# Patient Record
Sex: Male | Born: 1970 | Race: White | Hispanic: No | Smoking: Light tobacco smoker
Health system: Southern US, Community
[De-identification: ages and names within clinical notes are randomized; demographics above are authoritative.]

## PROBLEM LIST (undated history)

## (undated) DIAGNOSIS — R011 Cardiac murmur, unspecified: Secondary | ICD-10-CM

## (undated) DIAGNOSIS — I10 Essential (primary) hypertension: Secondary | ICD-10-CM

## (undated) DIAGNOSIS — E1165 Type 2 diabetes mellitus with hyperglycemia: Secondary | ICD-10-CM

## (undated) DIAGNOSIS — Z794 Long term (current) use of insulin: Secondary | ICD-10-CM

## (undated) DIAGNOSIS — M5416 Radiculopathy, lumbar region: Secondary | ICD-10-CM

## (undated) DIAGNOSIS — M79605 Pain in left leg: Secondary | ICD-10-CM

## (undated) DIAGNOSIS — R072 Precordial pain: Principal | ICD-10-CM

## (undated) DIAGNOSIS — M545 Low back pain, unspecified: Secondary | ICD-10-CM

## (undated) DIAGNOSIS — Z1211 Encounter for screening for malignant neoplasm of colon: Secondary | ICD-10-CM

## (undated) DIAGNOSIS — R0789 Other chest pain: Principal | ICD-10-CM

## (undated) DIAGNOSIS — E782 Mixed hyperlipidemia: Principal | ICD-10-CM

## (undated) DIAGNOSIS — E118 Type 2 diabetes mellitus with unspecified complications: Principal | ICD-10-CM

## (undated) DIAGNOSIS — N529 Male erectile dysfunction, unspecified: Secondary | ICD-10-CM

## (undated) DIAGNOSIS — I2510 Atherosclerotic heart disease of native coronary artery without angina pectoris: Principal | ICD-10-CM

## (undated) DIAGNOSIS — R9431 Abnormal electrocardiogram [ECG] [EKG]: Principal | ICD-10-CM

## (undated) DIAGNOSIS — I25119 Atherosclerotic heart disease of native coronary artery with unspecified angina pectoris: Secondary | ICD-10-CM

## (undated) DIAGNOSIS — R52 Pain, unspecified: Secondary | ICD-10-CM

## (undated) DIAGNOSIS — E1121 Type 2 diabetes mellitus with diabetic nephropathy: Principal | ICD-10-CM

## (undated) DIAGNOSIS — E139 Other specified diabetes mellitus without complications: Principal | ICD-10-CM

## (undated) DIAGNOSIS — D649 Anemia, unspecified: Secondary | ICD-10-CM

## (undated) HISTORY — DX: Cardiac murmur, unspecified: R01.1

## (undated) HISTORY — DX: Essential (primary) hypertension: I10

---

## 2006-05-04 ENCOUNTER — Ambulatory Visit: Payer: Self-pay | Admitting: Family Medicine

## 2006-05-04 DIAGNOSIS — E669 Obesity, unspecified: Secondary | ICD-10-CM | POA: Insufficient documentation

## 2006-05-04 DIAGNOSIS — I10 Essential (primary) hypertension: Secondary | ICD-10-CM | POA: Insufficient documentation

## 2006-05-05 ENCOUNTER — Encounter: Payer: Self-pay | Admitting: Family Medicine

## 2006-05-06 ENCOUNTER — Encounter: Payer: Self-pay | Admitting: Family Medicine

## 2006-05-06 LAB — CONVERTED CEMR LAB
BUN: 16 mg/dL (ref 6–23)
CO2: 24 meq/L (ref 19–32)
Calcium: 9.5 mg/dL (ref 8.4–10.5)
Chloride: 105 meq/L (ref 96–112)
Cholesterol: 200 mg/dL (ref 0–200)
Creatinine, Ser: 0.81 mg/dL (ref 0.40–1.50)
Glucose, Bld: 110 mg/dL — ABNORMAL HIGH (ref 70–99)
HDL: 29 mg/dL — ABNORMAL LOW (ref >39)
Total Bilirubin: 0.5 mg/dL (ref 0.3–1.2)
Total CHOL/HDL Ratio: 6.9
Triglycerides: 208 mg/dL — ABNORMAL HIGH (ref <150)
VLDL: 42 mg/dL — ABNORMAL HIGH (ref 0–40)

## 2006-05-23 ENCOUNTER — Encounter: Payer: Self-pay | Admitting: Family Medicine

## 2006-05-23 ENCOUNTER — Encounter: Admission: RE | Admit: 2006-05-23 | Discharge: 2006-08-21 | Payer: Self-pay | Admitting: Family Medicine

## 2006-08-24 ENCOUNTER — Ambulatory Visit: Payer: Self-pay | Admitting: Family Medicine

## 2006-08-24 DIAGNOSIS — H5710 Ocular pain, unspecified eye: Secondary | ICD-10-CM | POA: Insufficient documentation

## 2007-05-11 ENCOUNTER — Ambulatory Visit: Payer: Self-pay | Admitting: Family Medicine

## 2007-05-11 DIAGNOSIS — E1110 Type 2 diabetes mellitus with ketoacidosis without coma: Secondary | ICD-10-CM | POA: Insufficient documentation

## 2007-05-11 DIAGNOSIS — R7309 Other abnormal glucose: Secondary | ICD-10-CM | POA: Insufficient documentation

## 2007-05-12 ENCOUNTER — Ambulatory Visit: Payer: Self-pay | Admitting: Family Medicine

## 2007-05-12 DIAGNOSIS — R7301 Impaired fasting glucose: Secondary | ICD-10-CM | POA: Insufficient documentation

## 2007-05-12 LAB — CONVERTED CEMR LAB: Blood Glucose, Fasting: 109 mg/dL

## 2007-06-09 ENCOUNTER — Ambulatory Visit: Payer: Self-pay | Admitting: Family Medicine

## 2007-06-09 ENCOUNTER — Telehealth (INDEPENDENT_AMBULATORY_CARE_PROVIDER_SITE_OTHER): Payer: Self-pay | Admitting: *Deleted

## 2007-07-05 ENCOUNTER — Encounter: Admission: RE | Admit: 2007-07-05 | Discharge: 2007-07-05 | Payer: Self-pay | Admitting: Family Medicine

## 2007-09-01 ENCOUNTER — Ambulatory Visit: Payer: Self-pay | Admitting: Family Medicine

## 2007-09-01 LAB — CONVERTED CEMR LAB
Creatinine,U: 300 mg/dL
Hgb A1c MFr Bld: 5.8 %

## 2007-09-04 LAB — CONVERTED CEMR LAB
Cholesterol: 201 mg/dL — ABNORMAL HIGH (ref 0–200)
HDL goal, serum: 40 mg/dL
HDL: 32 mg/dL — ABNORMAL LOW (ref >39)
LDL Cholesterol: 124 mg/dL — ABNORMAL HIGH (ref 0–99)
LDL Goal: 130 mg/dL
Triglycerides: 223 mg/dL — ABNORMAL HIGH (ref <150)

## 2008-01-01 ENCOUNTER — Ambulatory Visit: Payer: Self-pay | Admitting: Family Medicine

## 2008-01-19 ENCOUNTER — Ambulatory Visit: Payer: Self-pay | Admitting: Family Medicine

## 2008-01-19 LAB — CONVERTED CEMR LAB: Blood Glucose, Fasting: 125 mg/dL

## 2008-02-15 ENCOUNTER — Ambulatory Visit: Payer: Self-pay | Admitting: Family Medicine

## 2010-07-27 ENCOUNTER — Encounter: Payer: Self-pay | Admitting: Family Medicine

## 2010-07-27 ENCOUNTER — Ambulatory Visit (INDEPENDENT_AMBULATORY_CARE_PROVIDER_SITE_OTHER): Payer: BC Managed Care – PPO | Admitting: Family Medicine

## 2010-07-27 DIAGNOSIS — E669 Obesity, unspecified: Secondary | ICD-10-CM

## 2010-07-27 DIAGNOSIS — I10 Essential (primary) hypertension: Secondary | ICD-10-CM

## 2010-07-27 DIAGNOSIS — IMO0001 Reserved for inherently not codable concepts without codable children: Secondary | ICD-10-CM

## 2010-07-27 DIAGNOSIS — R7309 Other abnormal glucose: Secondary | ICD-10-CM

## 2010-07-27 MED ORDER — METFORMIN HCL 500 MG PO TABS: 500.0000 mg | ORAL_TABLET | Freq: Two times a day (BID) | ORAL | Status: DC

## 2010-07-27 MED ORDER — LISINOPRIL-HYDROCHLOROTHIAZIDE 20-25 MG PO TABS: 1.0000 | ORAL_TABLET | Freq: Every day | ORAL | Status: DC

## 2010-07-27 NOTE — Assessment & Plan Note (Signed)
  We discussed the importance of weight loss on his sugars and blood pressure. I encouraged healthy diet and exercise.

## 2010-07-27 NOTE — Assessment & Plan Note (Signed)
Not at goal. Discussed restarting his medication. He didn't have any SE on the previsou med. Restart and recheck in 1 mo. Lap slip given to check renal function.

## 2010-07-27 NOTE — Assessment & Plan Note (Addendum)
Discussed new dx. H.O given. Will sched for diabetic classes. Discussed diet, exercise, weight loss. F/U in one mo with glucometer. Check sugar in AM daily before breakfast. He has meter, etc. Will start metformin. Check monofilament and foot exam at next visit. Can give pneumococcal at next ov.

## 2010-07-27 NOTE — Progress Notes (Signed)
  Subjective:    Patient ID: Larry Lawson, male    DOB: [birth year], 40 y.o.   MRN: [id number]  HPI He is here to follow up. Hasn't been seen since 2009. He was pre-diabetic at the time. He was supposed to f/u in 2 months and never did. Has been out of his BP med for a long time. No CP, SOB, palps.  He has had some increased thirst and urination.  He had labs check for insurance about 2 weeks ago and and was told his sugar was high. He started checking it at home and it was in the 200-300 range. Says has been eating a lot of ice cream lately.  He is also overweight. He is taking phentermine on and off.   Review of Systems     Objective:   Physical Exam  Constitutional: He is oriented to person, place, and time. He appears well-developed and well-nourished.  HENT:  Head: Normocephalic and atraumatic.  Eyes: Conjunctivae are normal. Pupils are equal, round, and reactive to light.  Neck: Neck supple. No thyromegaly present.  Cardiovascular: Normal rate, regular rhythm and normal heart sounds.        No carotid bruits.   Pulmonary/Chest: Effort normal and breath sounds normal.  Musculoskeletal: He exhibits no edema.  Lymphadenopathy:    He has no cervical adenopathy.  Neurological: He is alert and oriented to person, place, and time.  Skin: Skin is warm and dry.  Psychiatric: He has a normal mood and affect.          Assessment & Plan:

## 2010-08-23 ENCOUNTER — Encounter: Payer: Self-pay | Admitting: Family Medicine

## 2010-08-24 ENCOUNTER — Ambulatory Visit (INDEPENDENT_AMBULATORY_CARE_PROVIDER_SITE_OTHER): Payer: BC Managed Care – PPO | Admitting: Family Medicine

## 2010-08-24 ENCOUNTER — Encounter: Payer: Self-pay | Admitting: Family Medicine

## 2010-08-24 DIAGNOSIS — I10 Essential (primary) hypertension: Secondary | ICD-10-CM

## 2010-08-24 DIAGNOSIS — IMO0001 Reserved for inherently not codable concepts without codable children: Secondary | ICD-10-CM

## 2010-08-24 MED ORDER — SITAGLIP PHOS-METFORMIN HCL ER 100-1000 MG PO TB24: 100.0000 mg | ORAL_TABLET | Freq: Every day | ORAL | Status: DC

## 2010-08-24 MED ORDER — PNEUMOCOCCAL VAC POLYVALENT 25 MCG/0.5ML IJ INJ
0.5000 mL | INJECTION | Freq: Once | INTRAMUSCULAR | Status: AC
  Administered 2010-08-24: 0.5 mL via INTRAMUSCULAR

## 2010-08-24 MED ORDER — AMBULATORY NON FORMULARY MEDICATION: Status: DC

## 2010-08-24 NOTE — Assessment & Plan Note (Signed)
Improved today.  DBP not at goal.  Work on weight loss and diet and let recheck in 2 months. If still not at goal will need to adjust his medication.

## 2010-08-24 NOTE — Progress Notes (Signed)
  Subjective:    Patient ID: TAHJI Markham, male    DOB: [birth year], 40 y.o.   MRN: [id number]  Diabetes He presents for his follow-up diabetic visit. He has type 2 diabetes mellitus. Associated symptoms include fatigue. Pertinent negatives for diabetes include no polydipsia, no polyuria and no weight loss. There are no hypoglycemic complications. Symptoms are stable. When asked about current treatments, none were reported. He is compliant with treatment most of the time. His weight is stable. He has had a previous visit with a dietician. 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 ) participates in exercise. His breakfast blood glucose range is generally >200 mg/dl. An ACE inhibitor/angiotensin II receptor blocker is being taken.  Hypertension    He did report inc fatigue when his sugars were higher.   Review of Systems  Constitutional: Positive for fatigue. Negative for weight loss.  Genitourinary: Negative for polyuria.  Hematological: Negative for polydipsia.       Objective:   Physical Exam  Constitutional: He appears well-developed and well-nourished.  HENT:  Head: Normocephalic and atraumatic.  Cardiovascular: Normal rate, regular rhythm and normal heart sounds.   Pulmonary/Chest: Effort normal and breath sounds normal.  Skin: Skin is warm and dry.  Psychiatric: He has a normal mood and affect.          Assessment & Plan:

## 2010-08-24 NOTE — Assessment & Plan Note (Signed)
Given rx for glucometer and strips.  Will inc metformin to 1000 and add [place]. Given coupon card. F/u in 2 months. Monitor for inc diarrhea. Make sure taking with food.  He has missed some dosing because it is bid so I think the XR will help him.  Monofilament exam is nl.  Given pneumonia vaccine today.

## 2010-08-24 NOTE — Progress Notes (Signed)
Addended by: Burna Mortimer E on: 08/24/2010 10:04 AM   Modules accepted: Orders

## 2010-10-26 ENCOUNTER — Encounter: Payer: Self-pay | Admitting: Family Medicine

## 2010-10-26 ENCOUNTER — Ambulatory Visit (INDEPENDENT_AMBULATORY_CARE_PROVIDER_SITE_OTHER): Payer: BC Managed Care – PPO | Admitting: Family Medicine

## 2010-10-26 DIAGNOSIS — E119 Type 2 diabetes mellitus without complications: Secondary | ICD-10-CM

## 2010-10-26 DIAGNOSIS — IMO0001 Reserved for inherently not codable concepts without codable children: Secondary | ICD-10-CM

## 2010-10-26 LAB — POCT GLYCOSYLATED HEMOGLOBIN (HGB A1C): Hemoglobin A1C: 8.2

## 2010-10-26 MED ORDER — AMBULATORY NON FORMULARY MEDICATION: Status: DC

## 2010-10-26 NOTE — Assessment & Plan Note (Signed)
Sig improved on the medication esp since didn't make any major dietary changes. Discussed continue current regime and really focus on exercise, diet and weight loss in the next 3 months. F/U in 3 months.

## 2010-10-26 NOTE — Patient Instructions (Signed)
Continue to work on diet, exercise and weight loss.

## 2010-10-26 NOTE — Progress Notes (Signed)
  Subjective:    Patient ID: Larry Lawson, male    DOB: [birth year], 41 y.o.   MRN: [id number]  HPI DM - Has had more beer than usual as has  Been working out of town.  Has tried to cut back on sugar drinks, but he hasn't really made any major dietary changes.  He is taking the Jainumet but says it is very expensive. He lost the coupon card I gave him..they called him about the diabetic classes.  Did go to the class her in the building.    He says it was a one half hour class and felt like he was somewhat helpful. He says he will be traveling less over the next couple months and plans on starting a more active regimen to help control his blood sugars and get more exercise.   HTN- BP looks great. He says is only takting the lisinopril and not the toprol.   Review of Systems     Objective:   Physical Exam  Constitutional: He is oriented to person, place, and time. He appears well-developed and well-nourished.  Neck: Neck supple. No thyromegaly present.  Cardiovascular: Normal rate, regular rhythm and normal heart sounds.   Pulmonary/Chest: Effort normal and breath sounds normal.  Lymphadenopathy:    He has no cervical adenopathy.  Neurological: He is alert and oriented to person, place, and time.  Skin: Skin is warm and dry.  Psychiatric: He has a normal mood and affect. His behavior is normal.          Assessment & Plan: Patient was actually given a pneumococcal vaccine. He is alert he had this. I reassured her that there should be no negative effects from this. Also he will not be charged.   Note the patient was accidentally given another pneumonia vaccine today. He is already up to date. I reassured him there should be no negative effects from this. He also should not be charged for this.

## 2011-01-25 ENCOUNTER — Ambulatory Visit (INDEPENDENT_AMBULATORY_CARE_PROVIDER_SITE_OTHER): Payer: BC Managed Care – PPO | Admitting: Family Medicine

## 2011-01-25 ENCOUNTER — Encounter: Payer: Self-pay | Admitting: Family Medicine

## 2011-01-25 ENCOUNTER — Telehealth: Payer: Self-pay | Admitting: Family Medicine

## 2011-01-25 DIAGNOSIS — E119 Type 2 diabetes mellitus without complications: Secondary | ICD-10-CM

## 2011-01-25 DIAGNOSIS — F39 Unspecified mood [affective] disorder: Secondary | ICD-10-CM

## 2011-01-25 DIAGNOSIS — I10 Essential (primary) hypertension: Secondary | ICD-10-CM

## 2011-01-25 LAB — POCT UA - MICROALBUMIN

## 2011-01-25 LAB — POCT GLYCOSYLATED HEMOGLOBIN (HGB A1C): Hemoglobin A1C: 9.1

## 2011-01-25 MED ORDER — CIPROFLOXACIN HCL 0.3 % OP SOLN: 1.0000 [drp] | OPHTHALMIC | Status: AC

## 2011-01-25 MED ORDER — METOPROLOL SUCCINATE ER 25 MG PO TB24: 25.0000 mg | ORAL_TABLET | Freq: Every day | ORAL | Status: DC

## 2011-01-25 NOTE — Telephone Encounter (Signed)
Call patient in see if he would be okay with me calling a medication for his mood. If we do this and I'll need to see him back in a month to make sure it's helping him and to adjust his dose if needed. I meant to give him a mood questionnaire and PHQ-9 and forgot. We can certainly mail this to him if he would like to fill it out and drop it off.

## 2011-01-25 NOTE — Progress Notes (Signed)
  Subjective:    Patient ID: Larry Lawson, male    DOB: 08/10/1970, 40 y.o.   MRN: [id number]  Diabetes He presents for his follow-up diabetic visit. He has type 2 diabetes mellitus. His disease course has been worsening. There are no hypoglycemic associated symptoms. Pertinent negatives for diabetes include no chest pain, no foot paresthesias, no foot ulcerations, no polydipsia, no polyphagia, no polyuria, no visual change and no weight loss. When asked about current treatments, none were reported. He is compliant with treatment most of the time. He is following a generally unhealthy diet. He rarely participates in exercise. His home blood glucose trend is increasing steadily.  Hypertension This is a chronic problem. The current episode started more than 1 year ago. The problem is uncontrolled. Pertinent negatives include no chest pain. Past treatments include diuretics and ACE inhibitors. Compliance problems include diet.    He is also concerned about his mood today. He has been more able and angry. He most wonders if he could be bipolar. He has never been formally diagnosed with this.   Review of Systems  Constitutional: Negative for weight loss.  Cardiovascular: Negative for chest pain.  Genitourinary: Negative for polyuria.  Hematological: Negative for polydipsia and polyphagia.       Objective:   Physical Exam  Constitutional: He is oriented to person, place, and time. He appears well-developed and well-nourished.  HENT:  Head: Normocephalic and atraumatic.  Eyes: Conjunctivae are normal. Pupils are equal, round, and reactive to light.  Cardiovascular: Normal rate, regular rhythm and normal heart sounds.   Pulmonary/Chest: Effort normal and breath sounds normal.  Neurological: He is alert and oriented to person, place, and time.  Skin: Skin is warm and dry.  Psychiatric: He has a normal mood and affect. His behavior is normal.          Assessment & Plan:  DM- I really  wanted to add insulin to his regimen but he really didn't want to do this. He wants 3 months to really work on exercise and diet.  Normal Urine micro. I reviewed the consequences of poorly controlled sugars Lab Results  Component Value Date   HGBA1C 8.2 10/26/2010   HTN - Not well controlled. Will add metoprolol to his regimen. F/U in 3 months.    Mood disorder, unspecified-I would like to start by getting him in for counseling. We can also work on getting him in with psychiatry for further evaluation.

## 2011-01-25 NOTE — Patient Instructions (Signed)
[email or web address] Really work on exercise for 30 min for 5 days per week.

## 2011-01-26 LAB — LIPID PANEL
Cholesterol: 175 mg/dL (ref 0–200)
HDL: 27 mg/dL — ABNORMAL LOW (ref >39)
Total CHOL/HDL Ratio: 6.5 Ratio
Triglycerides: 343 mg/dL — ABNORMAL HIGH (ref <150)

## 2011-01-27 LAB — COMPLETE METABOLIC PANEL WITH GFR
ALT: 19 U/L (ref 0–53)
AST: 17 U/L (ref 0–37)
Alkaline Phosphatase: 63 U/L (ref 39–117)
Creat: 0.99 mg/dL (ref 0.50–1.35)
Total Bilirubin: 0.5 mg/dL (ref 0.3–1.2)

## 2011-02-03 NOTE — Telephone Encounter (Signed)
Left message for pt to call back  °

## 2011-02-16 ENCOUNTER — Ambulatory Visit (HOSPITAL_COMMUNITY): Payer: BC Managed Care – PPO | Admitting: Behavioral Health

## 2011-03-10 NOTE — Telephone Encounter (Signed)
Left message to call back  

## 2011-03-17 ENCOUNTER — Ambulatory Visit (INDEPENDENT_AMBULATORY_CARE_PROVIDER_SITE_OTHER): Payer: BC Managed Care – PPO | Admitting: Family Medicine

## 2011-03-17 ENCOUNTER — Other Ambulatory Visit: Payer: Self-pay | Admitting: *Deleted

## 2011-03-17 DIAGNOSIS — F329 Major depressive disorder, single episode, unspecified: Secondary | ICD-10-CM

## 2011-03-17 DIAGNOSIS — F3289 Other specified depressive episodes: Secondary | ICD-10-CM

## 2011-03-17 DIAGNOSIS — F32A Depression, unspecified: Secondary | ICD-10-CM | POA: Insufficient documentation

## 2011-03-17 DIAGNOSIS — R0789 Other chest pain: Secondary | ICD-10-CM

## 2011-03-17 MED ORDER — LISINOPRIL-HYDROCHLOROTHIAZIDE 20-25 MG PO TABS: 1.0000 | ORAL_TABLET | Freq: Every day | ORAL | Status: DC

## 2011-03-17 MED ORDER — FLUOXETINE HCL 10 MG PO CAPS: ORAL_CAPSULE | ORAL | Status: DC

## 2011-03-17 NOTE — Telephone Encounter (Signed)
Pt came in the office today and he filled out PHQ9 and is prob ok with being on medication

## 2011-03-17 NOTE — Progress Notes (Addendum)
Patient ID: Larry Lawson, male   DOB: June 11, 1970, 41 y.o.   MRN: [id number] Pt walked in stating his BP was elevated and he was having mild chest pain. After discussing medications, we realized that Pt was confused and has only been taking Metoprolol and stopped lisinopril. I advised Pt to restart lisinopril and continue all other meds. Pt agreed. BP was elevated today. EKG was normal. Pt advised to keep upcoming f/u apt and if chest pain or BP does not improve call our office or to ED. Pt agreed.     HTN- Not well controlled. Take both meds adn keep f/u Atypical CP- EKG shows rate of 73 bpm, NSR, no acute changes  Depression - PHQ- 9 score of 11, no suicidal thoughts.  I strongly recommend starting a med for depression as we had discussed at last office visit. Will start fluoxetins and keep f/u.   LMOM informing Pt

## 2011-04-02 ENCOUNTER — Encounter: Payer: Self-pay | Admitting: *Deleted

## 2011-04-21 ENCOUNTER — Encounter: Payer: Self-pay | Admitting: *Deleted

## 2011-04-22 ENCOUNTER — Encounter: Payer: Self-pay | Admitting: *Deleted

## 2011-04-22 ENCOUNTER — Emergency Department (INDEPENDENT_AMBULATORY_CARE_PROVIDER_SITE_OTHER)
Admission: EM | Admit: 2011-04-22 | Discharge: 2011-04-22 | Disposition: A | Discharge status: Home / Self Care | Payer: BC Managed Care – PPO | Source: Home / Self Care | Attending: Family Medicine | Admitting: Family Medicine

## 2011-04-22 DIAGNOSIS — J069 Acute upper respiratory infection, unspecified: Secondary | ICD-10-CM

## 2011-04-22 MED ORDER — BENZONATATE 200 MG PO CAPS: 200.0000 mg | ORAL_CAPSULE | Freq: Every day | ORAL | Status: AC

## 2011-04-22 MED ORDER — DOXYCYCLINE HYCLATE 100 MG PO TABS: 100.0000 mg | ORAL_TABLET | Freq: Two times a day (BID) | ORAL | Status: AC

## 2011-04-22 NOTE — ED Notes (Signed)
Patient c/o dry cough and congestion x 5 days. Denies fever. Taken motrin and sinus multi-symptom OTC.

## 2011-04-22 NOTE — Discharge Instructions (Signed)
Take plain Mucinex (guaifenesin) twice daily for cough and congestion.  Increase fluid intake, rest. May use Afrin nasal spray (or generic oxymetazoline) twice daily for about 5 days.  Also recommend using saline nasal spray several times daily and saline nasal irrigation (AYR is a common brand) Stop all antihistamines for now, and other non-prescription cough/cold preparations.     

## 2011-04-22 NOTE — ED Provider Notes (Signed)
History     CSN: [id number]  Arrival date & time 04/22/11  1709   First MD Initiated Contact with Patient 04/22/11 1800      Chief Complaint  Patient presents with  . Cough  . Nasal Congestion     HPI Comments: Patient complains of approximately 5 day history of gradually progressive URI symptoms beginning with a mild sore throat (now improved), followed by progressive nasal congestion and a cough. Complains of fatigue and initial myalgias.  Cough is now worse at night and generally non-productive during the day.  There has been no pleuritic pain, shortness of breath, or wheezes.   The history is provided by the patient.    Past Medical History  Diagnosis Date  . Heart murmur   . Heart murmur   . Hypertension   . Diabetes mellitus     History reviewed. No pertinent past surgical history.  Family History  Problem Relation Age of Onset  . Cancer      grandmother  . Diabetes Brother   . Hyperlipidemia Mother   . Hypertension Father   . Heart attack      grandfather  . Prostate cancer Father     History  Substance Use Topics  . Smoking status: Former Games developer  . Smokeless tobacco: Not on file  . Alcohol Use: Yes      Review of Systems + sore throat + cough No pleuritic pain No wheezing + nasal congestion + post-nasal drainage No sinus pain/pressure No itchy/red eyes No earache No hemoptysis No SOB No fever/chills No nausea No vomiting No abdominal pain No diarrhea No urinary symptoms No skin rashes + fatigue No myalgias No headache Used OTC meds without relief  Allergies  Sulfonamide derivatives  Home Medications   Current Outpatient Rx  Name Route Sig Dispense Refill  . AMBULATORY NON FORMULARY MEDICATION  Medication Name: Glucometer with strips and lancets to check daily.  Dx: Diabetes 250.00 1 Units 3  . BENZONATATE 200 MG PO CAPS Oral Take 1 capsule (200 mg total) by mouth at bedtime. Take as needed for cough 12 capsule 0  . DOXYCYCLINE  HYCLATE 100 MG PO TABS Oral Take 1 tablet (100 mg total) by mouth 2 (two) times daily. 20 tablet 0  . FLUOXETINE HCL 10 MG PO CAPS  1 po QD for 1 wk, then inc to 2 tabs daily 60 capsule 0  . LISINOPRIL-HYDROCHLOROTHIAZIDE 20-25 MG PO TABS Oral Take 1 tablet by mouth daily. 90 tablet 1  . METOPROLOL SUCCINATE ER 25 MG PO TB24 Oral Take 1 tablet (25 mg total) by mouth daily. 30 tablet 2  . SITAGLIPTIN-METFORMIN HCL ER [phone] MG PO TB24 Oral Take 100 mg by mouth daily. 30 tablet 6    BP 148/102  Temp(Src) 98.4 F (36.9 C) (Oral)  Resp 14  Ht 5\' 8"  (1.727 m)  Wt 233 lb (105.688 kg)  BMI 35.43 kg/m2  SpO2 95%  Physical Exam Nursing notes and Vital Signs reviewed. Appearance:  Patient appears healthy, stated age, and in no acute distress Eyes:  Pupils are equal, round, and reactive to light and accomodation.  Extraocular movement is intact.  Conjunctivae are not inflamed  Ears:  Canals normal.  Tympanic membranes normal.  Nose:  Mildly congested turbinates.  No sinus tenderness  Pharynx:  Normal Neck:  Supple.  Slightly tender shotty posterior nodes are palpated bilaterally  Lungs:  Few faint wheezes heard.  Breath sounds are equal.  Heart:  Regular rate and  rhythm without murmurs, rubs, or gallops.  Abdomen:  Nontender without masses or hepatosplenomegaly.  Bowel sounds are present.  No CVA or flank tenderness.  Extremities:  No edema.  No calf tenderness Skin:  No rash present.   ED Course  Procedures  none      1. Acute upper respiratory infections of unspecified site       MDM  ? Bronchitis Begin doxycycline, and Tessalon at bedtime Take plain Mucinex (guaifenesin) twice daily for cough and congestion.  Increase fluid intake, rest. May use Afrin nasal spray (or generic oxymetazoline) twice daily for about 5 days.  Also recommend using saline nasal spray several times daily and saline nasal irrigation (AYR is a common brand) Stop all antihistamines for now, and other  non-prescription cough/cold preparations. Followup with PCP if not improving.        Donna Christen, MD 04/24/11 2026

## 2011-04-27 ENCOUNTER — Ambulatory Visit: Payer: BC Managed Care – PPO | Admitting: Family Medicine

## 2011-04-27 DIAGNOSIS — Z0289 Encounter for other administrative examinations: Secondary | ICD-10-CM

## 2011-05-20 ENCOUNTER — Ambulatory Visit: Payer: BC Managed Care – PPO | Admitting: Family Medicine

## 2011-05-21 ENCOUNTER — Ambulatory Visit: Payer: BC Managed Care – PPO | Admitting: Family Medicine

## 2011-05-21 ENCOUNTER — Encounter: Payer: Self-pay | Admitting: Family Medicine

## 2011-05-21 ENCOUNTER — Ambulatory Visit (INDEPENDENT_AMBULATORY_CARE_PROVIDER_SITE_OTHER): Payer: BC Managed Care – PPO | Admitting: Family Medicine

## 2011-05-21 VITALS — BP 158/90 | HR 104 | Temp 98.5°F | Ht 68.25 in | Wt 233.0 lb

## 2011-05-21 DIAGNOSIS — J40 Bronchitis, not specified as acute or chronic: Secondary | ICD-10-CM

## 2011-05-21 DIAGNOSIS — I10 Essential (primary) hypertension: Secondary | ICD-10-CM

## 2011-05-21 MED ORDER — SITAGLIP PHOS-METFORMIN HCL ER 100-1000 MG PO TB24: 100.0000 mg | ORAL_TABLET | Freq: Every day | ORAL | Status: DC

## 2011-05-21 MED ORDER — ALBUTEROL SULFATE HFA 108 (90 BASE) MCG/ACT IN AERS: 2.0000 | INHALATION_SPRAY | Freq: Four times a day (QID) | RESPIRATORY_TRACT | Status: DC | PRN

## 2011-05-21 MED ORDER — METOPROLOL SUCCINATE ER 25 MG PO TB24: 25.0000 mg | ORAL_TABLET | Freq: Every day | ORAL | Status: DC

## 2011-05-21 MED ORDER — LISINOPRIL-HYDROCHLOROTHIAZIDE 20-25 MG PO TABS: 1.0000 | ORAL_TABLET | Freq: Every day | ORAL | Status: DC

## 2011-05-21 MED ORDER — BENZONATATE 200 MG PO CAPS: 200.0000 mg | ORAL_CAPSULE | Freq: Three times a day (TID) | ORAL | Status: DC | PRN

## 2011-05-21 MED ORDER — BENZONATATE 200 MG PO CAPS: 200.0000 mg | ORAL_CAPSULE | Freq: Three times a day (TID) | ORAL | Status: AC | PRN

## 2011-05-21 NOTE — Patient Instructions (Signed)

## 2011-05-21 NOTE — Progress Notes (Signed)
  Subjective:    Patient ID: Larry Lawson, male    DOB: Feb 26, 1971, 41 y.o.   MRN: [id number]  HPI Was sick 4 weeks ago and went to [facility] and given ABS.  Starting feeling sick about a week ago. No fever.  Cough is productive and occ dry.  Taking plain mucinex.  Using Affrin. Dry nose. No nasal congestion, or facial pressure. No HA.  Mild ST after coughing.  Has taken BP meds for 2 weeks, so BP high today.  Cough is keeping him up at night.  Sides are hurting from coughing. +smoker   Review of Systems     Objective:   Physical Exam  Constitutional: He is oriented to person, place, and time. He appears well-developed and well-nourished.  HENT:  Head: Normocephalic and atraumatic.  Right Ear: External ear normal.  Left Ear: External ear normal.  Nose: Nose normal.  Mouth/Throat: Oropharynx is clear and moist.       TMs and canals are clear.   Eyes: Conjunctivae and EOM are normal. Pupils are equal, round, and reactive to light.  Neck: Neck supple. No thyromegaly present.  Cardiovascular: Normal rate and normal heart sounds.   Pulmonary/Chest: Effort normal and breath sounds normal.  Lymphadenopathy:    He has no cervical adenopathy.  Neurological: He is alert and oriented to person, place, and time.  Skin: Skin is warm and dry.  Psychiatric: He has a normal mood and affect.          Assessment & Plan:  Bronchitis- will discuss this is most likely viral. I recommend using an inhaler and a cough medicine at bedtime. Call if not better in one to 2 weeks. He has no signs of sinusitis today. No abnormal lung exam to suggest pneumonia. His oxygen level is normal and is not having a fever.  Hypertension-not well controlled today. I did ask him to restart his blood pressure medications since he has now presented himself it is not causing his symptoms.  Diabetes-I reminded him that he is due for a diabetic check. I asked him to  schedule this in the next month if possible.

## 2011-06-11 ENCOUNTER — Encounter: Payer: Self-pay | Admitting: *Deleted

## 2011-06-15 ENCOUNTER — Encounter: Payer: Self-pay | Admitting: Family Medicine

## 2011-06-15 ENCOUNTER — Ambulatory Visit (INDEPENDENT_AMBULATORY_CARE_PROVIDER_SITE_OTHER): Payer: BC Managed Care – PPO | Admitting: Family Medicine

## 2011-06-15 VITALS — BP 155/92 | HR 97 | Ht 68.25 in | Wt 233.0 lb

## 2011-06-15 DIAGNOSIS — J309 Allergic rhinitis, unspecified: Secondary | ICD-10-CM

## 2011-06-15 DIAGNOSIS — J329 Chronic sinusitis, unspecified: Secondary | ICD-10-CM

## 2011-06-15 DIAGNOSIS — I10 Essential (primary) hypertension: Secondary | ICD-10-CM

## 2011-06-15 DIAGNOSIS — J40 Bronchitis, not specified as acute or chronic: Secondary | ICD-10-CM

## 2011-06-15 MED ORDER — BENZONATATE 200 MG PO CAPS
200.0000 mg | ORAL_CAPSULE | Freq: Three times a day (TID) | ORAL | Status: AC | PRN
Start: 2011-06-15 — End: 2011-06-22

## 2011-06-15 MED ORDER — LOSARTAN POTASSIUM-HCTZ 100-25 MG PO TABS: 1.0000 | ORAL_TABLET | Freq: Every day | ORAL | Status: DC

## 2011-06-15 MED ORDER — ALBUTEROL SULFATE HFA 108 (90 BASE) MCG/ACT IN AERS: 2.0000 | INHALATION_SPRAY | Freq: Four times a day (QID) | RESPIRATORY_TRACT | Status: DC | PRN

## 2011-06-15 MED ORDER — AZITHROMYCIN 250 MG PO TABS: ORAL_TABLET | ORAL | Status: AC

## 2011-06-15 NOTE — Progress Notes (Signed)
  Subjective:    Patient ID: Larry Lawson, male    DOB: 1970/10/04, 41 y.o.   MRN: [id number]  HPI Here for bronchitis 2 weeks ago and says really just hasn't gotten better. At this point he has had cough for almost a month. He stopped his blood pressure pill 2 weeks prior to when he came in last time. But he continued to cough and get worse status and to restart his lisinopril. He did restart it. He wonders if it could still be aggravating his symptoms. At the last office visit his symptoms are primarily in his chest. He had no nasal symptoms at the time. Woke up 2 days ago with sever nasal congestion. + HA.  Mild ST esp at night.  Nose has been really dry.  Mild facial pain under his eyes. He is not taking any medications at this point time. Has not tried any antihistamines. He says he is continuing to use his inhaler as needed. He did feel that the [employer] that he got at the emergency room her helpful for him and would like a refill on those.   Review of Systems     Objective:   Physical Exam  Constitutional: He is oriented to person, place, and time. He appears well-developed and well-nourished.  HENT:  Head: Normocephalic and atraumatic.  Right Ear: External ear normal.  Left Ear: External ear normal.  Nose: Nose normal.  Mouth/Throat: Oropharynx is clear and moist.       TMs and canals are clear. Left turbinate is very swollen.  Eyes: Conjunctivae and EOM are normal. Pupils are equal, round, and reactive to light.  Neck: Neck supple. No thyromegaly present.  Cardiovascular: Normal rate and normal heart sounds.   Pulmonary/Chest: Effort normal and breath sounds normal.  Lymphadenopathy:    He has no cervical adenopathy.  Neurological: He is alert and oriented to person, place, and time.  Skin: Skin is warm and dry.  Psychiatric: He has a normal mood and affect.          Assessment & Plan:  Sinusitis/bronchitis-because he thinks it for so long at this point we'll go  ahead and put him on azithromycin. I also want him to start either Allegra or 0 tack to help control any allergy symptoms. It is not significantly better in about 10 days then please call the office back. I want him to stay away from decongestants because his blood pressures aren't high and not well controlled.  Cough - will change ACEi to ARB to make sure it is not an ACE inhibitor cough.  Hypertension-we'll change him to an ARB with diuretic. Recheck blood pressure followup in 3-4 weeks. I suspect he may have an ACE inhibitor related cough.  Diabetes-carcinoma followup in 3 or 4 weeks for diabetic checkup. He is well overdue. He declined to do it today.

## 2011-06-15 NOTE — Patient Instructions (Addendum)
Get some Allegra or zyrtec once a day version.  Avoid the D version because of your blood pressure.  Start you new blood pressure pill in place of your lisinopril hct.      Sinusitis Sinuses are air pockets within the bones of your face. The growth of bacteria within a sinus leads to infection. The infection prevents the sinuses from draining. This infection is called sinusitis. SYMPTOMS   There will be different areas of pain depending on which sinuses have become infected.  The maxillary sinuses often produce pain beneath the eyes.   Frontal sinusitis may cause pain in the middle of the forehead and above the eyes.  Other problems (symptoms) include:  Toothaches.   Colored, pus-like (purulent) drainage from the nose.   Swelling, warmth, and tenderness over the sinus areas may be signs of infection.  TREATMENT   Sinusitis is most often determined by an exam.X-rays may be taken. If x-rays have been taken, make sure you obtain your results or find out how you are to obtain them. Your caregiver may give you medications (antibiotics). These are medications that will help kill the bacteria causing the infection. You may also be given a medication (decongestant) that helps to reduce sinus swelling.   HOME CARE INSTRUCTIONS    Only take over-the-counter or prescription medicines for pain, discomfort, or fever as directed by your caregiver.   Drink extra fluids. Fluids help thin the mucus so your sinuses can drain more easily.   Applying either moist heat or ice packs to the sinus areas may help relieve discomfort.   Use saline nasal sprays to help moisten your sinuses. The sprays can be found at your local drugstore.  SEEK IMMEDIATE MEDICAL CARE IF:  You have a fever.   You have increasing pain, severe headaches, or toothache.   You have nausea, vomiting, or drowsiness.   You develop unusual swelling around the face or trouble seeing.  MAKE SURE YOU:    Understand these  instructions.   Will watch your condition.   Will get help right away if you are not doing well or get worse.  Document Released: 02/22/2005 Document Revised: 02/11/2011 Document Reviewed: 09/21/2006 [facility] Patient Information 2012 [place], [place].

## 2011-07-08 ENCOUNTER — Ambulatory Visit: Payer: BC Managed Care – PPO | Admitting: Family Medicine

## 2011-07-08 DIAGNOSIS — Z0289 Encounter for other administrative examinations: Secondary | ICD-10-CM

## 2011-07-12 ENCOUNTER — Encounter: Payer: Self-pay | Admitting: Emergency Medicine

## 2011-07-12 ENCOUNTER — Emergency Department
Admit: 2011-07-12 | Discharge: 2011-07-12 | Disposition: A | Discharge status: Home / Self Care | Payer: BC Managed Care – PPO

## 2011-07-12 ENCOUNTER — Emergency Department (INDEPENDENT_AMBULATORY_CARE_PROVIDER_SITE_OTHER)
Admission: EM | Admit: 2011-07-12 | Discharge: 2011-07-12 | Disposition: A | Discharge status: Home / Self Care | Payer: BC Managed Care – PPO | Source: Home / Self Care | Attending: Family Medicine | Admitting: Family Medicine

## 2011-07-12 DIAGNOSIS — R05 Cough: Secondary | ICD-10-CM

## 2011-07-12 DIAGNOSIS — R079 Chest pain, unspecified: Secondary | ICD-10-CM

## 2011-07-12 DIAGNOSIS — R059 Cough, unspecified: Secondary | ICD-10-CM

## 2011-07-12 DIAGNOSIS — K219 Gastro-esophageal reflux disease without esophagitis: Secondary | ICD-10-CM

## 2011-07-12 DIAGNOSIS — R053 Chronic cough: Secondary | ICD-10-CM

## 2011-07-12 DIAGNOSIS — Z111 Encounter for screening for respiratory tuberculosis: Secondary | ICD-10-CM

## 2011-07-12 DIAGNOSIS — R0781 Pleurodynia: Secondary | ICD-10-CM

## 2011-07-12 MED ORDER — TUBERCULIN PPD 5 UNIT/0.1ML ID SOLN
5.0000 [IU] | Freq: Once | INTRADERMAL | Status: AC
  Administered 2011-07-12: 5 [IU] via INTRADERMAL

## 2011-07-12 MED ORDER — OMEPRAZOLE 20 MG PO CPDR: 20.0000 mg | DELAYED_RELEASE_CAPSULE | Freq: Every day | ORAL | Status: DC

## 2011-07-12 MED ORDER — ACETAMINOPHEN-CODEINE #3 300-30 MG PO TABS: ORAL_TABLET | ORAL | Status: DC

## 2011-07-12 NOTE — Discharge Instructions (Signed)

## 2011-07-12 NOTE — ED Notes (Signed)
Left rib pain x 1 month worse x 4 days

## 2011-07-12 NOTE — ED Provider Notes (Signed)
History     CSN: [id number]  Arrival date & time 07/12/11  1115   First MD Initiated Contact with Patient 07/12/11 1145      Chief Complaint  Patient presents with  . Chest Pain      HPI Comments: Patient complains of persistent non-productive cough for at least a month, generally worse after eating.  He states that he awakens every night about 4AM coughing.  Three days ago while coughing early in the morning he felt a sudden popping sensation in his left lateral chest and has had persistent pain in that location since.  The pain is worse with inspiration, cough, and movement.  No shortness of breath.  He states that he has had night sweats for 3 to 4 weeks.  He notes that he recently resumed smoking. He recalls seeing black stools several weeks ago, resolved.  Patient is a 40 y.o. male presenting with cough. The history is provided by the patient.  Cough Episode onset: one month ago. The problem occurs hourly. The problem has been gradually worsening. The cough is non-productive. There has been no fever. Associated symptoms include chest pain, chills and sweats. Pertinent negatives include no weight loss, no ear congestion, no ear pain, no headaches, no rhinorrhea, no sore throat, no myalgias, no shortness of breath, no wheezing and no eye redness. Treatments tried: Z-pack. The treatment provided mild relief. He is a smoker.    Past Medical History  Diagnosis Date  . Heart murmur   . Heart murmur   . Hypertension   . Diabetes mellitus     History reviewed. No pertinent past surgical history.  Family History  Problem Relation Age of Onset  . Cancer      grandmother  . Diabetes Brother   . Hyperlipidemia Mother   . Hypertension Father   . Prostate cancer Father   . Heart attack      grandfather    History  Substance Use Topics  . Smoking status: Current Everyday Smoker -- 0.5 packs/day  . Smokeless tobacco: Current User  . Alcohol Use: Yes      Review of Systems    Constitutional: Positive for chills. Negative for weight loss.  HENT: Negative for ear pain, sore throat and rhinorrhea.   Eyes: Negative for redness.  Respiratory: Positive for cough. Negative for shortness of breath and wheezing.   Cardiovascular: Positive for chest pain.  Musculoskeletal: Negative for myalgias.  Neurological: Negative for headaches.  All other systems reviewed and are negative.    Allergies  Sulfonamide derivatives  Home Medications   Current Outpatient Rx  Name Route Sig Dispense Refill  . ALBUTEROL SULFATE HFA 108 (90 BASE) MCG/ACT IN AERS Inhalation Inhale 2 puffs into the lungs every 6 (six) hours as needed for wheezing. 1 Inhaler 2  . AMBULATORY NON FORMULARY MEDICATION  Medication Name: Glucometer with strips and lancets to check daily.  Dx: Diabetes 250.00 1 Units 3  . FLUOXETINE HCL 10 MG PO CAPS  1 po QD for 1 wk, then inc to 2 tabs daily 60 capsule 0  . LOSARTAN POTASSIUM-HCTZ 100-25 MG PO TABS Oral Take 1 tablet by mouth daily. 30 tablet 3  . METOPROLOL SUCCINATE ER 25 MG PO TB24 Oral Take 1 tablet (25 mg total) by mouth daily. 90 tablet 1  . OMEPRAZOLE 20 MG PO CPDR Oral Take 1 capsule (20 mg total) by mouth daily. Take 30 minutes before food. 15 capsule 1  . SITAGLIPTIN-METFORMIN HCL ER [phone]  MG PO TB24 Oral Take 100 mg by mouth daily. 30 tablet 6    BP 138/88  Pulse 86  Temp(Src) 98.7 F (37.1 C) (Oral)  Resp 16  Ht 5\' 8"  (1.727 m)  Wt 230 lb (104.327 kg)  BMI 34.97 kg/m2  SpO2 97%  Physical Exam Nursing notes and Vital Signs reviewed. Appearance:  Patient appears healthy, stated age, and in no acute distress Eyes:  Pupils are equal, round, and reactive to light and accomodation.  Extraocular movement is intact.  Conjunctivae are not inflamed  Ears:  Canals normal.  Tympanic membranes normal.  Nose:  Mildly congested turbinates.  No sinus tenderness.   Pharynx:  Normal Neck:  Supple.   No adenopathy Lungs:  Clear to auscultation.   Breath sounds are equal.   Chest:  There is distinct tenderness left lateral/inferior ribs.  No crepitance. Heart:  Regular rate and rhythm without murmurs, rubs, or gallops.  Abdomen:  Nontender without masses or hepatosplenomegaly.  Bowel sounds are present.  No CVA or flank tenderness.  Extremities:  No edema.  No calf tenderness Skin:  No rash present.   ED Course  Procedures none   Dg Ribs Unilateral W/chest Left  07/12/2011  *RADIOLOGY REPORT*  Clinical Data: Persistent cough for 1 month, left-sided rib pain  LEFT RIBS AND CHEST - 3+ VIEW  Comparison: None.  Findings: The lungs are clear.  Mediastinal contours appear normal. The heart is mildly enlarged.  Left rib detail films show no acute left rib fracture.  IMPRESSION: No active lung disease.  Negative left rib detail.  Original Report Authenticated By: Juline Patch, M.D.     1. Chronic cough   2. Rib pain on left side   3. GERD (gastroesophageal reflux disease)   Reviewed patient's chart:  Noticed previous visits to PCP for cough.     Suspect cough a result of GERD.  Night sweats are worrisome; will place a PPD.    MDM   Patient had to leave prior to final interview.  Will begin trial of omeprazole.  Reflux precautions.  Trial of rib belt.  PPD placed. Followup with Family Doctor in about two weeks.  On patient's return to clinic at time 1440, discussed his condition and suggestions for controlling acid reflux.  Rib belt applied.  Rx for Tylenol #3 for pain at bedtime.  Return in 48 hours to read PPD.       Lattie Haw, MD 07/12/11 [phone]

## 2012-01-19 ENCOUNTER — Emergency Department (INDEPENDENT_AMBULATORY_CARE_PROVIDER_SITE_OTHER)
Admission: EM | Admit: 2012-01-19 | Discharge: 2012-01-19 | Disposition: A | Discharge status: Home / Self Care | Payer: BC Managed Care – PPO | Source: Home / Self Care

## 2012-01-19 ENCOUNTER — Encounter: Payer: Self-pay | Admitting: Emergency Medicine

## 2012-01-19 DIAGNOSIS — H6090 Unspecified otitis externa, unspecified ear: Secondary | ICD-10-CM

## 2012-01-19 DIAGNOSIS — H6690 Otitis media, unspecified, unspecified ear: Secondary | ICD-10-CM

## 2012-01-19 DIAGNOSIS — I10 Essential (primary) hypertension: Secondary | ICD-10-CM

## 2012-01-19 DIAGNOSIS — Z716 Tobacco abuse counseling: Secondary | ICD-10-CM

## 2012-01-19 MED ORDER — AMOXICILLIN 875 MG PO TABS: 875.0000 mg | ORAL_TABLET | Freq: Three times a day (TID) | ORAL | Status: DC

## 2012-01-19 MED ORDER — NEOMYCIN-POLYMYXIN-HC 3.5-10000-1 OT SOLN: 3.0000 [drp] | Freq: Four times a day (QID) | OTIC | Status: AC

## 2012-01-19 NOTE — ED Provider Notes (Signed)
History     CSN: [id number]  Arrival date & time 01/19/12  1816   First MD Initiated Contact with Patient 01/19/12 1823      Chief Complaint  Patient presents with  . Otalgia    HPI EAR PAIN Location:  R ear  Description: R ear pain and discomfort Onset:  4 hours  Modifying factors: uncontrolled diabetic, uncontrolled HTN, 1 PPD smoker   Symptoms  Sensation of fullness: mild Ear discharge: no URI symptoms: yes  Fever: no Tinnitus:no   Dizziness:no   Hearing loss:no   Toothache: no Rashes or lesions: no Facial muscle weakness: no  Red Flags Recent trauma: no PMH prior ear surgery:  no Diabetes or Immunosuppresion: yes     Past Medical History  Diagnosis Date  . Heart murmur   . Heart murmur   . Hypertension   . Diabetes mellitus     History reviewed. No pertinent past surgical history.  Family History  Problem Relation Age of Onset  . Cancer      grandmother  . Diabetes Brother   . Hyperlipidemia Mother   . Hypertension Father   . Prostate cancer Father   . Heart attack      grandfather    History  Substance Use Topics  . Smoking status: Current Every Day Smoker -- 0.5 packs/day  . Smokeless tobacco: Current User  . Alcohol Use: Yes      Review of Systems  All other systems reviewed and are negative.    Allergies  Sulfonamide derivatives  Home Medications   Current Outpatient Rx  Name  Route  Sig  Dispense  Refill  . ACETAMINOPHEN-CODEINE #3 300-30 MG PO TABS      Take one or two tabs by mouth at bedtime as needed for pain   15 tablet   0   . ALBUTEROL SULFATE HFA 108 (90 BASE) MCG/ACT IN AERS   Inhalation   Inhale 2 puffs into the lungs every 6 (six) hours as needed for wheezing.   1 Inhaler   2   . AMBULATORY NON FORMULARY MEDICATION      Medication Name: Glucometer with strips and lancets to check daily.  Dx: Diabetes 250.00   1 Units   3   . FLUOXETINE HCL 10 MG PO CAPS      1 po QD for 1 wk, then inc to 2 tabs  daily   60 capsule   0   . LOSARTAN POTASSIUM-HCTZ 100-25 MG PO TABS   Oral   Take 1 tablet by mouth daily.   30 tablet   3   . METOPROLOL SUCCINATE ER 25 MG PO TB24   Oral   Take 1 tablet (25 mg total) by mouth daily.   90 tablet   1   . OMEPRAZOLE 20 MG PO CPDR   Oral   Take 1 capsule (20 mg total) by mouth daily. Take 30 minutes before food.   15 capsule   1   . SITAGLIPTIN-METFORMIN HCL ER [phone] MG PO TB24   Oral   Take 100 mg by mouth daily.   30 tablet   6     BP 154/104  Pulse 101  Temp 98.6 F (37 C) (Oral)  Resp 18  Ht 5\' 11"  (1.803 m)  Wt 224 lb (101.606 kg)  BMI 31.24 kg/m2  SpO2 96%  Physical Exam  Constitutional: He appears well-developed and well-nourished.  HENT:  Head: Normocephalic and atraumatic.  R ear canal erythema and tenderness to otoscopic evaluation.  Mild R TM bulging.   Eyes: Conjunctivae normal are normal. Pupils are equal, round, and reactive to light.  Neck: Normal range of motion. Neck supple.  Cardiovascular: Normal rate and regular rhythm.   Pulmonary/Chest: Effort normal and breath sounds normal.  Abdominal: Soft.  Musculoskeletal: Normal range of motion.  Neurological: He is alert.  Skin: Skin is warm.    ED Course  Procedures (including critical care time)  Labs Reviewed - No data to display No results found.   1. Otitis media   2. Otitis externa   3. Tobacco abuse counseling   4. HTN (hypertension)       MDM  Will treat with amox and cortisporin otic.  Discussed general care and infectious red flags.  Discussed smoking cessation.  Also discussed BP. Currently asymptomatic. Discussed compliance with BP regimen and PCP follow up.  Pt expressed understanding.      The patient and/or caregiver has been counseled thoroughly with regard to treatment plan and/or medications prescribed including dosage, schedule, interactions, rationale for use, and possible side effects and they verbalize  understanding. Diagnoses and expected course of recovery discussed and will return if not improved as expected or if the condition worsens. Patient and/or caregiver verbalized understanding.             Doree Albee, MD 01/19/12 1910

## 2012-01-19 NOTE — ED Notes (Signed)
Rt ear pain x 3 hours

## 2012-04-04 ENCOUNTER — Encounter: Payer: Self-pay | Admitting: Family Medicine

## 2012-04-04 ENCOUNTER — Ambulatory Visit (INDEPENDENT_AMBULATORY_CARE_PROVIDER_SITE_OTHER): Payer: BC Managed Care – PPO | Admitting: Family Medicine

## 2012-04-04 VITALS — BP 140/90 | HR 95 | Ht 68.0 in | Wt 207.0 lb

## 2012-04-04 DIAGNOSIS — Z720 Tobacco use: Secondary | ICD-10-CM

## 2012-04-04 DIAGNOSIS — I10 Essential (primary) hypertension: Secondary | ICD-10-CM

## 2012-04-04 DIAGNOSIS — F17200 Nicotine dependence, unspecified, uncomplicated: Secondary | ICD-10-CM

## 2012-04-04 DIAGNOSIS — Z0000 Encounter for general adult medical examination without abnormal findings: Secondary | ICD-10-CM

## 2012-04-04 DIAGNOSIS — IMO0001 Reserved for inherently not codable concepts without codable children: Secondary | ICD-10-CM

## 2012-04-04 LAB — POCT UA - MICROALBUMIN
Creatinine, POC: 50 mg/dL
Microalbumin Ur, POC: 10 mg/dL

## 2012-04-04 MED ORDER — LOSARTAN POTASSIUM-HCTZ 100-25 MG PO TABS: 1.0000 | ORAL_TABLET | Freq: Every day | ORAL | Status: DC

## 2012-04-04 MED ORDER — SITAGLIP PHOS-METFORMIN HCL ER 100-1000 MG PO TB24: 100.0000 mg | ORAL_TABLET | Freq: Every day | ORAL | Status: DC

## 2012-04-04 NOTE — Progress Notes (Signed)
  Subjective:    Patient ID: Larry Lawson, male    DOB: Sep 05, 1970, 42 y.o.   MRN: [id number]  HPI DM- not checking sugars.  No hypoglycemic events.  No cuts or sores that arent' healing well. Says tolerating his Janumet well but out of it for a month.  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 He does need a new prescription as he has now changed insurance companies.  HTN-  Pt denies chest pain, SOB, dizziness, or heart palpitations.  Taking meds as directed w/o problems.  Denies medication side effects.  Some exercise about 90 min per weeks.     Review of Systems     Objective:   Physical Exam  Constitutional: He is oriented to person, place, and time. He appears well-developed and well-nourished.  HENT:  Head: Normocephalic and atraumatic.  Eyes: Conjunctivae normal are normal. Pupils are equal, round, and reactive to light.  Neck: Neck supple. No thyromegaly present.  Cardiovascular: Normal rate, regular rhythm and normal heart sounds.   Pulmonary/Chest: Effort normal and breath sounds normal.  Musculoskeletal: He exhibits no edema.  Lymphadenopathy:    He has no cervical adenopathy.  Neurological: He is alert and oriented to person, place, and time.  Skin: Skin is warm and dry.  Psychiatric: He has a normal mood and affect. His behavior is normal.          Assessment & Plan:   DM-Uncontrolled.  Work on diet changes and exercise. Had foot exam and urine micro today. Will restart the Janumet.  Due for eye exam. Will place referral.  Dsicusssed tx options.  F/u in 3 months.  I would like to get up-to-date fasting blood work including cholesterol when I see him back in 3 months. Right now his A1c is so high that his cholesterol will definitely be elevated. That is on new guidelines he is a candidate for starting a statin we can discuss this further at his followup. Lab Results  Component Value Date   HGBA1C 13.3  04/04/2012    Tob abuse-  Still smoking occ. Encouraged cessation.    HTN -  Uncontrolled.  He has been out of his medications. Restart losartan HCTZ. He was confused and thought he wasis a the lisinopril. He is also not actively taking his metoprolol and doesn't even remember having a prescription for her. We'll see what his blood pressures doing when I see him back on just the losartan HCTZ and I will go ahead and remove her Toprol from the med list.

## 2012-04-04 NOTE — Patient Instructions (Signed)
Try to get your eye exam.    

## 2012-07-03 ENCOUNTER — Ambulatory Visit: Payer: BC Managed Care – PPO | Admitting: Family Medicine

## 2012-08-04 ENCOUNTER — Ambulatory Visit (INDEPENDENT_AMBULATORY_CARE_PROVIDER_SITE_OTHER): Payer: BC Managed Care – PPO | Admitting: Family Medicine

## 2012-08-04 ENCOUNTER — Encounter: Payer: Self-pay | Admitting: Family Medicine

## 2012-08-04 VITALS — BP 136/86 | HR 100 | Ht 68.0 in | Wt 206.0 lb

## 2012-08-04 DIAGNOSIS — I10 Essential (primary) hypertension: Secondary | ICD-10-CM

## 2012-08-04 DIAGNOSIS — F32A Depression, unspecified: Secondary | ICD-10-CM

## 2012-08-04 DIAGNOSIS — IMO0001 Reserved for inherently not codable concepts without codable children: Secondary | ICD-10-CM

## 2012-08-04 DIAGNOSIS — F329 Major depressive disorder, single episode, unspecified: Secondary | ICD-10-CM

## 2012-08-04 MED ORDER — DAPAGLIFLOZIN PROPANEDIOL 5 MG PO TABS: 1.0000 | ORAL_TABLET | Freq: Every day | ORAL | Status: DC

## 2012-08-04 MED ORDER — FLUOXETINE HCL 10 MG PO CAPS: ORAL_CAPSULE | ORAL | Status: DC

## 2012-08-04 NOTE — Progress Notes (Signed)
Subjective:    Patient ID: Larry Lawson, male    DOB: Aug 19, 1970, 42 y.o.   MRN: [id number]  HPI DM- no hypoglycemic events.  No wounds that aren't healing well. Still trying to work on losing weight.  He says he is taking his medications regularly without any side effects or problems. He has not had any GI upset. He still continues to work on diet. He is undergoing a lot of stress right now.  HTN -  Pt denies chest pain, SOB, dizziness, or heart palpitations.  Taking meds as directed w/o problems.  Denies medication side effects.    Depression - Says has more violent mood swings that before. He complains of feeling down or depressed nearly every day. He also feels it's affecting his sleep. He also has been feeling bad about himself. Also complains of some mild fatigue and has had some thoughts of feeling better off dead but denies active thoughts of hurting himself. His wife has been going to a Veterinary surgeon. They're currently going through separation. He is not currently on medication worsening of therapist. He did take fluoxetine about a year ago when they initially started going through divorce process. It was helpful. He thinks he might need to restart it now. Says has been drinking more alcohol than usual.     Review of Systems  BP 136/86  Pulse 100  Ht 5\' 8"  (1.727 m)  Wt 206 lb (93.441 kg)  BMI 31.33 kg/m2    Allergies  Allergen Reactions  . Sulfonamide Derivatives     REACTION: pt. can't remember reaction    Past Medical History  Diagnosis Date  . Heart murmur   . Heart murmur   . Hypertension   . Diabetes mellitus     No past surgical history on file.  History   Social History  . Marital Status: Married    Spouse Name: N/A    Number of Children: N/A  . Years of Education: N/A   Occupational History  . Not on file.   Social History Main Topics  . Smoking status: Light Tobacco Smoker -- 0.50 packs/day  . Smokeless tobacco: Current User  . Alcohol Use: Yes  .  Drug Use: No  . Sexually Active: Not on file     Comment: [employer] @ [employer], Masters degree, married, no regular exercise.   Other Topics Concern  . Not on file   Social History Narrative  . No narrative on file    Family History  Problem Relation Age of Onset  . Cancer      grandmother  . Diabetes Brother   . Hyperlipidemia Mother   . Hypertension Father   . Prostate cancer Father   . Heart attack      grandfather    Outpatient Encounter Prescriptions as of 08/04/2012  Medication Sig Dispense Refill  . albuterol (PROVENTIL HFA;VENTOLIN HFA) 108 (90 BASE) MCG/ACT inhaler Inhale 2 puffs into the lungs every 6 (six) hours as needed for wheezing.  1 Inhaler  2  . AMBULATORY NON FORMULARY MEDICATION Medication Name: Glucometer with strips and lancets to check daily.  Dx: Diabetes 250.00  1 Units  3  . Dapagliflozin Propanediol (FARXIGA) 5 MG TABS Take 1 tablet by mouth daily.  28 tablet  0  . FLUoxetine (PROZAC) 10 MG capsule 1 po QD for 1 wk, then inc to 2 tabs daily  60 capsule  0  . losartan-hydrochlorothiazide (HYZAAR) 100-25 MG per tablet Take 1 tablet by  mouth daily.  30 tablet  2  . SitaGLIPtin-MetFORMIN HCl (JANUMET XR) [phone] MG TB24 Take 100 mg by mouth daily.  30 tablet  2  . [DISCONTINUED] omeprazole (PRILOSEC) 20 MG capsule Take 1 capsule (20 mg total) by mouth daily. Take 30 minutes before food.  15 capsule  1   No facility-administered encounter medications on file as of 08/04/2012.          Objective:   Physical Exam  Constitutional: He is oriented to person, place, and time. He appears well-developed and well-nourished.  HENT:  Head: Normocephalic and atraumatic.  Left Ear: External ear normal.  Left TM and canal are clear. He asked me to look in his ears to make sure that ok  Neck: Neck supple. No thyromegaly present.  Cardiovascular: Normal rate, regular rhythm and normal heart sounds.   Pulmonary/Chest: Effort normal and breath sounds  normal.  Lymphadenopathy:    He has no cervical adenopathy.  Neurological: He is alert and oriented to person, place, and time.  Skin: Skin is warm and dry.  Psychiatric: He has a normal mood and affect. His behavior is normal.          Assessment & Plan:  DM- Improved. Still uncontrolled but he has made great strides.. We discussed adding a second agent to improve his blood sugars. Given samples of Farxiga to try for at least 4 weeks and nausea back in one month. We did discuss potential risks and side effects of the medication. If He is tolerating it well when I see him back, consider for prescription. Continue to work on diet and regular exercise. We discussed the goal is to get his A1c under 7. He is doing well overall with his weight loss in the last year.  HTN - Well controlled. Continue current regimen. Call for any problems.  Depression/acute situational stress - will refer to [employer]. PHQ- 9 score of 14.  Will restart fluoextine.  Followup in one month. Discussed that if we do restart the medication that he will need to cut back on his alcohol consumption. He agrees.

## 2012-08-31 ENCOUNTER — Ambulatory Visit: Payer: BC Managed Care – PPO | Admitting: Family Medicine

## 2012-08-31 DIAGNOSIS — Z0289 Encounter for other administrative examinations: Secondary | ICD-10-CM

## 2012-09-05 ENCOUNTER — Other Ambulatory Visit: Payer: Self-pay | Admitting: Family Medicine

## 2012-09-12 ENCOUNTER — Encounter: Payer: Self-pay | Admitting: Family Medicine

## 2012-09-12 ENCOUNTER — Ambulatory Visit (INDEPENDENT_AMBULATORY_CARE_PROVIDER_SITE_OTHER): Payer: BC Managed Care – PPO | Admitting: Family Medicine

## 2012-09-12 ENCOUNTER — Ambulatory Visit: Payer: BC Managed Care – PPO | Admitting: Family Medicine

## 2012-09-12 ENCOUNTER — Ambulatory Visit (INDEPENDENT_AMBULATORY_CARE_PROVIDER_SITE_OTHER): Payer: BC Managed Care – PPO | Admitting: Licensed Clinical Social Worker

## 2012-09-12 ENCOUNTER — Ambulatory Visit: Payer: BC Managed Care – PPO | Admitting: Licensed Clinical Social Worker

## 2012-09-12 VITALS — BP 163/100 | HR 94 | Wt 208.0 lb

## 2012-09-12 DIAGNOSIS — F331 Major depressive disorder, recurrent, moderate: Secondary | ICD-10-CM

## 2012-09-12 DIAGNOSIS — F329 Major depressive disorder, single episode, unspecified: Secondary | ICD-10-CM

## 2012-09-12 DIAGNOSIS — IMO0001 Reserved for inherently not codable concepts without codable children: Secondary | ICD-10-CM

## 2012-09-12 DIAGNOSIS — F32A Depression, unspecified: Secondary | ICD-10-CM

## 2012-09-12 DIAGNOSIS — F17200 Nicotine dependence, unspecified, uncomplicated: Secondary | ICD-10-CM

## 2012-09-12 DIAGNOSIS — I10 Essential (primary) hypertension: Secondary | ICD-10-CM

## 2012-09-12 DIAGNOSIS — Z720 Tobacco use: Secondary | ICD-10-CM | POA: Insufficient documentation

## 2012-09-12 MED ORDER — NICOTINE 14 MG/24HR TD PT24: 1.0000 | MEDICATED_PATCH | TRANSDERMAL | Status: DC

## 2012-09-12 MED ORDER — DAPAGLIFLOZIN PROPANEDIOL 10 MG PO TABS: 10.0000 mg | ORAL_TABLET | Freq: Every day | ORAL | Status: DC

## 2012-09-12 MED ORDER — NICOTINE 21 MG/24HR TD PT24: 1.0000 | MEDICATED_PATCH | TRANSDERMAL | Status: DC

## 2012-09-12 MED ORDER — BUPROPION HCL ER (XL) 150 MG PO TB24: 150.0000 mg | ORAL_TABLET | Freq: Every day | ORAL | Status: DC

## 2012-09-12 NOTE — Progress Notes (Signed)
  Subjective:    Patient ID: Larry Lawson, male    DOB: Jul 17, 1970, 42 y.o.   MRN: [id number]  HPI Hypertension-out of his meds for couple weeks. Plans to pick up rx today. No CP or SOB.  No swelling.   Depression-he stopped his Prozac about a week ago because of the side effects of sexual dysfunction.  Still feeling really down. Says didn't really click with counselor downstairs so has appt with [name] later today.  He is not sure if wants to take meds or not.   Tob abuse - Smokes 1ppd. Says occ wakes up at night to smoke.  He is interested in quitting.  Has tried before. Not sure if should use something to help him or quit cold [place].   Diabetes-he tolerated the Farxiga samples well. Has not noticed any problems or side effects. No burning with urination or rashes. Review of Systems     Objective:   Physical Exam  Constitutional: He is oriented to person, place, and time. He appears well-developed and well-nourished.  HENT:  Head: Normocephalic and atraumatic.  Cardiovascular: Normal rate, regular rhythm and normal heart sounds.   Pulmonary/Chest: Effort normal and breath sounds normal.  Neurological: He is alert and oriented to person, place, and time.  Skin: Skin is warm and dry.  Psychiatric: He has a normal mood and affect. His behavior is normal.          Assessment & Plan:  Hypertension-uncontrolled. Restart medications. Stressed the importance of staying on his blood pressure medications. Reaction: His refill a week ago and he has yet to pick it up. He is at least asymptomatic today which is reassuring. I would like to see him back at his followup in August for his diabetes and will check blood pressure again at that time.  Diabetes-he tolerated [place] well. Will increase to 10 mg dose. Coupon card and samples given today. Followup at the end of August/early September for diabetic followup. If his A1c will look much better. Reminded him again of increase risk of  urinary tract infections and yeast infections.  Depression-still uncontrolled. I'm very excited that he has made an appointment with a new counselor. I think this would be very helpful for him. In the meantime we discussed options of starting another medication or not. He has never tried Wellbutrin and I think is concerned about sexual side effects this may be a good choice. In addition it may help him with his smoking cessation. He says he would like to try it. The prescription sent to pharmacy.  tob abuse - discussed different options including quitting cold [place] and also using nicotine replacement such as the gum, lozenges and patches. He says he is most interested in the patches. He smokes about a pack a day and sometimes a little more. He also wakes up to smoke which is typically a sign of a strong addiction. I sent a prescription over through the pharmacy to see if his insurance will cover this. This can be used safely in conjunction with Wellbutrin. We'll followup his appointment in August to see how well he is doing. If he is doing well in one month and he can decrease his dose to the 14 mg patch. Both prescriptions were sent to pharmacy.

## 2012-09-14 ENCOUNTER — Ambulatory Visit (INDEPENDENT_AMBULATORY_CARE_PROVIDER_SITE_OTHER): Payer: BC Managed Care – PPO | Admitting: Licensed Clinical Social Worker

## 2012-09-14 ENCOUNTER — Ambulatory Visit: Payer: BC Managed Care – PPO | Admitting: Licensed Clinical Social Worker

## 2012-09-14 DIAGNOSIS — F321 Major depressive disorder, single episode, moderate: Secondary | ICD-10-CM

## 2012-09-26 ENCOUNTER — Ambulatory Visit (INDEPENDENT_AMBULATORY_CARE_PROVIDER_SITE_OTHER): Payer: BC Managed Care – PPO | Admitting: Licensed Clinical Social Worker

## 2012-09-26 DIAGNOSIS — F331 Major depressive disorder, recurrent, moderate: Secondary | ICD-10-CM

## 2012-09-30 ENCOUNTER — Other Ambulatory Visit: Payer: Self-pay | Admitting: Family Medicine

## 2012-10-03 ENCOUNTER — Ambulatory Visit (INDEPENDENT_AMBULATORY_CARE_PROVIDER_SITE_OTHER): Payer: BC Managed Care – PPO | Admitting: Licensed Clinical Social Worker

## 2012-10-03 DIAGNOSIS — F331 Major depressive disorder, recurrent, moderate: Secondary | ICD-10-CM

## 2012-10-19 ENCOUNTER — Ambulatory Visit: Payer: BC Managed Care – PPO | Admitting: Licensed Clinical Social Worker

## 2012-10-20 ENCOUNTER — Other Ambulatory Visit: Payer: Self-pay | Admitting: Family Medicine

## 2012-11-07 ENCOUNTER — Ambulatory Visit: Payer: BC Managed Care – PPO | Admitting: Family Medicine

## 2012-11-10 ENCOUNTER — Ambulatory Visit: Payer: BC Managed Care – PPO | Admitting: Family Medicine

## 2012-11-13 ENCOUNTER — Ambulatory Visit (INDEPENDENT_AMBULATORY_CARE_PROVIDER_SITE_OTHER): Payer: BC Managed Care – PPO | Admitting: Family Medicine

## 2012-11-13 ENCOUNTER — Encounter: Payer: Self-pay | Admitting: Family Medicine

## 2012-11-13 VITALS — BP 147/88 | HR 108 | Wt 205.0 lb

## 2012-11-13 DIAGNOSIS — I10 Essential (primary) hypertension: Secondary | ICD-10-CM

## 2012-11-13 DIAGNOSIS — F17200 Nicotine dependence, unspecified, uncomplicated: Secondary | ICD-10-CM

## 2012-11-13 DIAGNOSIS — Z720 Tobacco use: Secondary | ICD-10-CM

## 2012-11-13 DIAGNOSIS — E119 Type 2 diabetes mellitus without complications: Secondary | ICD-10-CM

## 2012-11-13 DIAGNOSIS — Z8249 Family history of ischemic heart disease and other diseases of the circulatory system: Secondary | ICD-10-CM

## 2012-11-13 LAB — POCT GLYCOSYLATED HEMOGLOBIN (HGB A1C): Hemoglobin A1C: 8.6

## 2012-11-13 NOTE — Progress Notes (Addendum)
  Subjective:    Patient ID: Larry Lawson, male    DOB: Aug 14, 1970, 42 y.o.   MRN: [id number]  HPI DM- no hypoglycemic events. No wounds or cuts or sores that are not healing well.  Doing well on farxiga. No SE.  Has cut back on his drinkin (alchohol) and his carbs . Down 3 lbs from last OV.   HTN -  Pt denies chest pain, SOB, dizziness, or heart palpitations.  Taking meds as directed w/o problems.  Denies medication side effects.  Missed dose today as was running late.   Tob abuse - using nicotine patch   He also wanted to mention to me that his brother who is 3 years older than him, age 26 just had quadruple bypass. He also has several other family members with premature heart disease. He would like to have a stress test performed.  Review of Systems     Objective:   Physical Exam  Constitutional: He is oriented to person, place, and time. He appears well-developed and well-nourished.  HENT:  Head: Normocephalic and atraumatic.  Cardiovascular: Normal rate, regular rhythm and normal heart sounds.   Pulmonary/Chest: Effort normal and breath sounds normal.  Neurological: He is alert and oriented to person, place, and time.  Skin: Skin is warm and dry.  Psychiatric: He has a normal mood and affect. His behavior is normal.          Assessment & Plan:  DM-   Uncontrolled but much improved.  Almost at goal.  ON ARB. Not on a statin. Work on diet and exercise and recheck in 3 months. He was not wanting to start insulin at this point.  F/u in 3 months.   HTN - Uncontrolled.  Restart meds.    Tobacco abuse-Encouraged cessation.   I definitely think it would be reasonable to get a stress test with his family history of premature heart disease. He also has additional risk factors including hypertension, diabetes and tobacco abuse.

## 2012-11-15 NOTE — Addendum Note (Signed)
Addended by: Nani Gasser D on: 11/15/2012 08:46 AM   Modules accepted: Orders

## 2012-12-19 ENCOUNTER — Encounter: Payer: BC Managed Care – PPO | Admitting: Physician Assistant

## 2012-12-20 ENCOUNTER — Encounter: Payer: Self-pay | Admitting: Physician Assistant

## 2012-12-28 ENCOUNTER — Other Ambulatory Visit: Payer: Self-pay | Admitting: Family Medicine

## 2012-12-31 ENCOUNTER — Other Ambulatory Visit: Payer: Self-pay | Admitting: Family Medicine

## 2013-02-12 ENCOUNTER — Ambulatory Visit: Payer: BC Managed Care – PPO | Admitting: Family Medicine

## 2013-02-18 ENCOUNTER — Other Ambulatory Visit: Payer: Self-pay | Admitting: Family Medicine

## 2013-02-25 ENCOUNTER — Emergency Department
Admission: EM | Admit: 2013-02-25 | Discharge: 2013-02-25 | Disposition: A | Discharge status: Still In Hospital | Payer: BC Managed Care – PPO | Source: Home / Self Care

## 2013-02-26 ENCOUNTER — Encounter: Payer: Self-pay | Admitting: Family Medicine

## 2013-02-26 ENCOUNTER — Other Ambulatory Visit: Payer: Self-pay | Admitting: Family Medicine

## 2013-02-26 ENCOUNTER — Other Ambulatory Visit: Payer: Self-pay | Admitting: *Deleted

## 2013-02-26 ENCOUNTER — Ambulatory Visit (INDEPENDENT_AMBULATORY_CARE_PROVIDER_SITE_OTHER): Payer: BC Managed Care – PPO | Admitting: Family Medicine

## 2013-02-26 VITALS — BP 146/96 | HR 87 | Temp 98.3°F | Wt 203.0 lb

## 2013-02-26 DIAGNOSIS — B9689 Other specified bacterial agents as the cause of diseases classified elsewhere: Secondary | ICD-10-CM

## 2013-02-26 DIAGNOSIS — A499 Bacterial infection, unspecified: Secondary | ICD-10-CM

## 2013-02-26 DIAGNOSIS — R5381 Other malaise: Secondary | ICD-10-CM

## 2013-02-26 DIAGNOSIS — J329 Chronic sinusitis, unspecified: Secondary | ICD-10-CM

## 2013-02-26 LAB — POCT INFLUENZA A/B: Influenza A, POC: NEGATIVE

## 2013-02-26 MED ORDER — DOXYCYCLINE HYCLATE 100 MG PO TABS
ORAL_TABLET | ORAL | Status: AC
Start: 2013-02-26 — End: 2013-03-08

## 2013-02-26 NOTE — Progress Notes (Signed)
CC: Larry Lawson is a 42 y.o. male is here for URI   Subjective: HPI:  Complains of facial pressure localized above both eyes with thick nasal discharge along with fatigue and a productive cough that has been present ever since Friday. Symptoms came on acutely have not gotten better or worse since onset slightly improved with NyQuil nothing else makes better or worse present all hours of the day overall moderate in severity reports subjective fevers and chills. Denies confusion, shortness of breath, chest pain, abdominal pain, diarrhea, nor nausea   Review Of Systems Outlined In HPI  Past Medical History  Diagnosis Date  . Heart murmur   . Heart murmur   . Hypertension   . Diabetes mellitus      Family History  Problem Relation Age of Onset  . Cancer      grandmother  . Diabetes Brother   . Hyperlipidemia Mother   . Hypertension Father   . Prostate cancer Father   . Heart attack      grandfather  . Heart attack Brother 46  . Heart failure Brother   . Heart failure Mother   . Heart disease Paternal Uncle      History  Substance Use Topics  . Smoking status: Light Tobacco Smoker -- 0.50 packs/day  . Smokeless tobacco: Current User  . Alcohol Use: Yes     Objective: Filed Vitals:   02/26/13 1113  BP: 146/96  Pulse: 87  Temp: 98.3 F (36.8 C)    General: Alert and Oriented, No Acute Distress appears somewhat fatigued HEENT: Pupils equal, round, reactive to light. Conjunctivae clear.  External ears unremarkable, canals clear with intact TMs with appropriate landmarks.  Middle ear appears open without effusion. Boggy erythematous inferior turbinates with moderate mucoid discharge.  Moist mucous membranes, pharynx without inflammation nor lesions.  Neck supple without palpable lymphadenopathy nor abnormal masses. Lungs: Comfortable work of breathing with trace end expiratory wheezing heard mostly in the right lung fields no rhonchi or rales  Cardiac: Regular rate and  rhythm. Normal S1/S2.  No murmurs, rubs, nor gallops.   Mental Status: No depression, anxiety, nor agitation. Skin: Warm and dry.  Assessment & Plan: Johnnie was seen today for uri.  Diagnoses and associated orders for this visit:  Other malaise and fatigue - POCT Influenza A/B  Bacterial sinusitis - doxycycline (VIBRA-TABS) 100 MG tablet; One by mouth twice a day for ten days.    Rapid flu is negative, start doxycycline for treatment of bacterial sinusitis consider Alka-Seltzer cold and sinus to help with symptom control  Return if symptoms worsen or fail to improve.

## 2013-02-27 ENCOUNTER — Ambulatory Visit: Payer: BC Managed Care – PPO | Admitting: Family Medicine

## 2013-02-27 DIAGNOSIS — Z0289 Encounter for other administrative examinations: Secondary | ICD-10-CM

## 2013-04-07 ENCOUNTER — Other Ambulatory Visit: Payer: Self-pay | Admitting: Family Medicine

## 2013-05-03 ENCOUNTER — Other Ambulatory Visit: Payer: Self-pay | Admitting: Family Medicine

## 2013-05-20 ENCOUNTER — Other Ambulatory Visit: Payer: Self-pay | Admitting: Family Medicine

## 2013-06-20 ENCOUNTER — Other Ambulatory Visit: Payer: Self-pay | Admitting: Family Medicine

## 2013-08-27 ENCOUNTER — Other Ambulatory Visit: Payer: Self-pay | Admitting: Family Medicine

## 2013-08-27 ENCOUNTER — Telehealth: Payer: Self-pay

## 2013-08-27 NOTE — Telephone Encounter (Signed)
Left detailed message for patient to call back and schedule an appointment. He will need to at least schedule an office visit before more refills.

## 2013-11-23 ENCOUNTER — Encounter: Payer: Self-pay | Admitting: Family Medicine

## 2013-11-23 ENCOUNTER — Ambulatory Visit (INDEPENDENT_AMBULATORY_CARE_PROVIDER_SITE_OTHER): Payer: BC Managed Care – PPO | Admitting: Family Medicine

## 2013-11-23 VITALS — BP 152/97 | HR 100 | Temp 99.0°F | Wt 186.0 lb

## 2013-11-23 DIAGNOSIS — I10 Essential (primary) hypertension: Secondary | ICD-10-CM

## 2013-11-23 DIAGNOSIS — J45901 Unspecified asthma with (acute) exacerbation: Secondary | ICD-10-CM

## 2013-11-23 DIAGNOSIS — J4521 Mild intermittent asthma with (acute) exacerbation: Secondary | ICD-10-CM

## 2013-11-23 MED ORDER — DOXYCYCLINE HYCLATE 100 MG PO TABS: ORAL_TABLET | ORAL | Status: AC

## 2013-11-23 MED ORDER — PREDNISONE 20 MG PO TABS: ORAL_TABLET | ORAL | Status: AC

## 2013-11-23 NOTE — Progress Notes (Signed)
CC: Larry Lawson is a 43 y.o. male is here for Cough and Sinusitis   Subjective: HPI:  Complains of productive cough with wheezing for the past week and a half on a daily basis worsening on every 2 daily basis. Present all hours of the day but worse in the evenings. No interventions as of yet other than taking an unknown over-the-counter cough medication. Which has not been helping although. Accompanied by fevers and chills yesterday with mild shortness of breath. Originally accompanied by facial pressure nasal congestion however this resolved.  Symptoms overall are moderate in severity.  Denies confusion, blood and sputum, chest pain, back pain, abdominal pain, nausea.  His many questions regarding blood pressure and diabetic medication in which over-the-counter cough medications might interfere with the effectiveness of these medications.   Review Of Systems Outlined In HPI  Past Medical History  Diagnosis Date  . Heart murmur   . Heart murmur   . Hypertension   . Diabetes mellitus     No past surgical history on file. Family History  Problem Relation Age of Onset  . Cancer      grandmother  . Diabetes Brother   . Hyperlipidemia Mother   . Hypertension Father   . Prostate cancer Father   . Heart attack      grandfather  . Heart attack Brother 46  . Heart failure Brother   . Heart failure Mother   . Heart disease Paternal Uncle     History   Social History  . Marital Status: Married    Spouse Name: N/A    Number of Children: N/A  . Years of Education: N/A   Occupational History  . Not on file.   Social History Main Topics  . Smoking status: Light Tobacco Smoker -- 0.50 packs/day  . Smokeless tobacco: Current User  . Alcohol Use: Yes  . Drug Use: No  . 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   Other Topics Concern  . Not on file   Social History Narrative  . No narrative on  file     Objective: BP 152/97  Pulse 100  Temp(Src) 99 F (37.2 C) (Oral)  Wt 186 lb (84.369 kg)  General: Alert and Oriented, No Acute Distress HEENT: Pupils equal, round, reactive to light. Conjunctivae clear.  External ears unremarkable, canals clear with intact TMs with appropriate landmarks.  Middle ear appears open without effusion. Pink inferior turbinates.  Moist mucous membranes, pharynx without inflammation nor lesions.  Neck supple without palpable lymphadenopathy nor abnormal masses. Lungs: Mild to moderate wheezing in all lung fields without rales or rhonchi nor signs of consolidation. frequent coughing Cardiac: Regular rate and rhythm. Normal S1/S2.  No murmurs, rubs, nor gallops.   Mental Status: No depression, anxiety, nor agitation. Skin: Warm and dry.  Assessment & Plan: Larry Lawson was seen today for cough and sinusitis.  Diagnoses and associated orders for this visit:  Reactive airway disease with wheezing, mild intermittent, with acute exacerbation - doxycycline (VIBRA-TABS) 100 MG tablet; One by mouth twice a day for ten days. - predniSONE (DELTASONE) 20 MG tablet; Three tabs at once daily for five days.  HYPERTENSION, BENIGN ESSENTIAL    Reactive airway exacerbation: Start prednisone and doxycycline. Time was taken to answer all questions regarding cough medication no interfere with blood pressure and diabetic medications. Overall encouraged him to stick with Coricidin products to afford interfering with  blood pressure. Prepped him that prednisone will likely cause transient rise in blood sugar.  25 minutes spent face-to-face during visit today of which at least 50% was counseling or coordinating care regarding: 1. Reactive airway disease with wheezing, mild intermittent, with acute exacerbation   2. HYPERTENSION, BENIGN ESSENTIAL      Return if symptoms worsen or fail to improve.

## 2013-12-03 ENCOUNTER — Ambulatory Visit: Payer: BC Managed Care – PPO | Admitting: Family Medicine

## 2013-12-07 ENCOUNTER — Ambulatory Visit: Payer: BC Managed Care – PPO | Admitting: Family Medicine

## 2014-06-21 ENCOUNTER — Ambulatory Visit (INDEPENDENT_AMBULATORY_CARE_PROVIDER_SITE_OTHER): Payer: Self-pay | Admitting: Physician Assistant

## 2014-06-21 ENCOUNTER — Encounter: Payer: Self-pay | Admitting: Physician Assistant

## 2014-06-21 VITALS — BP 125/82 | HR 108 | Ht 68.0 in | Wt 188.0 lb

## 2014-06-21 DIAGNOSIS — E118 Type 2 diabetes mellitus with unspecified complications: Secondary | ICD-10-CM | POA: Insufficient documentation

## 2014-06-21 DIAGNOSIS — I10 Essential (primary) hypertension: Secondary | ICD-10-CM

## 2014-06-21 DIAGNOSIS — Z205 Contact with and (suspected) exposure to viral hepatitis: Secondary | ICD-10-CM

## 2014-06-21 LAB — POCT GLYCOSYLATED HEMOGLOBIN (HGB A1C): Hemoglobin A1C: 13.4

## 2014-06-21 MED ORDER — INSULIN GLARGINE 300 UNIT/ML ~~LOC~~ SOPN: 10.0000 [IU] | PEN_INJECTOR | Freq: Every day | SUBCUTANEOUS | Status: DC

## 2014-06-21 MED ORDER — DAPAGLIFLOZIN PROPANEDIOL 10 MG PO TABS: 10.0000 mg | ORAL_TABLET | Freq: Every day | ORAL | Status: DC

## 2014-06-21 MED ORDER — SITAGLIP PHOS-METFORMIN HCL ER 100-1000 MG PO TB24: 1.0000 | ORAL_TABLET | Freq: Every day | ORAL | Status: DC

## 2014-06-21 MED ORDER — LOSARTAN POTASSIUM-HCTZ 100-25 MG PO TABS: 1.0000 | ORAL_TABLET | Freq: Every day | ORAL | Status: DC

## 2014-06-21 MED ORDER — AMBULATORY NON FORMULARY MEDICATION: Status: DC

## 2014-06-21 NOTE — Progress Notes (Signed)
Provided Patient with diabetic education on using insulin pen at home.

## 2014-06-21 NOTE — Patient Instructions (Addendum)
Diabetes Mellitus and Food It is important for you to manage your blood sugar (glucose) level. Your blood glucose level can be greatly affected by what you eat. Eating healthier foods in the appropriate amounts throughout the day at about the same time each day will help you control your blood glucose level. It can also help slow or prevent worsening of your diabetes mellitus. Healthy eating may even help you improve the level of your blood pressure and reach or maintain a healthy weight.  HOW CAN FOOD AFFECT ME? Carbohydrates Carbohydrates affect your blood glucose level more than any other type of food. Your dietitian will help you determine how many carbohydrates to eat at each meal and teach you how to count carbohydrates. Counting carbohydrates is important to keep your blood glucose at a healthy level, especially if you are using insulin or taking certain medicines for diabetes mellitus. Alcohol Alcohol can cause sudden decreases in blood glucose (hypoglycemia), especially if you use insulin or take certain medicines for diabetes mellitus. Hypoglycemia can be a life-threatening condition. Symptoms of hypoglycemia (sleepiness, dizziness, and disorientation) are similar to symptoms of having too much alcohol.  If your health care provider has given you approval to drink alcohol, do so in moderation and use the following guidelines:  Women should not have more than one drink per day, and men should not have more than two drinks per day. One drink is equal to:  12 oz of beer.  5 oz of wine.  1 oz of hard liquor.  Do not drink on an empty stomach.  Keep yourself hydrated. Have water, diet soda, or unsweetened iced tea.  Regular soda, juice, and other mixers might contain a lot of carbohydrates and should be counted. WHAT FOODS ARE NOT RECOMMENDED? As you make food choices, it is important to remember that all foods are not the same. Some foods have fewer nutrients per serving than other  foods, even though they might have the same number of calories or carbohydrates. It is difficult to get your body what it needs when you eat foods with fewer nutrients. Examples of foods that you should avoid that are high in calories and carbohydrates but low in nutrients include:  Trans fats (most processed foods list trans fats on the Nutrition Facts label).  Regular soda.  Juice.  Candy.  Sweets, such as cake, pie, doughnuts, and cookies.  Fried foods. WHAT FOODS CAN I EAT? Have nutrient-rich foods, which will nourish your body and keep you healthy. The food you should eat also will depend on several factors, including:  The calories you need.  The medicines you take.  Your weight.  Your blood glucose level.  Your blood pressure level.  Your cholesterol level. You also should eat a variety of foods, including:  Protein, such as meat, poultry, fish, tofu, nuts, and seeds (lean animal proteins are best).  Fruits.  Vegetables.  Dairy products, such as milk, cheese, and yogurt (low fat is best).  Breads, grains, pasta, cereal, rice, and beans.  Fats such as olive oil, trans fat-free margarine, canola oil, avocado, and olives. DOES EVERYONE WITH DIABETES MELLITUS HAVE THE SAME MEAL PLAN? Because every person with diabetes mellitus is different, there is not one meal plan that works for everyone. It is very important that you meet with a dietitian who will help you create a meal plan that is just right for you. Document Released: 11/19/2004 Document Revised: 02/27/2013 Document Reviewed: 01/19/2013 Exit[employer] Patient Information 2015 [employer]. This   information is not intended to replace advice given to you by your health care provider. Make sure you discuss any questions you have with your health care provider.   toujeo 10 units at bedtime every 5 days increase by 3 units until fasting glucose is between 100-120 stop if gets 40 units.  Start Janumet daily.  faxiga  once daily.  200 grams of carbs a day. No more than.

## 2014-06-25 ENCOUNTER — Telehealth: Payer: Self-pay | Admitting: Physician Assistant

## 2014-06-25 DIAGNOSIS — Z205 Contact with and (suspected) exposure to viral hepatitis: Secondary | ICD-10-CM | POA: Insufficient documentation

## 2014-06-25 MED ORDER — FLUOXETINE HCL 20 MG PO CAPS
20.0000 mg | ORAL_CAPSULE | Freq: Every day | ORAL | Status: DC
Start: 2014-06-25 — End: 2015-04-09

## 2014-06-25 MED ORDER — BUPROPION HCL ER (XL) 150 MG PO TB24: 150.0000 mg | ORAL_TABLET | Freq: Every day | ORAL | Status: DC

## 2014-06-25 NOTE — Progress Notes (Signed)
   Subjective:    Patient ID: Larry Lawson, male    DOB: 11-16-1970, 44 y.o.   MRN: [id number]  HPI Pt presents to the clinic to get tested for hep C due to exposure from confirmed positive girlfriend. He has only had sexual contact with patient. He denies any needle use.   Pt also feels awful. He is tried, moody, crampy, run down, and depressed. He has diabetes and has done nothing to treat for last year. He has depression and has been taking medication fairly regularly but been out off and on. He has HTN and not taken medication regularly.   He comes in today to hopefully get medication and get back on track.    Review of Systems  All other systems reviewed and are negative.      Objective:   Physical Exam  Constitutional: He is oriented to person, place, and time. He appears well-developed and well-nourished.  HENT:  Head: Normocephalic and atraumatic.  Cardiovascular: Normal rate, regular rhythm and normal heart sounds.   Pulmonary/Chest: Effort normal and breath sounds normal.  Neurological: He is alert and oriented to person, place, and time.  Skin: Skin is dry.  Psychiatric: He has a normal mood and affect. His behavior is normal.          Assessment & Plan:  Exposure to hep C- labs slip given to have drawn labs.   DM type II, uncontrolled- .Marland Kitchen. Lab Results  Component Value Date   HGBA1C 13.4 06/21/2014   Discussed this is reason enough to feel really bad. Pt aware it is time for insulin.  Will will get back on medication today and follow up with PcP in 2-3 months.  If cannot afford medication or problems please let us know. Discussed diet and gave HO and discussed carbs/sugar limiting.  Will start toujeo 10 units at bedtime increasing by 3 units every 5 days until fasting sugars are 100-120 or until get to 45 units.  Glucometer written for patient.  Restarted Janumet discussed there might be price concern for this ok with just metformin but pt wants to go on what  he was on before.  farxiga also was given to take once daily. Discussed SE"s. Tolerated in the past.   HTN- BP looks good today. Pt reports otherwise when he checks at home. Will start back on hyzaar can also protect kidneys. Continue to monitor BP for lows.   Depression- right now impossible to see how depression medication really working because DM so out of control. Refilled wellbutrin and prozac and follow up in 2-3 months with PCP to see if improving as sugars improve.

## 2014-06-25 NOTE — Telephone Encounter (Signed)
Call pt: we have a free class for diabetes education that will be starting in the next month or so. Would pt be interested? We could even set up a one on one since pt is taking toujeo as well.

## 2014-07-09 NOTE — Telephone Encounter (Signed)
Pt would definitely be interested in the diabetes class.  Also, the janumet is $800 so he wanted to know if you could send metformin or something to [facility] (it's free there).

## 2014-07-10 NOTE — Telephone Encounter (Signed)
To clarify taking toujeo and faraxgia? And just cannot afford janumet?   Please sign him up for class jennifer has information.

## 2014-07-11 NOTE — Telephone Encounter (Signed)
Well toujeo is 15 dollars even for cash pay. So they are not running card right.  We can certainly get metformin and a glipizide for free at [facility].  Let's figure out what is going on.  Your sugars are very elevated.

## 2014-07-11 NOTE — Telephone Encounter (Signed)
Pt said he was unable to get any of the diabetic meds.

## 2014-07-12 MED ORDER — METFORMIN HCL 1000 MG PO TABS: 1000.0000 mg | ORAL_TABLET | Freq: Two times a day (BID) | ORAL | Status: DC

## 2014-07-12 MED ORDER — GLIPIZIDE 10 MG PO TABS: 10.0000 mg | ORAL_TABLET | Freq: Two times a day (BID) | ORAL | Status: DC

## 2014-07-12 NOTE — Telephone Encounter (Signed)
Pt informed and would like to get the oral meds sent to [facility]. [facility].Loralee PacasBarkley, Ramiel Forti [place]

## 2014-07-15 NOTE — Telephone Encounter (Signed)
[facility]

## 2014-09-20 ENCOUNTER — Ambulatory Visit: Payer: Self-pay | Admitting: Family Medicine

## 2015-04-09 ENCOUNTER — Ambulatory Visit (INDEPENDENT_AMBULATORY_CARE_PROVIDER_SITE_OTHER): Payer: BLUE CROSS/BLUE SHIELD

## 2015-04-09 ENCOUNTER — Ambulatory Visit (INDEPENDENT_AMBULATORY_CARE_PROVIDER_SITE_OTHER): Payer: BLUE CROSS/BLUE SHIELD | Admitting: Family Medicine

## 2015-04-09 ENCOUNTER — Encounter: Payer: Self-pay | Admitting: Family Medicine

## 2015-04-09 ENCOUNTER — Telehealth: Payer: Self-pay

## 2015-04-09 VITALS — BP 164/97 | HR 90 | Wt 166.0 lb

## 2015-04-09 DIAGNOSIS — M5430 Sciatica, unspecified side: Secondary | ICD-10-CM

## 2015-04-09 DIAGNOSIS — M79645 Pain in left finger(s): Secondary | ICD-10-CM

## 2015-04-09 DIAGNOSIS — M47896 Other spondylosis, lumbar region: Secondary | ICD-10-CM | POA: Diagnosis not present

## 2015-04-09 DIAGNOSIS — E119 Type 2 diabetes mellitus without complications: Secondary | ICD-10-CM | POA: Diagnosis not present

## 2015-04-09 DIAGNOSIS — H539 Unspecified visual disturbance: Secondary | ICD-10-CM

## 2015-04-09 DIAGNOSIS — N521 Erectile dysfunction due to diseases classified elsewhere: Secondary | ICD-10-CM

## 2015-04-09 DIAGNOSIS — I10 Essential (primary) hypertension: Secondary | ICD-10-CM | POA: Diagnosis not present

## 2015-04-09 LAB — BASIC METABOLIC PANEL
BUN: 13 mg/dL (ref 4–21)
CREATININE: 0.8 mg/dL (ref 0.6–1.3)
Glucose: 409 mg/dL
SODIUM: 132 mmol/L — AB (ref 137–147)

## 2015-04-09 LAB — LIPID PANEL
CHOLESTEROL: 194 mg/dL (ref 125–200)
HDL: 35 mg/dL — AB (ref >=40)
LDL Cholesterol: 134 mg/dL — ABNORMAL HIGH (ref <130)
TRIGLYCERIDES: 127 mg/dL (ref <150)
Total CHOL/HDL Ratio: 5.5 Ratio — ABNORMAL HIGH (ref <=5.0)
VLDL: 25 mg/dL (ref <30)

## 2015-04-09 LAB — CBC WITH DIFFERENTIAL/PLATELET
BASOS PCT: 0 % (ref 0–1)
Basophils Absolute: 0 10*3/uL (ref 0.0–0.1)
Eosinophils Absolute: 0.1 10*3/uL (ref 0.0–0.7)
Eosinophils Relative: 1 % (ref 0–5)
HCT: 50.9 % (ref 39.0–52.0)
HEMOGLOBIN: 17 g/dL (ref 13.0–17.0)
LYMPHS PCT: 24 % (ref 12–46)
Lymphs Abs: 2.4 10*3/uL (ref 0.7–4.0)
MCH: 29.2 pg (ref 26.0–34.0)
MCHC: 33.4 g/dL (ref 30.0–36.0)
MCV: 87.3 fL (ref 78.0–100.0)
MONOS PCT: 5 % (ref 3–12)
MPV: 10.4 fL (ref 8.6–12.4)
Monocytes Absolute: 0.5 10*3/uL (ref 0.1–1.0)
NEUTROS ABS: 6.9 10*3/uL (ref 1.7–7.7)
NEUTROS PCT: 70 % (ref 43–77)
Platelets: 283 10*3/uL (ref 150–400)
RBC: 5.83 MIL/uL — ABNORMAL HIGH (ref 4.22–5.81)
RDW: 13.1 % (ref 11.5–15.5)
WBC: 9.8 10*3/uL (ref 4.0–10.5)

## 2015-04-09 LAB — COMPLETE METABOLIC PANEL WITH GFR
ALBUMIN: 4.5 g/dL (ref 3.6–5.1)
ALK PHOS: 122 U/L — AB (ref 40–115)
ALT: 21 U/L (ref 9–46)
AST: 15 U/L (ref 10–40)
BILIRUBIN TOTAL: 0.5 mg/dL (ref 0.2–1.2)
BUN: 13 mg/dL (ref 7–25)
CALCIUM: 9.8 mg/dL (ref 8.6–10.3)
CO2: 28 mmol/L (ref 20–31)
Chloride: 96 mmol/L — ABNORMAL LOW (ref 98–110)
Creat: 0.8 mg/dL (ref 0.60–1.35)
GLUCOSE: 409 mg/dL — AB (ref 65–99)
Potassium: 4.6 mmol/L (ref 3.5–5.3)
Sodium: 132 mmol/L — ABNORMAL LOW (ref 135–146)
TOTAL PROTEIN: 6.8 g/dL (ref 6.1–8.1)

## 2015-04-09 LAB — POCT UA - MICROALBUMIN
Creatinine, POC: 50 mg/dL
MICROALBUMIN (UR) POC: 80 mg/L

## 2015-04-09 LAB — CBC AND DIFFERENTIAL: WBC: 9.8 10^3/mL

## 2015-04-09 LAB — POCT GLYCOSYLATED HEMOGLOBIN (HGB A1C): Hemoglobin A1C: 13.1

## 2015-04-09 LAB — TSH: TSH: 1.833 u[IU]/mL (ref 0.350–4.500)

## 2015-04-09 MED ORDER — LOSARTAN POTASSIUM 50 MG PO TABS: 50.0000 mg | ORAL_TABLET | Freq: Every day | ORAL | Status: DC

## 2015-04-09 MED ORDER — CANAGLIFLOZIN-METFORMIN HCL ER 150-1000 MG PO TB24: 1.0000 | ORAL_TABLET | Freq: Every day | ORAL | Status: DC

## 2015-04-09 MED ORDER — INSULIN PEN NEEDLE 31G X 8 MM MISC: Status: DC

## 2015-04-09 NOTE — Telephone Encounter (Signed)
Pt was started on insulin and invokamet today. Sugars should be coming down. Discuss with pt symptoms of DKA of polyuria, polydipsia, weakness, increased heart rate, n/v, abdominal pain.

## 2015-04-09 NOTE — Progress Notes (Signed)
Subjective:    Patient ID: [facility], male    DOB: 04/11/1970, 45 y.o.   MRN: [id number]  HPI Hypertension- Pt denies chest pain, SOB, dizziness, or heart palpitations.  He has not been on medications for almost a year. They says these can ask a tracking his blood pressures at home and they have been well controlled without medication. He says today this is the highest pressure he seen in a very long time. He now has new health insurance. Next  Diabetes-has been off her medications for well over a year. In fact the last time I saw him and prescribed insulin he never actually took it. He says he would also like to be referred back for nutrition counseling.  He also complains of erectile dysfunction that been going on for about 7 months.  He also complains about sciatica on the left side for the last couple of months. He denies any injury but he does do some heavy lifting at work. He says it's been radiating down the outer thigh and all the way down to the outside of his foot. It bothers him most days. No prior back injury. He's not really taking any medication for it currently. That he has tried some NSAIDs.  He is also having some pain in his left thumb-he says it locks at times. He says it feels similar to when he injured his anterior cruciate ligament in his knee.  Review of Systems     Objective:   Physical Exam  Constitutional: He is oriented to person, place, and time. He appears well-developed and well-nourished.  HENT:  Head: Normocephalic and atraumatic.  Cardiovascular: Normal rate, regular rhythm and normal heart sounds.   Pulmonary/Chest: Effort normal and breath sounds normal.  Musculoskeletal:  Normal lumbar flexion, extension, rotation right and left. Slightly tender over the left SI joint. Nontender over the right SI joint. Negative straight leg raise bilaterally. Hip, knee, ankle strength is 5 out of 5 bilaterally. Patellar reflexes 1+ bilaterally.  Neurological: He  is alert and oriented to person, place, and time.  Skin: Skin is warm and dry.  Psychiatric: He has a normal mood and affect. His behavior is normal.          Assessment & Plan:  HTN - uncontrolled that he says normally his home blood pressures look better. He definitely needs to restart an ACE inhibitor or an ARB. He is producing on losartan and did well with it sore going to restart that for kidney protection as well as blood pressure lowering. I will avoid using a diuretic. He was previously on HCTZ since he is going to be on a GLP 2 inhibitor medication.  DM- we'll start in Invokamet. Given coupon card. We'll also start to share. Demonstrated how to do his first injection. He injected 10 units himself here in the office. Coupon card provided as well. Also gave him a couple of needles. Discussed tapering up. Every 3 days he can increase his dose by 2 units until his blood sugar is under 150.  Abnormal vision-most likely secondary to elevated glucose. He plans on getting an eye exam. I think getting his blood sugars back under control will help greatly as well.  Erectile dysfunction-some of this may be related to his out of control blood sugars. Discussed that we need to get his back under control and see if this helps. If not then he may need further workup and evaluation. Next  Left-sided sciatica-recommend x-ray for further  evaluation since this has been going on for at least 2 months at this point. We'll give him a handout on exercises to do on his own at home. I would consider a round of prednisone with his blood sugar being in the 400s recently I want to avoid raising them even more. Next  Finger pain-we'll refer to sports medicine for further evaluation.  Time spent 45 minutes, REM 50% time spent counseling about blood pressure, diabetes, abnormal vision, rectal dysfunction, and sciatica.

## 2015-04-09 NOTE — Patient Instructions (Signed)
Start with 10 units of Toujeo at bedtime.  After 3 days you can increase the dose by 2 units if you morning sugar is greater than 150.   Continue to increase every 3 days but 2 units as needed.

## 2015-04-09 NOTE — Addendum Note (Signed)
Addended by: Collie Siad on: 04/09/2015 01:15 PM   Modules accepted: Orders

## 2015-04-10 NOTE — Telephone Encounter (Signed)
Spoke with patient and went over the signs of DKA.  He stated that his BG was 280 this am.

## 2015-04-10 NOTE — Telephone Encounter (Signed)
Left message to call to discuss lab results. 

## 2015-04-11 ENCOUNTER — Encounter: Payer: Self-pay | Admitting: Physician Assistant

## 2015-04-11 DIAGNOSIS — E785 Hyperlipidemia, unspecified: Secondary | ICD-10-CM | POA: Insufficient documentation

## 2015-04-11 NOTE — Telephone Encounter (Signed)
Pt called back and stated that he can't get the Invokana until Monday. He does have some medication that he got from [facility] Metformin. Pt advised that if this is out of date not to use. appt made for Dr.Corey for Monday. [name], Viann Shove

## 2015-04-14 ENCOUNTER — Ambulatory Visit: Payer: BLUE CROSS/BLUE SHIELD | Admitting: Family Medicine

## 2015-04-15 ENCOUNTER — Encounter: Payer: Self-pay | Admitting: Family Medicine

## 2015-04-15 ENCOUNTER — Ambulatory Visit (INDEPENDENT_AMBULATORY_CARE_PROVIDER_SITE_OTHER): Payer: BLUE CROSS/BLUE SHIELD | Admitting: Family Medicine

## 2015-04-15 VITALS — BP 158/91 | HR 81 | Wt 171.0 lb

## 2015-04-15 DIAGNOSIS — M79605 Pain in left leg: Secondary | ICD-10-CM | POA: Insufficient documentation

## 2015-04-15 DIAGNOSIS — M545 Low back pain, unspecified: Secondary | ICD-10-CM

## 2015-04-15 DIAGNOSIS — M65312 Trigger thumb, left thumb: Secondary | ICD-10-CM

## 2015-04-15 MED ORDER — AMITRIPTYLINE HCL 25 MG PO TABS: 25.0000 mg | ORAL_TABLET | Freq: Every evening | ORAL | Status: DC | PRN

## 2015-04-15 NOTE — Assessment & Plan Note (Signed)
Patient has left trigger thumb now for 3 months. Discussed options. Patient declined injection today based on risk of hyperglycemia. Plan for conservative management with double Band-Aid splint of the IP joint. Return if not improving or worsening.

## 2015-04-15 NOTE — Assessment & Plan Note (Signed)
Lumbar radiculopathy present. Symptoms are moderate and now becoming chronic. Discussed options. Patient would like to avoid oral steroids due to risk of hyperglycemia. He is attempting to control his diabetes with his PCP. He has tried home exercises and conservative management which have not helped. Discussed options. Plan to proceed with MRI for epidural steroid/surgical planning.

## 2015-04-15 NOTE — Patient Instructions (Signed)
Thank you for coming in today. Back: Get MRI.  Take amitryptline at night.  Return after seeing Dr Judie Petit or sooner if needed.  Come back or go to the emergency room if you notice new weakness new numbness problems walking or bowel or bladder problems.  Thumb: Use the splint.  Return as needed.   Sciatica Sciatica is pain, weakness, numbness, or tingling along the path of the sciatic nerve. The nerve starts in the lower back and runs down the back of each leg. The nerve controls the muscles in the lower leg and in the back of the knee, while also providing sensation to the back of the thigh, lower leg, and the sole of your foot. Sciatica is a symptom of another medical condition. For instance, nerve damage or certain conditions, such as a herniated disk or bone spur on the spine, pinch or put pressure on the sciatic nerve. This causes the pain, weakness, or other sensations normally associated with sciatica. Generally, sciatica only affects one side of the body. CAUSES   Herniated or slipped disc.  Degenerative disk disease.  A pain disorder involving the narrow muscle in the buttocks (piriformis syndrome).  Pelvic injury or fracture.  Pregnancy.  Tumor (rare). SYMPTOMS  Symptoms can vary from mild to very severe. The symptoms usually travel from the low back to the buttocks and down the back of the leg. Symptoms can include:  Mild tingling or dull aches in the lower back, leg, or hip.  Numbness in the back of the calf or sole of the foot.  Burning sensations in the lower back, leg, or hip.  Sharp pains in the lower back, leg, or hip.  Leg weakness.  Severe back pain inhibiting movement. These symptoms may get worse with coughing, sneezing, laughing, or prolonged sitting or standing. Also, being overweight may worsen symptoms. DIAGNOSIS  Your caregiver will perform a physical exam to look for common symptoms of sciatica. He or she may ask you to do certain movements or activities  that would trigger sciatic nerve pain. Other tests may be performed to find the cause of the sciatica. These may include:  Blood tests.  X-rays.  Imaging tests, such as an MRI or CT scan. TREATMENT  Treatment is directed at the cause of the sciatic pain. Sometimes, treatment is not necessary and the pain and discomfort goes away on its own. If treatment is needed, your caregiver may suggest:  Over-the-counter medicines to relieve pain.  Prescription medicines, such as anti-inflammatory medicine, muscle relaxants, or narcotics.  Applying heat or ice to the painful area.  Steroid injections to lessen pain, irritation, and inflammation around the nerve.  Reducing activity during periods of pain.  Exercising and stretching to strengthen your abdomen and improve flexibility of your spine. Your caregiver may suggest losing weight if the extra weight makes the back pain worse.  Physical therapy.  Surgery to eliminate what is pressing or pinching the nerve, such as a bone spur or part of a herniated disk. HOME CARE INSTRUCTIONS   Only take over-the-counter or prescription medicines for pain or discomfort as directed by your caregiver.  Apply ice to the affected area for 20 minutes, 3-4 times a day for the first 48-72 hours. Then try heat in the same way.  Exercise, stretch, or perform your usual activities if these do not aggravate your pain.  Attend physical therapy sessions as directed by your caregiver.  Keep all follow-up appointments as directed by your caregiver.  Do not  wear high heels or shoes that do not provide proper support.  Check your mattress to see if it is too soft. A firm mattress may lessen your pain and discomfort. SEEK IMMEDIATE MEDICAL CARE IF:   You lose control of your bowel or bladder (incontinence).  You have increasing weakness in the lower back, pelvis, buttocks, or legs.  You have redness or swelling of your back.  You have a burning sensation when  you urinate.  You have pain that gets worse when you lie down or awakens you at night.  Your pain is worse than you have experienced in the past.  Your pain is lasting longer than 4 weeks.  You are suddenly losing weight without reason. MAKE SURE YOU:  Understand these instructions.  Will watch your condition.  Will get help right away if you are not doing well or get worse.   This information is not intended to replace advice given to you by your health care provider. Make sure you discuss any questions you have with your health care provider.   Document Released: 02/16/2001 Document Revised: 11/13/2014 Document Reviewed: 07/04/2011 Elsevier Interactive Patient Education 2016 [employer]   Trigger Finger Trigger finger (digital tendinitis and stenosing tenosynovitis) is a common disorder that causes an often painful catching of the fingers or thumb. It occurs as a clicking, snapping, or locking of a finger in the palm of the hand. This is caused by a problem with the tendons that flex or bend the fingers sliding smoothly through their sheaths. The condition may occur in any finger or a couple fingers at the same time.  The finger may lock with the finger curled or suddenly straighten out with a snap. This is more common in patients with rheumatoid arthritis and diabetes. Left untreated, the condition may get worse to the point where the finger becomes locked in flexion, like making a fist, or less commonly locked with the finger straightened out. CAUSES   Inflammation and scarring that lead to swelling around the tendon sheath.  Repeated or forceful movements.  Rheumatoid arthritis, an autoimmune disease that affects joints.  Gout.  Diabetes mellitus. SIGNS AND SYMPTOMS  Soreness and swelling of your finger.  A painful clicking or snapping as you bend and straighten your finger. DIAGNOSIS  Your health care provider will do a physical exam of your finger to diagnose  trigger finger. TREATMENT   Splinting for 6-8 weeks may be helpful.  Nonsteroidal anti-inflammatory medicines (NSAIDs) can help to relieve the pain and inflammation.  Cortisone injections, along with splinting, may speed up recovery. Several injections may be required. Cortisone may give relief after one injection.  Surgery is another treatment that may be used if conservative treatments do not work. Surgery can be minor, without incisions (a cut does not have to be made), and can be done with a needle through the skin.  Other surgical choices involve an open procedure in which the surgeon opens the hand through a small incision and cuts the pulley so the tendon can again slide smoothly. Your hand will still work fine. HOME CARE INSTRUCTIONS  Apply ice to the injured area, twice per day:  Put ice in a plastic bag.  Place a towel between your skin and the bag.  Leave the ice on for 20 minutes, 3-4 times a day.  Rest your hand often. MAKE SURE YOU:   Understand these instructions.  Will watch your condition.  Will get help right away if you are not  doing well or get worse.   This information is not intended to replace advice given to you by your health care provider. Make sure you discuss any questions you have with your health care provider.   Document Released: 12/13/2003 Document Revised: 10/25/2012 Document Reviewed: 07/25/2012 Elsevier Interactive Patient Education [employer]

## 2015-04-15 NOTE — Progress Notes (Signed)
   Subjective:    I'm seeing this patient as a consultation for:  Dr. Linford Arnold  CC: Sciatica and thumb pain  HPI:  1) sciatica: Patient notes a 8 month history of left-sided low back pain radiating to the left lateral calf. He notes intermittent weakness none in the last several months. He denies any bowel bladder dysfunction. He denies any fevers or chills or injury. He takes ibuprofen for pain which helps. He recently was seen by his PCP who can x-ray which shows some degenerative changes in his lumbar spine. His symptoms are moderate bothersome. He is able to continue working. He uses ibuprofen for pain control which does help. His symptoms are quite bothersome especially at bedtime.  2) trigger thumb: Patient notes a 3 month history of pain and triggering of his left thumb. He notes that his interphalangeal joint sometimes becomes stuck in the flexed position requiring a painful pop to open the thumb joint. He feels a nodule on the palmar MCP. He has not had any specific treatment for this yet. Symptoms are moderate. He is right-handed. He is able to continue to work.  Past medical history, Surgical history, Family history not pertinant except as noted below, Social history, Allergies, and medications have been entered into the medical record, reviewed, and no changes needed.   Review of Systems: No headache, visual changes, nausea, vomiting, diarrhea, constipation, dizziness, abdominal pain, skin rash, fevers, chills, night sweats, weight loss, swollen lymph nodes, body aches, joint swelling, muscle aches, chest pain, shortness of breath, mood changes, visual or auditory hallucinations.   Objective:    Filed Vitals:   04/15/15 0826  BP: 158/91  Pulse: 81   General: Well Developed, well nourished, and in no acute distress.  Neuro/Psych: Alert and oriented x3, extra-ocular muscles intact, able to move all 4 extremities, sensation grossly intact. Skin: Warm and dry, no rashes noted.    Respiratory: Not using accessory muscles, speaking in full sentences, trachea midline.  Cardiovascular: Pulses palpable, no extremity edema. Abdomen: Does not appear distended. MSK: Back: Nontender to spinal midline. Normal back range of motion. Lower extremity sensation strength and reflexes are equal and normal bilaterally. Normal gait. Positive left-sided slump test.  Left thumb normal-appearing nontender. Palpable nodule at the volar MCP left thumb. Nodule moves with flexion of the interphalangeal joint. He has triggering of the IP joint. His left hand and wrist are otherwise normal appearing and nontender with normal pulses capillary refill and sensation.  X-ray lumbar spine dated 04/09/2015 reviewed.  No results found for this or any previous visit (from the past 24 hour(s)). No results found.  Impression and Recommendations:   This case required medical decision making of moderate complexity.

## 2015-04-16 ENCOUNTER — Telehealth: Payer: Self-pay | Admitting: Family Medicine

## 2015-04-16 NOTE — Telephone Encounter (Signed)
Call pt: Sodium was low on labs. Repeat in one week.  Glucose was over 400 so likely had shifted the sodium level.  Please see if he was able to get all his meds. Cholesterol is up. Based on current guildes I would recommend  A cholesterol lowering med at bedtime.  We can discuss more at the next OV in March.

## 2015-04-17 ENCOUNTER — Other Ambulatory Visit: Payer: Self-pay | Admitting: Family Medicine

## 2015-04-17 DIAGNOSIS — M79605 Pain in left leg: Secondary | ICD-10-CM

## 2015-04-17 DIAGNOSIS — E870 Hyperosmolality and hypernatremia: Secondary | ICD-10-CM

## 2015-04-17 DIAGNOSIS — M545 Low back pain: Secondary | ICD-10-CM

## 2015-04-17 MED ORDER — AMITRIPTYLINE HCL 25 MG PO TABS: 25.0000 mg | ORAL_TABLET | Freq: Every evening | ORAL | Status: DC | PRN

## 2015-04-17 MED ORDER — ATORVASTATIN CALCIUM 40 MG PO TABS: 40.0000 mg | ORAL_TABLET | Freq: Every day | ORAL | Status: DC

## 2015-04-17 NOTE — Telephone Encounter (Addendum)
These blood sugars sound great. Continue taking the medication even if the sugar level looks great GET any medication dosing with the pills or with the insulin. Sent over new prescription for the cholesterol medication to [facility].

## 2015-04-17 NOTE — Telephone Encounter (Signed)
Spoke with Pt, he will get lab work redone when he comes in for his MRI (per Dr. Denyse Amass).   Pt states he got all of his medications on Tuesday (04/15/15) and has been taking the "diabetic pill" in the morning and using 12 units of insulin at night. Pt reports his blood sugar was 154 yesterday am prior to any am Rx's, and it was 124 this morning prior to any am Rx's.   Pt is willing to start a Cholesterol Rx, would like this sent to [facility] in [place]. This pharmacy has been updated in the system.   Pt was also given an Rx from Dr. Denyse Amass at last appt which was sent to the wrong pharmacy, will resend to correct pharmacy at this time. No further questions.

## 2015-04-21 ENCOUNTER — Ambulatory Visit (INDEPENDENT_AMBULATORY_CARE_PROVIDER_SITE_OTHER): Payer: BLUE CROSS/BLUE SHIELD

## 2015-04-21 DIAGNOSIS — M545 Low back pain, unspecified: Secondary | ICD-10-CM

## 2015-04-21 DIAGNOSIS — M4806 Spinal stenosis, lumbar region: Secondary | ICD-10-CM

## 2015-04-21 DIAGNOSIS — M79605 Pain in left leg: Secondary | ICD-10-CM

## 2015-04-23 ENCOUNTER — Ambulatory Visit (INDEPENDENT_AMBULATORY_CARE_PROVIDER_SITE_OTHER): Payer: BLUE CROSS/BLUE SHIELD | Admitting: Family Medicine

## 2015-04-23 ENCOUNTER — Encounter: Payer: Self-pay | Admitting: Family Medicine

## 2015-04-23 VITALS — BP 135/84 | HR 88 | Wt 172.0 lb

## 2015-04-23 DIAGNOSIS — M545 Low back pain, unspecified: Secondary | ICD-10-CM

## 2015-04-23 DIAGNOSIS — M79605 Pain in left leg: Secondary | ICD-10-CM

## 2015-04-23 NOTE — Assessment & Plan Note (Signed)
Plan for lumbar epidural steroid injection. Return following injection. Reviewed MRI findings and discussed risks and benefits procedure with patient who expresses understanding and agreement.

## 2015-04-23 NOTE — Patient Instructions (Signed)
Thank you for coming in today. Return following injection.   Call if you do not hear from the radiology group about the steroid injection.   Epidural Steroid Injection, Care After Refer to this sheet in the next few weeks. These instructions provide you with information on caring for yourself after your procedure. Your health care provider may also give you more specific instructions. Your treatment has been planned according to current medical practices, but problems sometimes occur. Call your health care provider if you have any problems or questions after your procedure. WHAT TO EXPECT AFTER THE PROCEDURE After your procedure, it is typical to have minimal discomfort at the injection site. HOME CARE INSTRUCTIONS   Avoid the use of heat on the injection site for 1 day.  Do not take a tub bath or soak in water the day of the procedure.  Remove the bandage on the day after the procedure.  Resume your normal activities the day after the procedure.  Apply ice to reduce the soreness around the injection site:  Put ice in a plastic bag.  Place a towel between your skin and the bag.  Leave the ice on for 20 minutes, 2-3 times a day.  Follow up with your health care provider 7-10 days after the procedure. SEEK MEDICAL CARE IF:   You have a fever.  You continue to have pain and soreness around the injection site, even after taking medicines.  You have significant nausea or vomiting.  You have a severe headache. SEEK IMMEDIATE MEDICAL CARE IF:   You have severe pain at the injection site, which is not relieved by medicines.  You have a severe headache, stiff neck, or sensitivity to light.  You have any new numbness or weakness in your legs or arms.  You lose control over your bladder or bowel movements.  You have difficulty breathing.   This information is not intended to replace advice given to you by your health care provider. Make sure you discuss any questions you have  with your health care provider.   Document Released: 06/09/2010 Document Revised: 03/15/2014 Document Reviewed: 08/11/2012 Elsevier Interactive Patient Education [employer]

## 2015-04-23 NOTE — Progress Notes (Signed)
Quick Note:  Labs show compression of the left L5 nerve root. We can try an epidural steroid injection. Return to clinic for further discussion of results. ______

## 2015-04-23 NOTE — Progress Notes (Signed)
Larry Lawson is a 45 y.o. male who presents to [facility] today for follow-up back pain and lumbar radiculopathy and MRI results. Patient has left leg lumbar radiculopathy. He had a recent MRI which showed L5 nerve root compression on the left side. He is here today for MRI review. He notes continued pain. He denies any weakness or bowel or bladder dysfunction. He is reluctant to pursue oral steroids given his diabetes.   Past Medical History  Diagnosis Date  . Heart murmur   . Heart murmur   . Hypertension   . Diabetes mellitus    No past surgical history on file. Social History  Substance Use Topics  . Smoking status: Light Tobacco Smoker -- 0.50 packs/day  . Smokeless tobacco: Current User  . Alcohol Use: Yes   family history includes Diabetes in his brother; Heart attack (age of onset: 23) in his brother; Heart disease in his paternal uncle; Heart failure in his brother and mother; Hyperlipidemia in his mother; Hypertension in his father; Prostate cancer in his father.  ROS as above Medications: Current Outpatient Prescriptions  Medication Sig Dispense Refill  . AMBULATORY NON FORMULARY MEDICATION Medication Name: Glucometer with strips and lancets to check daily.  Dx: Diabetes 250.00 1 Units 3  . amitriptyline (ELAVIL) 25 MG tablet Take 1-2 tablets (25-50 mg total) by mouth at bedtime as needed (pain). 60 tablet 0  . atorvastatin (LIPITOR) 40 MG tablet Take 1 tablet (40 mg total) by mouth daily. 90 tablet 3  . Canagliflozin-Metformin HCl ER (INVOKAMET XR) [phone] MG TB24 Take 1 tablet by mouth daily. 60 tablet 5  . Insulin Glargine (TOUJEO SOLOSTAR) 300 UNIT/ML SOPN Inject 10 Units into the skin at bedtime. Increase by 3 units every 5 days until gets to 100-120 fasting glucose or 40 units at bedtime. 3 pen 2  . Insulin Pen Needle (VALUMARK PEN NEEDLES) 31G X 8 MM MISC Use  with insulin pen to administer insulin. Dx: E11.9 100 each 1  . losartan (COZAAR) 50 MG tablet Take 1 tablet (50 mg total) by mouth daily. 90 tablet 1  . VENTOLIN HFA 108 (90 BASE) MCG/ACT inhaler INHALE 2 PUFFS INTO THE LUNGS EVERY 6 HOURS AS NEEDED FOR WHEEZING. 18 each 0   No current facility-administered medications for this visit.   Allergies  Allergen Reactions  . Sulfonamide Derivatives     REACTION: pt. can't remember reaction     Exam:  BP 135/84 mmHg  Pulse 88  Wt 172 lb (78.019 kg) Gen: Well NAD Lower extremity strength is intact throughout. Normal gait.  MRI L-spine dated 04/21/2015 reviewed.   CLINICAL DATA: Lumbar pain with left leg radiculopathy in an L5 distribution. Hyperextension injury 09/2014.  EXAM: MRI LUMBAR SPINE WITHOUT CONTRAST  TECHNIQUE: Multiplanar, multisequence MR imaging of the lumbar spine was performed. No intravenous contrast was administered.  COMPARISON: Lumbar spine radiographs 04/09/2015  FINDINGS: Transitional lumbosacral anatomy as described on prior radiographs. Numbering preserved from that study, with the transitional segment considered a nearly completely lumbarized S1.  Vertebral alignment is normal. Vertebral body heights are preserved. A small L1 superior endplate Schmorl's node is noted. Moderate disc space height loss and mild degenerative endplate edema are present at L4-5. Disc desiccation is noted at L4-5 and L5-S1. There is also moderate disc space height loss at L1-2. Conus medullaris is normal in signal and terminates at the mid to upper L2 level. Paraspinal soft tissues are  unremarkable.  L1-2: Mild circumferential disc bulging results in mild left greater than right lateral recess narrowing without spinal canal or neural foraminal stenosis.  L2-3: Mild disc bulging results in mild left lateral recess narrowing without spinal canal or neural foraminal stenosis.  L3-4: Mild disc bulging and slight  facet hypertrophy result in minimal bilateral lateral recess narrowing without spinal canal or neural foraminal stenosis.  L4-5: Moderate-sized left paracentral disc extrusion with caudal migration to the L5 pedicle level and mild left facet hypertrophy result in moderate left lateral recess stenosis, potentially affecting the left L5 nerve root. There is minimal right lateral recess stenosis and mild overall spinal stenosis. Disc bulging asymmetric to the left and disc space height loss contribute to moderate left neural foraminal stenosis.  L5-S1: Mild disc bulging, small right foraminal/ extraforaminal disc osteophyte complex, and mild facet hypertrophy result in mild bilateral lateral recess stenosis and moderate right and mild left neural foraminal stenosis. No spinal stenosis.  S1-2: Mild facet arthrosis without disc herniation or stenosis.  IMPRESSION: 1. Transitional lumbosacral anatomy as above. 2. 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 3. Mild spinal stenosis and moderate left neural foraminal stenosis at L4-5. 4. Moderate right neural foraminal stenosis at L5-S1.   Electronically Signed  By: Sebastian Ache M.D.  On: 04/21/2015 13:12   No results found for this or any previous visit (from the past 24 hour(s)). No results found.   Please see individual assessment and plan sections.  CC METHENEY,CATHERINE, MD

## 2015-04-25 ENCOUNTER — Encounter: Payer: Self-pay | Admitting: Family Medicine

## 2015-04-28 NOTE — Telephone Encounter (Signed)
Pt got cholesterol Rx and has been taking it with no side effects.   Pt does report he increased his insulin from 12 units to 14 units and his sugar is still in the upper 180's. Pt states he is going to inject 16 units of insulin tonight. Questioned if this was due to diet, Pt states he has been "eating a lot of carbs" and drinking a "lot of diet soda." Informed Pt of some health alternatives. Pt is going in for back epidural tomorrow and will have steroid injected, Pt advised this will spike his sugar short term. Verbalized understanding and states the office doing the procedure had already informed him of this side effect. No further questions.

## 2015-04-29 ENCOUNTER — Ambulatory Visit
Admission: RE | Admit: 2015-04-29 | Discharge: 2015-04-29 | Disposition: A | Discharge status: Home / Self Care | Payer: BLUE CROSS/BLUE SHIELD | Source: Ambulatory Visit | Attending: Family Medicine | Admitting: Family Medicine

## 2015-04-29 VITALS — BP 131/92 | HR 98

## 2015-04-29 DIAGNOSIS — M545 Low back pain, unspecified: Secondary | ICD-10-CM

## 2015-04-29 DIAGNOSIS — M79605 Pain in left leg: Secondary | ICD-10-CM

## 2015-04-29 MED ORDER — METHYLPREDNISOLONE ACETATE 40 MG/ML INJ SUSP (RADIOLOG
120.0000 mg | Freq: Once | INTRAMUSCULAR | Status: AC
  Administered 2015-04-29: 120 mg via EPIDURAL

## 2015-04-29 MED ORDER — IOHEXOL 180 MG/ML  SOLN
1.0000 mL | Freq: Once | INTRAMUSCULAR | Status: AC | PRN
  Administered 2015-04-29: 1 mL via EPIDURAL

## 2015-04-29 NOTE — Telephone Encounter (Signed)
Perfect 16 units.  If sugar is still high after 3 days then increase to 18 units.  Ok to increase by 2 units every 3 days until sugars under 130.  Good job counseling Kelsi.

## 2015-04-29 NOTE — Discharge Instructions (Signed)

## 2015-04-30 LAB — COMPLETE METABOLIC PANEL WITH GFR
ALBUMIN: 4.2 g/dL (ref 3.6–5.1)
ALT: 15 U/L (ref 9–46)
AST: 16 U/L (ref 10–40)
Alkaline Phosphatase: 103 U/L (ref 40–115)
BUN: 15 mg/dL (ref 7–25)
CHLORIDE: 98 mmol/L (ref 98–110)
CO2: 28 mmol/L (ref 20–31)
CREATININE: 0.83 mg/dL (ref 0.60–1.35)
Calcium: 9.7 mg/dL (ref 8.6–10.3)
GFR, Est African American: >89 mL/min (ref >=60)
GFR, Est Non African American: >89 mL/min (ref >=60)
GLUCOSE: 162 mg/dL — AB (ref 65–99)
POTASSIUM: 4.7 mmol/L (ref 3.5–5.3)
SODIUM: 137 mmol/L (ref 135–146)
Total Bilirubin: 0.3 mg/dL (ref 0.2–1.2)
Total Protein: 6.6 g/dL (ref 6.1–8.1)

## 2015-04-30 NOTE — Progress Notes (Signed)
Quick Note:  All labs are normal. ______ 

## 2015-05-05 ENCOUNTER — Other Ambulatory Visit: Payer: BLUE CROSS/BLUE SHIELD

## 2015-05-21 ENCOUNTER — Ambulatory Visit (INDEPENDENT_AMBULATORY_CARE_PROVIDER_SITE_OTHER): Payer: BLUE CROSS/BLUE SHIELD | Admitting: Family Medicine

## 2015-05-21 ENCOUNTER — Encounter: Payer: Self-pay | Admitting: Family Medicine

## 2015-05-21 VITALS — BP 138/72 | HR 70 | Wt 175.0 lb

## 2015-05-21 DIAGNOSIS — Z794 Long term (current) use of insulin: Secondary | ICD-10-CM | POA: Diagnosis not present

## 2015-05-21 DIAGNOSIS — N529 Male erectile dysfunction, unspecified: Secondary | ICD-10-CM

## 2015-05-21 DIAGNOSIS — E118 Type 2 diabetes mellitus with unspecified complications: Secondary | ICD-10-CM

## 2015-05-21 DIAGNOSIS — I10 Essential (primary) hypertension: Secondary | ICD-10-CM | POA: Diagnosis not present

## 2015-05-21 MED ORDER — TADALAFIL 20 MG PO TABS: 10.0000 mg | ORAL_TABLET | ORAL | Status: DC | PRN

## 2015-05-21 NOTE — Patient Instructions (Signed)
Please remember to schedule your eye exam. 

## 2015-05-21 NOTE — Progress Notes (Signed)
   Subjective:    Patient ID: Larry SafeJoseph M Niess, male    DOB: 1970-08-23, 45 y.o.   MRN: [id number]  HPI Diabetes - no hypoglycemic events. No wounds or sores that are not healing well. No increased thirst or urination. Checking glucose at home. Taking medications as prescribed without any side effects.He is currently on and the command and Toujeo. He is up to 18 units and blood sugars have been running in the 140s to 160s. Before that he was seeing numbers in the low 100s. He did eat some ice cream and some chocolate over the last few days.  Hypertension- Pt denies chest pain, SOB, dizziness, or heart palpitations.  Taking meds as directed w/o problems.  Denies medication side effects.    ED- He originally spoken to me about difficulty with erectile dysfunction. We decided to try to get his blood sugars under better control before starting medication to see if that would help. Even though his sugars are now back in 20 normal range she is still having difficulty. He would like to try medication.  Review of Systems     Objective:   Physical Exam  Constitutional: He is oriented to person, place, and time. He appears well-developed and well-nourished.  HENT:  Head: Normocephalic and atraumatic.  Cardiovascular: Normal rate, regular rhythm and normal heart sounds.   Pulmonary/Chest: Effort normal and breath sounds normal.  Neurological: He is alert and oriented to person, place, and time.  Skin: Skin is warm and dry.  Psychiatric: He has a normal mood and affect. His behavior is normal.          Assessment & Plan:  DM- He is making great progress in getting his blood sugars under better control. Continue with diet and exercise. Continue with his current regimen. We can always have room to increase the number, if we needed. Follow-up at the beginning of May for Next  A1c.  His on a statin and ARB.  Lab Results  Component Value Date   HGBA1C 13.1 04/09/2015   HTN - Initially elevated when  he first arrived but repeat blood pressure looked great. We'll continue to monitor carefully.  Erectile dysfunction-discussed a trial of Cialis. Prescription given with coupon card provided. One about potential side effects. He can follow-up when I see him back in about 7 weeks to see how well this is working.

## 2015-05-22 ENCOUNTER — Telehealth: Payer: Self-pay | Admitting: *Deleted

## 2015-05-22 NOTE — Telephone Encounter (Signed)
PA initiated through covermymeds 

## 2015-05-27 NOTE — Telephone Encounter (Signed)
cialis has been denied due to patient not having tried Viagra. Denial letter placed in [employer]'s box

## 2015-06-16 ENCOUNTER — Telehealth: Payer: Self-pay | Admitting: *Deleted

## 2015-06-16 NOTE — Telephone Encounter (Signed)
Per metheney called patient on 06/05/15 to see if Viagra was ok to try. Have not heard back. Called again today. Sending denial letter to scan

## 2015-06-26 MED ORDER — SILDENAFIL CITRATE 100 MG PO TABS: 50.0000 mg | ORAL_TABLET | Freq: Every day | ORAL | Status: DC | PRN

## 2015-06-26 NOTE — Telephone Encounter (Signed)
Pt called back and he is ok with trying Viagra

## 2015-06-26 NOTE — Telephone Encounter (Signed)
New Rx sent.

## 2015-07-02 ENCOUNTER — Other Ambulatory Visit: Payer: Self-pay | Admitting: Physician Assistant

## 2015-07-07 ENCOUNTER — Ambulatory Visit (INDEPENDENT_AMBULATORY_CARE_PROVIDER_SITE_OTHER): Payer: BLUE CROSS/BLUE SHIELD | Admitting: Family Medicine

## 2015-07-07 ENCOUNTER — Encounter: Payer: Self-pay | Admitting: Family Medicine

## 2015-07-07 VITALS — BP 132/72 | HR 78 | Wt 171.0 lb

## 2015-07-07 DIAGNOSIS — Z794 Long term (current) use of insulin: Secondary | ICD-10-CM | POA: Diagnosis not present

## 2015-07-07 DIAGNOSIS — N529 Male erectile dysfunction, unspecified: Secondary | ICD-10-CM

## 2015-07-07 DIAGNOSIS — E118 Type 2 diabetes mellitus with unspecified complications: Secondary | ICD-10-CM | POA: Diagnosis not present

## 2015-07-07 DIAGNOSIS — I10 Essential (primary) hypertension: Secondary | ICD-10-CM

## 2015-07-07 LAB — POCT GLYCOSYLATED HEMOGLOBIN (HGB A1C): Hemoglobin A1C: 8.3

## 2015-07-07 MED ORDER — ATORVASTATIN CALCIUM 40 MG PO TABS: 40.0000 mg | ORAL_TABLET | Freq: Every day | ORAL | Status: DC

## 2015-07-07 NOTE — Progress Notes (Signed)
Subjective:    Patient ID: Larry Lawson, male    DOB: 24-Jul-1970, 45 y.o.   MRN: [id number]  HPI Diabetes - no hypoglycemic events. No wounds or sores that are not healing well. No increased thirst or urination. Checking glucose at home. Taking medications as prescribed without any side effects.He admits he's been out of his medication from his 2 weeks but did just get it filled. Declined to get eye exam at this point in time.  He says home blood sugars were running around the 150 range before he ran a medication a couple weeks ago.  Hypertension- Pt denies chest pain, SOB, dizziness, or heart palpitations.  Taking meds as directed w/o problems.  Denies medication side effects.    ED - I last saw him gave him a prescription for a trial of Cialis. Unfortunately was not covered with insurance we have switched Viagra. His insurance will only cover 4 tabs per month. He did notice benefit. He says it worked some but not great.  Review of Systems  BP 132/72 mmHg  Pulse 78  Wt 171 lb (77.565 kg)  SpO2 99%    Allergies  Allergen Reactions  . Sulfonamide Derivatives     REACTION: pt. can't remember reaction    Past Medical History  Diagnosis Date  . Heart murmur   . Heart murmur   . Hypertension   . Diabetes mellitus     No past surgical history on file.  Social History   Social History  . Marital Status: Married    Spouse Name: N/A  . Number of Children: N/A  . Years of Education: N/A   Occupational History  . Not on file.   Social History Main Topics  . Smoking status: Light Tobacco Smoker -- 0.50 packs/day  . Smokeless tobacco: Current User  . Alcohol Use: Yes  . Drug Use: No  . 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   Other Topics Concern  . Not on file   Social History Narrative    Family History  Problem Relation Age of Onset  . Cancer      grandmother  . Diabetes  Brother   . Hyperlipidemia Mother   . Hypertension Father   . Prostate cancer Father   . Heart attack      grandfather  . Heart attack Brother 46  . Heart failure Brother   . Heart failure Mother   . Heart disease Paternal Uncle     Outpatient Encounter Prescriptions as of 07/07/2015  Medication Sig  . AMBULATORY NON FORMULARY MEDICATION Medication Name: Glucometer with strips and lancets to check daily.  Dx: Diabetes 250.00  . atorvastatin (LIPITOR) 40 MG tablet Take 1 tablet (40 mg total) by mouth daily.  . Canagliflozin-Metformin HCl ER (INVOKAMET XR) [phone] MG TB24 Take 1 tablet by mouth daily.  . Insulin Pen Needle ([place]UMARK PEN NEEDLES) 31G X 8 MM MISC Use with insulin pen to administer insulin. Dx: E11.9  . losartan (COZAAR) 50 MG tablet Take 1 tablet (50 mg total) by mouth daily.  . sildenafil (VIAGRA) 100 MG tablet Take 0.5-1 tablets (50-100 mg total) by mouth daily as needed for erectile dysfunction.  . TOUJEO SOLOSTAR 300 UNIT/ML SOPN INJECT 10 UNITS INTO THE SKIN AT BEDTIME. INCREASE BY 3 UNITS EVERY 5DAYS UNTIL GETS TO 100-120 FASTING GLUCOSE OR 40 UNITS AT BEDTIME.  . VENTOLIN HFA 108 (90 BASE)  MCG/ACT inhaler INHALE 2 PUFFS INTO THE LUNGS EVERY 6 HOURS AS NEEDED FOR WHEEZING.  . [DISCONTINUED] atorvastatin (LIPITOR) 40 MG tablet Take 1 tablet (40 mg total) by mouth daily.  . [DISCONTINUED] amitriptyline (ELAVIL) 25 MG tablet Take 1-2 tablets (25-50 mg total) by mouth at bedtime as needed (pain).   No facility-administered encounter medications on file as of 07/07/2015.          Objective:   Physical Exam  Constitutional: He is oriented to person, place, and time. He appears well-developed and well-nourished.  HENT:  Head: Normocephalic and atraumatic.  Neck: Neck supple. No thyromegaly present.  Cardiovascular: Normal rate, regular rhythm and normal heart sounds.   Pulmonary/Chest: Effort normal and breath sounds normal.  Lymphadenopathy:    He has no cervical  adenopathy.  Neurological: He is alert and oriented to person, place, and time.  Skin: Skin is warm and dry.  Psychiatric: He has a normal mood and affect. His behavior is normal.        Assessment & Plan:  DM- Much improved. Hemoglobin A1c down to 8.3 which is absolutely fantastic and that's actually with him being off medication for the last 2 weeks. Continue current regimen. Follow up in 3 months. He is on a statin and an ARB. Lab Results  Component Value Date   HGBA1C 13.1 04/09/2015   HTN - Well controlled. Continue current regimen. Follow up in 3-4 months.   Erectile dysfunction-doing well with Viagra. Since his insurance will only pay for 4 tabs per month made the recommendation that he might want to consider getting it through [facility] or he can use the sildenafil 20 mg tabs and take 5. Taking it 50 tabs for $100 cash. Gave him a handout with additional information for this.

## 2015-09-20 ENCOUNTER — Other Ambulatory Visit: Payer: Self-pay | Admitting: Family Medicine

## 2015-10-07 ENCOUNTER — Ambulatory Visit: Payer: BLUE CROSS/BLUE SHIELD | Admitting: Family Medicine

## 2015-10-15 ENCOUNTER — Ambulatory Visit: Payer: BLUE CROSS/BLUE SHIELD | Admitting: Family Medicine

## 2015-11-28 ENCOUNTER — Ambulatory Visit: Payer: BLUE CROSS/BLUE SHIELD | Admitting: Family Medicine

## 2015-12-26 ENCOUNTER — Ambulatory Visit (INDEPENDENT_AMBULATORY_CARE_PROVIDER_SITE_OTHER): Payer: BLUE CROSS/BLUE SHIELD | Admitting: Family Medicine

## 2015-12-26 ENCOUNTER — Ambulatory Visit: Payer: Self-pay | Admitting: Family Medicine

## 2015-12-26 ENCOUNTER — Encounter: Payer: Self-pay | Admitting: Family Medicine

## 2015-12-26 DIAGNOSIS — Z205 Contact with and (suspected) exposure to viral hepatitis: Secondary | ICD-10-CM | POA: Diagnosis not present

## 2015-12-26 DIAGNOSIS — I10 Essential (primary) hypertension: Secondary | ICD-10-CM | POA: Diagnosis not present

## 2015-12-26 DIAGNOSIS — Z794 Long term (current) use of insulin: Secondary | ICD-10-CM | POA: Diagnosis not present

## 2015-12-26 DIAGNOSIS — R300 Dysuria: Secondary | ICD-10-CM

## 2015-12-26 DIAGNOSIS — Z125 Encounter for screening for malignant neoplasm of prostate: Secondary | ICD-10-CM | POA: Diagnosis not present

## 2015-12-26 DIAGNOSIS — R319 Hematuria, unspecified: Secondary | ICD-10-CM | POA: Diagnosis not present

## 2015-12-26 DIAGNOSIS — E118 Type 2 diabetes mellitus with unspecified complications: Secondary | ICD-10-CM

## 2015-12-26 DIAGNOSIS — N529 Male erectile dysfunction, unspecified: Secondary | ICD-10-CM

## 2015-12-26 LAB — POCT URINALYSIS DIPSTICK
BILIRUBIN UA: NEGATIVE
GLUCOSE UA: 500
KETONES UA: NEGATIVE
Leukocytes, UA: NEGATIVE
NITRITE UA: NEGATIVE
PH UA: 5
Protein, UA: NEGATIVE
RBC UA: NEGATIVE
SPEC GRAV UA: 1.01
Urobilinogen, UA: 0.2

## 2015-12-26 LAB — POCT GLYCOSYLATED HEMOGLOBIN (HGB A1C): Hemoglobin A1C: 6.8

## 2015-12-26 MED ORDER — CANAGLIFLOZIN-METFORMIN HCL ER 150-1000 MG PO TB24
1.0000 | ORAL_TABLET | Freq: Every day | ORAL | 5 refills | Status: DC
Start: 2015-12-26 — End: 2017-01-04

## 2015-12-26 MED ORDER — SILDENAFIL CITRATE 100 MG PO TABS: 50.0000 mg | ORAL_TABLET | Freq: Every day | ORAL | 99 refills | Status: DC | PRN

## 2015-12-26 MED ORDER — INSULIN GLARGINE 300 UNIT/ML ~~LOC~~ SOPN: 28.0000 [IU] | PEN_INJECTOR | Freq: Every day | SUBCUTANEOUS | 3 refills | Status: DC

## 2015-12-26 MED ORDER — LOSARTAN POTASSIUM 50 MG PO TABS: 50.0000 mg | ORAL_TABLET | Freq: Every day | ORAL | 1 refills | Status: DC

## 2015-12-26 NOTE — Progress Notes (Signed)
Subjective:    CC:   HPI:  45 year old male comes in today complaining of an episode of blood in his urine. He says it happened about a week ago on either Thursday or Friday night. He had taken a dose of Viagra that night. He says the urine looked almost thick. He says he did take a Viagra that night. He has taken it before and this is never happened before. He denied any flank pain. No fevers chills or dysuria. He says it has not happened since then. No prior history of kidney stones but that's when he is most worried about.He says he would also like to check his prostate because he has a family history of prostate cancer and has never been done before.  Diabetes-he is currently using 28 units of Toujeo. He said he did miss about 2 weeks the medication because he went out of town and forgot his medication. 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  Erectile dysfunction-he says he lost the prescription for the sildenafil. He would like it reprinted.     Past medical history, Surgical history, Family history not pertinant except as noted below, Social history, Allergies, and medications have been entered into the medical record, reviewed, and corrections made.   Review of Systems: No fevers, chills, night sweats, weight loss, chest pain, or shortness of breath.   Objective:    General: Well Developed, well nourished, and in no acute distress.  Neuro: Alert and oriented x3, extra-ocular muscles intact, sensation grossly intact.  HEENT: Normocephalic, atraumatic  Skin: Warm and dry, no rashes. Cardiac: Regular rate and rhythm, no murmurs rubs or gallops, no lower extremity edema.  Respiratory: Clear to auscultation bilaterally. Not using accessory muscles, speaking in full sentences.   Impression and Recommendations:   DM- Well controlled. Continue current regimen. Follow up in  3 months. Due for BMP. Lab Results  Component Value Date   HGBA1C 6.8 12/26/2015     Hematuria - Unclear etiology.  Urinalysis normal except for glucose. Unclear what may have caused this. Certainly could have been a kidney stone. Not sure if related to the Viagra but certainly if it happens again after taking Viagra then he needs to let me know.  ED - reprinted script for sildanefil.   Noncompliance-based on his medication refills he should be out of the most all of his medications but he says he is taking them.  Hypertension-uncontrolled though again compliance is clearly an issue. Encouraged him to take his medication as regularly as possible.  Due for hepatitis C screening.

## 2015-12-29 DIAGNOSIS — Z125 Encounter for screening for malignant neoplasm of prostate: Secondary | ICD-10-CM | POA: Diagnosis not present

## 2015-12-29 DIAGNOSIS — Z205 Contact with and (suspected) exposure to viral hepatitis: Secondary | ICD-10-CM | POA: Diagnosis not present

## 2015-12-29 DIAGNOSIS — I10 Essential (primary) hypertension: Secondary | ICD-10-CM | POA: Diagnosis not present

## 2015-12-29 DIAGNOSIS — E118 Type 2 diabetes mellitus with unspecified complications: Secondary | ICD-10-CM | POA: Diagnosis not present

## 2015-12-30 LAB — PSA: PSA: 1.6 ng/mL (ref <=4.0)

## 2015-12-30 LAB — BASIC METABOLIC PANEL WITH GFR
BUN: 18 mg/dL (ref 7–25)
CO2: 26 mmol/L (ref 20–31)
Calcium: 8.9 mg/dL (ref 8.6–10.3)
Chloride: 103 mmol/L (ref 98–110)
Creat: 0.84 mg/dL (ref 0.60–1.35)
GLUCOSE: 329 mg/dL — AB (ref 65–99)
POTASSIUM: 4.6 mmol/L (ref 3.5–5.3)
Sodium: 136 mmol/L (ref 135–146)

## 2015-12-30 LAB — HEPATITIS C ANTIBODY: HCV Ab: NEGATIVE

## 2016-01-19 ENCOUNTER — Other Ambulatory Visit: Payer: Self-pay | Admitting: Family Medicine

## 2016-04-08 ENCOUNTER — Ambulatory Visit: Payer: BLUE CROSS/BLUE SHIELD | Admitting: Family Medicine

## 2017-01-04 ENCOUNTER — Encounter: Payer: Self-pay | Admitting: Family Medicine

## 2017-01-04 ENCOUNTER — Ambulatory Visit (INDEPENDENT_AMBULATORY_CARE_PROVIDER_SITE_OTHER): Payer: Managed Care, Other (non HMO) | Admitting: Family Medicine

## 2017-01-04 VITALS — BP 173/88 | HR 73 | Wt 177.0 lb

## 2017-01-04 DIAGNOSIS — Q159 Congenital malformation of eye, unspecified: Secondary | ICD-10-CM | POA: Diagnosis not present

## 2017-01-04 DIAGNOSIS — E118 Type 2 diabetes mellitus with unspecified complications: Secondary | ICD-10-CM | POA: Diagnosis not present

## 2017-01-04 DIAGNOSIS — E7800 Pure hypercholesterolemia, unspecified: Secondary | ICD-10-CM | POA: Diagnosis not present

## 2017-01-04 DIAGNOSIS — Z794 Long term (current) use of insulin: Secondary | ICD-10-CM

## 2017-01-04 DIAGNOSIS — I10 Essential (primary) hypertension: Secondary | ICD-10-CM

## 2017-01-04 LAB — POCT GLYCOSYLATED HEMOGLOBIN (HGB A1C)

## 2017-01-04 MED ORDER — DAPAGLIFLOZIN PRO-METFORMIN ER 10-1000 MG PO TB24: 1.0000 | ORAL_TABLET | Freq: Every day | ORAL | 6 refills | Status: DC

## 2017-01-04 MED ORDER — ATORVASTATIN CALCIUM 40 MG PO TABS: 40.0000 mg | ORAL_TABLET | Freq: Every day | ORAL | 3 refills | Status: DC

## 2017-01-04 MED ORDER — LOSARTAN POTASSIUM 50 MG PO TABS: 50.0000 mg | ORAL_TABLET | Freq: Every day | ORAL | 1 refills | Status: DC

## 2017-01-04 MED ORDER — INSULIN GLARGINE 300 UNIT/ML ~~LOC~~ SOPN: 28.0000 [IU] | PEN_INJECTOR | Freq: Every day | SUBCUTANEOUS | 3 refills | Status: DC

## 2017-01-04 MED ORDER — SILDENAFIL CITRATE 100 MG PO TABS: 50.0000 mg | ORAL_TABLET | Freq: Every day | ORAL | 99 refills | Status: DC | PRN

## 2017-01-04 MED ORDER — INSULIN PEN NEEDLE 31G X 8 MM MISC: 11 refills | Status: DC

## 2017-01-04 NOTE — Progress Notes (Signed)
Subjective:    Patient ID: Larry Lawson, male    DOB: 15-Mar-1970, 46 y.o.   MRN: [id number]  HPI   46 year old male comes in today to get restarted on his medications.  He actually lost his health insurance earlier last year and has been without his medications for almost a year. Home sugars > 300.    Diabetes -he has noticed that he has had increased thirst and urination.  Has been having more severe headaches.  No vomiting.  About a month ago he was having some severe cramping.   Hypertension- Pt denies chest pain, SOB, dizziness, or heart palpitations.    He does get some numbness over the second toe on his left foot.  He also reports that he went for an eye exam back in [place] of state.  He was told that he had a "blood spot" in his left eye.  He was then seen by a specialist who told him that he would need definitive treatment and to wait until he got back here to start treatments because he could require multiple of them.  He is requesting referral to ophthalmology.   Review of Systems No chest pain or shortness of breath.  BP (!) 173/88   Pulse 73   Wt 177 lb (80.3 kg)   SpO2 98%   BMI 26.91 kg/m     Allergies  Allergen Reactions  . Sulfonamide Derivatives     REACTION: pt. can't remember reaction    Past Medical History:  Diagnosis Date  . Diabetes mellitus   . Heart murmur   . Heart murmur   . Hypertension     No past surgical history on file.  Social History   Social History  . Marital status: Married    Spouse name: N/A  . Number of children: N/A  . Years of education: N/A   Occupational History  . Not on file.   Social History Main Topics  . Smoking status: Light Tobacco Smoker    Packs/day: 0.50  . Smokeless tobacco: Current User  . Alcohol use Yes  . Drug use: No  . 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   Other Topics Concern  . Not on file    Social History Narrative  . No narrative on file    Family History  Problem Relation Age of Onset  . Cancer Unknown        grandmother  . Diabetes Brother   . Hyperlipidemia Mother   . Hypertension Father   . Prostate cancer Father   . Heart attack Unknown        grandfather  . Heart attack Brother 76  . Heart failure Brother   . Heart failure Mother   . Heart disease Paternal Uncle     Outpatient Encounter Prescriptions as of 01/04/2017  Medication Sig  . AMBULATORY NON FORMULARY MEDICATION Medication Name: Glucometer with strips and lancets to check daily.  Dx: Diabetes 250.00  . atorvastatin (LIPITOR) 40 MG tablet Take 1 tablet (40 mg total) by mouth daily.  . Dapagliflozin-Metformin HCl ER (XIGDUO XR) 12-998 MG TB24 Take 1 tablet by mouth daily with breakfast.  . Insulin Glargine (TOUJEO SOLOSTAR) 300 UNIT/ML SOPN Inject 28 Units into the skin at bedtime.  . Insulin Pen Needle (B-D ULTRAFINE III SHORT PEN) 31G X 8 MM MISC USE  TO ADMINISTER INSULIN, once daily  . losartan (  COZAAR) 50 MG tablet Take 1 tablet (50 mg total) by mouth daily.  . sildenafil (VIAGRA) 100 MG tablet Take 0.5-1 tablets (50-100 mg total) by mouth daily as needed for erectile dysfunction.  . VENTOLIN HFA 108 (90 BASE) MCG/ACT inhaler INHALE 2 PUFFS INTO THE LUNGS EVERY 6 HOURS AS NEEDED FOR WHEEZING. (Patient not taking: Reported on 01/04/2017)  . [DISCONTINUED] atorvastatin (LIPITOR) 40 MG tablet Take 1 tablet (40 mg total) by mouth daily. (Patient not taking: Reported on 01/04/2017)  . [DISCONTINUED] B-D ULTRAFINE III SHORT PEN 31G X 8 MM MISC USE  TO ADMINISTER INSULIN (Patient not taking: Reported on 01/04/2017)  . [DISCONTINUED] Canagliflozin-Metformin HCl ER (INVOKAMET XR) [phone] MG TB24 Take 1 tablet by mouth daily. (Patient not taking: Reported on 01/04/2017)  . [DISCONTINUED] Insulin Glargine (TOUJEO SOLOSTAR) 300 UNIT/ML SOPN Inject 28 Units into the skin at bedtime. (Patient not taking:  Reported on 01/04/2017)  . [DISCONTINUED] losartan (COZAAR) 50 MG tablet Take 1 tablet (50 mg total) by mouth daily. (Patient not taking: Reported on 01/04/2017)  . [DISCONTINUED] sildenafil (VIAGRA) 100 MG tablet Take 0.5-1 tablets (50-100 mg total) by mouth daily as needed for erectile dysfunction. (Patient not taking: Reported on 01/04/2017)   No facility-administered encounter medications on file as of 01/04/2017.          Objective:   Physical Exam  Constitutional: He is oriented to person, place, and time. He appears well-developed and well-nourished.  HENT:  Head: Normocephalic and atraumatic.  Cardiovascular: Normal rate, regular rhythm and normal heart sounds.   Pulmonary/Chest: Effort normal and breath sounds normal.  Neurological: He is alert and oriented to person, place, and time.  Skin: Skin is warm and dry.  Psychiatric: He has a normal mood and affect. His behavior is normal.        Assessment & Plan:  DM- Uncontrolled hemoglobin A1c greater than 14 today.  Will restart Toujeo.  Samples provided and coupon card provided.  He was previously on [place] met.  I changed him to [place] today.  He really needs to increase his water intake and avoid sweetened beverages and soda.  See him back in 6 weeks to see how he is doing.  HTN -uncontrolled.  Restart losartan.  Follow-up in 6 weeks.  Eye abnormality-unclear of his exact diagnosis.  I do not know if this is possibly a retinal bleed.  Will refer to ophthalmology in [facility] for more definitive care.  Hyperlipidemia - will restart statin. Plan to recheck in the future.

## 2017-01-05 LAB — CBC
HEMATOCRIT: 47 % (ref 38.5–50.0)
HEMOGLOBIN: 15.8 g/dL (ref 13.2–17.1)
MCH: 28.8 pg (ref 27.0–33.0)
MCHC: 33.6 g/dL (ref 32.0–36.0)
MCV: 85.6 fL (ref 80.0–100.0)
MPV: 10.9 fL (ref 7.5–12.5)
Platelets: 229 10*3/uL (ref 140–400)
RBC: 5.49 10*6/uL (ref 4.20–5.80)
RDW: 11.9 % (ref 11.0–15.0)
WBC: 8.5 10*3/uL (ref 3.8–10.8)

## 2017-01-05 LAB — COMPLETE METABOLIC PANEL WITH GFR
AG Ratio: 1.8 (calc) (ref 1.0–2.5)
ALKALINE PHOSPHATASE (APISO): 108 U/L (ref 40–115)
ALT: 17 U/L (ref 9–46)
AST: 17 U/L (ref 10–40)
Albumin: 4.1 g/dL (ref 3.6–5.1)
BILIRUBIN TOTAL: 0.4 mg/dL (ref 0.2–1.2)
BUN: 13 mg/dL (ref 7–25)
CO2: 31 mmol/L (ref 20–32)
CREATININE: 0.84 mg/dL (ref 0.60–1.35)
Calcium: 9.2 mg/dL (ref 8.6–10.3)
Chloride: 97 mmol/L — ABNORMAL LOW (ref 98–110)
GFR, EST NON AFRICAN AMERICAN: 104 mL/min/{1.73_m2} (ref >=60)
GFR, Est African American: 121 mL/min/{1.73_m2} (ref >=60)
GLUCOSE: 508 mg/dL — AB (ref 65–99)
Globulin: 2.3 g/dL (calc) (ref 1.9–3.7)
POTASSIUM: 4.4 mmol/L (ref 3.5–5.3)
Sodium: 135 mmol/L (ref 135–146)
TOTAL PROTEIN: 6.4 g/dL (ref 6.1–8.1)

## 2017-01-05 LAB — TSH: TSH: 1.9 mIU/L (ref 0.40–4.50)

## 2017-02-08 ENCOUNTER — Telehealth: Payer: Self-pay | Admitting: Family Medicine

## 2017-02-08 MED ORDER — BASAGLAR KWIKPEN 100 UNIT/ML ~~LOC~~ SOPN: 30.0000 [IU] | PEN_INJECTOR | Freq: Every day | SUBCUTANEOUS | 1 refills | Status: DC

## 2017-02-08 NOTE — Telephone Encounter (Signed)
Pt calls and states he contacted his insurance in regards to the insulin that they would cover. They cover: Basaglar- per ins. Will be 80.00 for 3 month supply. Levemir and the [place]. Pt states that he is out of insulin at this time. KG LPN

## 2017-02-08 NOTE — Telephone Encounter (Signed)
OK new rx sent for Basaglar.

## 2017-02-09 NOTE — Telephone Encounter (Signed)
Unable to leave a message, voicemail has not been set up.

## 2017-02-09 NOTE — Telephone Encounter (Signed)
Patient advised of recommendations.  

## 2017-02-15 ENCOUNTER — Ambulatory Visit: Payer: Managed Care, Other (non HMO) | Admitting: Family Medicine

## 2017-02-15 NOTE — Progress Notes (Deleted)
   Subjective:    Patient ID: Larry SafeJoseph M Oros, male    DOB: [birth year], 46 y.o.   MRN: [id number]  HPI Diabetes -he is here to follow-up early at the 6-week check because of a hemoglobin A1c greater than 14.  Unfortunately there was a delay in getting his insulin but he was able to finally get the Basaglar covered by his insurance.  No hypoglycemic events. No wounds or sores that are not healing well. No increased thirst or urination. Checking glucose at home. Taking medications as prescribed without any side effects.   Hypertension-here to follow-up for blood pressure as well.  It was uncontrolled when I saw him 6 weeks ago.  He was off medication at that time so we did restart his losartan.  Review of Systems     Objective:   Physical Exam  Constitutional: He is oriented to person, place, and time. He appears well-developed and well-nourished.  HENT:  Head: Normocephalic and atraumatic.  Cardiovascular: Normal rate, regular rhythm and normal heart sounds.  Pulmonary/Chest: Effort normal and breath sounds normal.  Neurological: He is alert and oriented to person, place, and time.  Skin: Skin is warm and dry.  Psychiatric: He has a normal mood and affect. His behavior is normal.          Assessment & Plan:  DM -  On ARB and statin.    HTN -

## 2017-02-22 ENCOUNTER — Ambulatory Visit: Payer: Managed Care, Other (non HMO) | Admitting: Family Medicine

## 2017-02-22 DIAGNOSIS — Z0189 Encounter for other specified special examinations: Secondary | ICD-10-CM

## 2017-02-24 ENCOUNTER — Ambulatory Visit: Payer: Managed Care, Other (non HMO) | Admitting: Family Medicine

## 2017-02-25 ENCOUNTER — Ambulatory Visit: Payer: Managed Care, Other (non HMO) | Admitting: Family Medicine

## 2017-02-25 DIAGNOSIS — Z0189 Encounter for other specified special examinations: Secondary | ICD-10-CM

## 2017-03-03 ENCOUNTER — Ambulatory Visit: Payer: Managed Care, Other (non HMO) | Admitting: Family Medicine

## 2017-03-04 ENCOUNTER — Ambulatory Visit: Payer: Managed Care, Other (non HMO) | Admitting: Family Medicine

## 2017-03-07 ENCOUNTER — Ambulatory Visit (INDEPENDENT_AMBULATORY_CARE_PROVIDER_SITE_OTHER): Payer: Managed Care, Other (non HMO) | Admitting: Family Medicine

## 2017-03-07 ENCOUNTER — Encounter: Payer: Self-pay | Admitting: Family Medicine

## 2017-03-07 VITALS — BP 140/78 | HR 73 | Ht 68.0 in | Wt 185.0 lb

## 2017-03-07 DIAGNOSIS — E118 Type 2 diabetes mellitus with unspecified complications: Secondary | ICD-10-CM

## 2017-03-07 DIAGNOSIS — N529 Male erectile dysfunction, unspecified: Secondary | ICD-10-CM | POA: Diagnosis not present

## 2017-03-07 DIAGNOSIS — Z794 Long term (current) use of insulin: Secondary | ICD-10-CM

## 2017-03-07 DIAGNOSIS — I10 Essential (primary) hypertension: Secondary | ICD-10-CM

## 2017-03-07 MED ORDER — SILDENAFIL CITRATE 25 MG PO TABS: 25.0000 mg | ORAL_TABLET | Freq: Every day | ORAL | 99 refills | Status: DC | PRN

## 2017-03-07 MED ORDER — SILDENAFIL CITRATE 20 MG PO TABS: 20.0000 mg | ORAL_TABLET | Freq: Every day | ORAL | 99 refills | Status: DC | PRN

## 2017-03-07 MED ORDER — SILDENAFIL CITRATE 100 MG PO TABS: 50.0000 mg | ORAL_TABLET | Freq: Every day | ORAL | 99 refills | Status: DC | PRN

## 2017-03-07 NOTE — Progress Notes (Signed)
Subjective:    CC: DM, BP check  HPI:  Diabetes - no hypoglycemic events. No wounds or sores that are not healing well. No increased thirst or urination.  He has not been checking his glucose recently.  But says he feels so much better.  He says it is like night and day.  He still a little fatigued but overall feels much better.  Taking medications as prescribed without any side effects.  He has not had his eye exam recently.  Hypertension- Pt denies chest pain, SOB, dizziness, or heart palpitations.  Taking meds as directed w/o problems.  Denies medication side effects.    Like a prescription printed for [facility]ild[facility]il to [facility] since his insurance is not covering this.  Past medical history, Surgical history, Family history not pertinant except as noted below, Social history, Allergies, and medications have been entered into the medical record, reviewed, and corrections made.   Review of Systems: No fevers, chills, night sweats, weight loss, chest pain, or shortness of breath.   Objective:    General: Well Developed, well nourished, and in no acute distress.  Neuro: Alert and oriented x3, extra-ocular muscles intact, sensation grossly intact.  HEENT: Normocephalic, atraumatic  Skin: Warm and dry, no rashes. Cardiac: Regular rate and rhythm, no murmurs rubs or gallops, no lower extremity edema.  Respiratory: Clear to auscultation bilaterally. Not using accessory muscles, speaking in full sentences.   Impression and Recommendations:    HTN - BP borderline will recheck at next OV.    DM- On statin an dARB. He hasn't had an eye exam.  Much improved.  Hemoglobin A1c down to 8.7 which is fantastic.  Making great progress.  I want him to actually check his blood sugar a couple times a week for the next 2 weeks and then call me so that I can adjust his Basaglar.  I discussed that we will need to get his A1c under 7.  ED-we will print new prescription for sodium fill 20 mg tabs so  that he can take this to [facility] for discount pricing.  Encouraged him to schedule wellness exam as well.

## 2017-05-20 ENCOUNTER — Other Ambulatory Visit: Payer: Self-pay | Admitting: Family Medicine

## 2017-05-24 ENCOUNTER — Other Ambulatory Visit: Payer: Self-pay | Admitting: Family Medicine

## 2017-05-27 ENCOUNTER — Other Ambulatory Visit: Payer: Self-pay

## 2017-05-27 DIAGNOSIS — E118 Type 2 diabetes mellitus with unspecified complications: Secondary | ICD-10-CM

## 2017-05-27 DIAGNOSIS — Z794 Long term (current) use of insulin: Principal | ICD-10-CM

## 2017-05-27 MED ORDER — INSULIN PEN NEEDLE 31G X 8 MM MISC: 11 refills | Status: DC

## 2017-05-30 ENCOUNTER — Ambulatory Visit (INDEPENDENT_AMBULATORY_CARE_PROVIDER_SITE_OTHER): Payer: Managed Care, Other (non HMO) | Admitting: Family Medicine

## 2017-05-30 ENCOUNTER — Encounter: Payer: Self-pay | Admitting: Family Medicine

## 2017-05-30 VITALS — BP 135/75 | HR 84 | Ht 68.0 in | Wt 184.0 lb

## 2017-05-30 DIAGNOSIS — Z23 Encounter for immunization: Secondary | ICD-10-CM

## 2017-05-30 DIAGNOSIS — I10 Essential (primary) hypertension: Secondary | ICD-10-CM | POA: Diagnosis not present

## 2017-05-30 DIAGNOSIS — F5101 Primary insomnia: Secondary | ICD-10-CM

## 2017-05-30 DIAGNOSIS — Z794 Long term (current) use of insulin: Secondary | ICD-10-CM | POA: Diagnosis not present

## 2017-05-30 DIAGNOSIS — E118 Type 2 diabetes mellitus with unspecified complications: Secondary | ICD-10-CM

## 2017-05-30 LAB — POCT GLYCOSYLATED HEMOGLOBIN (HGB A1C): HEMOGLOBIN A1C: 7.4

## 2017-05-30 MED ORDER — DAPAGLIFLOZIN PRO-METFORMIN ER 10-1000 MG PO TB24: 1.0000 | ORAL_TABLET | Freq: Every day | ORAL | 1 refills | Status: DC

## 2017-05-30 MED ORDER — BASAGLAR KWIKPEN 100 UNIT/ML ~~LOC~~ SOPN: 36.0000 [IU] | PEN_INJECTOR | Freq: Every day | SUBCUTANEOUS | 0 refills | Status: DC

## 2017-05-30 MED ORDER — TRAZODONE HCL 50 MG PO TABS: 25.0000 mg | ORAL_TABLET | Freq: Every evening | ORAL | 3 refills | Status: DC | PRN

## 2017-05-30 NOTE — Progress Notes (Signed)
Subjective:    CC: DM, BP ck  HPI:  Hypertension- Pt denies chest pain, SOB, dizziness, or heart palpitations.  Taking meds as directed w/o problems.  Denies medication side effects.    Diabetes - no hypoglycemic events. No wounds or sores that are not healing well. No increased thirst or urination. Checking glucose at home. Taking medications as prescribed without any side effects.  He still not sleeping well.  He says he is had chronic insomnia problems since probably his early 2420s.  More recently he did cut back significantly on his caffeine.  He now just drinks a 2 L every couple of days.  Before he was drinking multiple 2 L in a single day.  He says he goes to bed around 9 PM typically falls asleep between 10 and 11 and then wakes up at 3 and cannot fall back asleep.  He in the years he is tried melatonin, Unisom, Tylenol PM, and is is pretty much anything that has been sold over-the-counter specifically for sleep.  Past medical history, Surgical history, Family history not pertinant except as noted below, Social history, Allergies, and medications have been entered into the medical record, reviewed, and corrections made.   Review of Systems: No fevers, chills, night sweats, weight loss, chest pain, or shortness of breath.   Objective:    General: Well Developed, well nourished, and in no acute distress.  Neuro: Alert and oriented x3, extra-ocular muscles intact, sensation grossly intact.  HEENT: Normocephalic, atraumatic  Skin: Warm and dry, no rashes. Cardiac: Regular rate and rhythm, no murmurs rubs or gallops, no lower extremity edema.  Respiratory: Clear to auscultation bilaterally. Not using accessory muscles, speaking in full sentences.   Impression and Recommendations:    HTN - Well controlled. Continue current regimen. Follow up in  3-4 months.    DM -much improved.  Still not quite at goal.  Increase Basaglar to 34 units.  Will write new prescription that includes a 2  unit air shot since he seems to be running a little short each month on his prescription.  On statin and ARB.    Insomnia-trial of tramadol.  Monitor for side effects.  Continue to work on lowering caffeine intake.  Tdap given today.

## 2017-06-03 ENCOUNTER — Ambulatory Visit: Payer: Managed Care, Other (non HMO) | Admitting: Family Medicine

## 2017-06-22 ENCOUNTER — Telehealth: Payer: Self-pay | Admitting: *Deleted

## 2017-06-22 DIAGNOSIS — F5101 Primary insomnia: Secondary | ICD-10-CM

## 2017-06-22 MED ORDER — ZOLPIDEM TARTRATE 5 MG PO TABS: 5.0000 mg | ORAL_TABLET | Freq: Every evening | ORAL | 1 refills | Status: DC | PRN

## 2017-06-22 MED ORDER — TRAZODONE HCL 100 MG PO TABS: 100.0000 mg | ORAL_TABLET | Freq: Every evening | ORAL | 3 refills | Status: DC | PRN

## 2017-06-22 NOTE — Telephone Encounter (Signed)
Ok, new rx sent for Hewlett-[employer]ardambien.

## 2017-06-22 NOTE — Telephone Encounter (Signed)
Called pt to ask how many trazadone he was taking. He said that he was taking 2. He then informed me that he was having "crazy dreams". I asked him if the dreams were bad enough to cause him to stop the med? He said no, not that bad.He asked about trying Ambien I told him that I would need to send this to Dr Linford ArnoldMetheney. He then said that the insurance would not cover the sildenafil and that Dr. Linford ArnoldMetheney would use another code or something to get this approved. Will change trazadone to 100 mg qhs prn. Will forward to pcp for review.[name].Tomas Schamp, Viann Shoveonya Lynetta, CMA

## 2017-06-22 NOTE — Telephone Encounter (Signed)
Pt advised.

## 2017-06-27 ENCOUNTER — Other Ambulatory Visit: Payer: Self-pay | Admitting: *Deleted

## 2017-06-27 DIAGNOSIS — F5101 Primary insomnia: Secondary | ICD-10-CM

## 2017-06-27 MED ORDER — TRAZODONE HCL 100 MG PO TABS: 100.0000 mg | ORAL_TABLET | Freq: Every evening | ORAL | 1 refills | Status: DC | PRN

## 2017-07-07 ENCOUNTER — Telehealth: Payer: Self-pay | Admitting: Family Medicine

## 2017-07-07 NOTE — Telephone Encounter (Signed)
Received fax from [employer] that Sildenafil requires a PA. Information has been sent to the insurance company. Awaiting determination.   

## 2017-07-11 MED ORDER — SILDENAFIL CITRATE 100 MG PO TABS: 50.0000 mg | ORAL_TABLET | Freq: Every day | ORAL | 11 refills | Status: DC | PRN

## 2017-07-11 NOTE — Telephone Encounter (Signed)
Patient is aware that his PCP has sent the Brand Rx to the pharmacy and we will let him know more information about his Viagra.

## 2017-07-11 NOTE — Telephone Encounter (Signed)
Received denial letter that Sildenafil did not meet medical necessity. It looks like the patient can take brand name Sildenafil. Letter placed in your box.  Please advise.

## 2017-07-11 NOTE — Telephone Encounter (Signed)
OK, new rx sent for Brand. Please notify patient. If too costly then he may to want to how much the revatio  is cash.

## 2017-07-12 ENCOUNTER — Other Ambulatory Visit: Payer: Self-pay | Admitting: Family Medicine

## 2017-09-16 ENCOUNTER — Other Ambulatory Visit: Payer: Self-pay | Admitting: Family Medicine

## 2017-09-19 NOTE — Telephone Encounter (Signed)
[facility] requesting RF on Zolpidem.  Last RX sent 06-22-17 for #30 with 1 RF  RX pended. Please send if appropriate

## 2017-10-12 ENCOUNTER — Other Ambulatory Visit: Payer: Self-pay | Admitting: Family Medicine

## 2017-10-15 ENCOUNTER — Other Ambulatory Visit: Payer: Self-pay | Admitting: Family Medicine

## 2017-10-17 ENCOUNTER — Encounter: Payer: Self-pay | Admitting: Family Medicine

## 2017-10-17 ENCOUNTER — Ambulatory Visit (INDEPENDENT_AMBULATORY_CARE_PROVIDER_SITE_OTHER): Payer: BLUE CROSS/BLUE SHIELD | Admitting: Family Medicine

## 2017-10-17 VITALS — BP 164/78 | HR 70 | Ht 68.0 in | Wt 185.0 lb

## 2017-10-17 DIAGNOSIS — Z794 Long term (current) use of insulin: Secondary | ICD-10-CM

## 2017-10-17 DIAGNOSIS — F418 Other specified anxiety disorders: Secondary | ICD-10-CM | POA: Diagnosis not present

## 2017-10-17 DIAGNOSIS — I10 Essential (primary) hypertension: Secondary | ICD-10-CM

## 2017-10-17 DIAGNOSIS — E118 Type 2 diabetes mellitus with unspecified complications: Secondary | ICD-10-CM

## 2017-10-17 LAB — POCT GLYCOSYLATED HEMOGLOBIN (HGB A1C): HEMOGLOBIN A1C: 5.9 % — AB (ref 4.0–5.6)

## 2017-10-17 MED ORDER — FLUOXETINE HCL 10 MG PO CAPS: ORAL_CAPSULE | ORAL | 0 refills | Status: DC

## 2017-10-17 MED ORDER — LOSARTAN POTASSIUM 100 MG PO TABS
50.0000 mg | ORAL_TABLET | Freq: Every day | ORAL | 1 refills | Status: DC
Start: 2017-10-17 — End: 2017-10-19

## 2017-10-17 NOTE — Progress Notes (Signed)
Subjective:    CC: DM and BP  HPI:  Diabetes - no hypoglycemic events. No wounds or sores that are not healing well. No increased thirst or urination. Checking glucose at home. Taking medications as prescribed without any side effects.  F/U mood -warts that has been struggling with irritability.  He says he is noticed at work he is just more irritable and then also at home.  He constantly worries about his ex-wife and how she is doing and constantly worries about his current girlfriend who is had drawn problems on and off and he recently relapsed.  Is feels like for the last year he just has not been able to really get a good handle on his mood and irritability levels.  Hypertension- Pt denies chest pain, SOB, dizziness, or heart palpitations.  Taking meds as directed w/o problems.  Denies medication side effects.    Past medical history, Surgical history, Family history not pertinant except as noted below, Social history, Allergies, and medications have been entered into the medical record, reviewed, and corrections made.   Review of Systems: No fevers, chills, night sweats, weight loss, chest pain, or shortness of breath.   Objective:    General: Well Developed, well nourished, and in no acute distress.  Neuro: Alert and oriented x3, extra-ocular muscles intact, sensation grossly intact.  HEENT: Normocephalic, atraumatic  Skin: Warm and dry, no rashes. Cardiac: Regular rate and rhythm, no murmurs rubs or gallops, no lower extremity edema.  Respiratory: Clear to auscultation bilaterally. Not using accessory muscles, speaking in full sentences.   Impression and Recommendations:    DM -WEll controlled.  Heme globin A1c of 5.9 today which is fantastic.  To new current regimen.  Okay to decrease insulin down to 32 units from 34 units.  Follow-up in 4 months.  HTN -Uncontrolled.  Will increase losartan to 100 mg daily.  New prescription sent to pharmacy.  Recheck in 4  weeks.  Depression/anxiety-PHQ 9 score of 24 and gad 7 score of 19.  Rate symptoms is extremely difficult.  Negative mood questionnaire screening.  We discussed options.  He would like to get in and talk with somebody such as a psychologist I think that would actually be great to really work through some of the things that are going on his son's life and to make some major decisions.  We also discussed possibly getting back on medication.  He was actually on fluoxetine for a little over a year back in 2016 when he was going through separation.  That was helpful at that time and he does not remember any significant night negative side effects.  Will restart medication.  New prescription sent to the pharmacy.  Follow-up in 4 weeks.

## 2017-10-19 ENCOUNTER — Telehealth: Payer: Self-pay

## 2017-10-19 MED ORDER — LOSARTAN POTASSIUM 100 MG PO TABS: 100.0000 mg | ORAL_TABLET | Freq: Every day | ORAL | 1 refills | Status: DC

## 2017-10-19 NOTE — Telephone Encounter (Signed)
Please tell me I am very sorry. New rx sent.

## 2017-10-19 NOTE — Telephone Encounter (Signed)
Larry LongsJoseph called and states the losartan prescription reads take 0.5 tablet of the 100 mg instead of take 1 tablet. Please advise.

## 2017-10-20 NOTE — Telephone Encounter (Signed)
Pt advised.

## 2017-10-28 ENCOUNTER — Telehealth: Payer: Self-pay | Admitting: Family Medicine

## 2017-10-28 DIAGNOSIS — F418 Other specified anxiety disorders: Secondary | ICD-10-CM

## 2017-10-28 NOTE — Telephone Encounter (Signed)
Patient calls and states when seen 2 weeks ago both of you had talked about doing a psychiatry referral.  He states he hasn't heard anything from anyone. I looked under referral tab and there is no referral in place for that. Please advise. KG LPN

## 2017-10-31 NOTE — Telephone Encounter (Signed)
Referral placed.

## 2017-10-31 NOTE — Telephone Encounter (Signed)
Ok to place referral.

## 2017-11-13 ENCOUNTER — Other Ambulatory Visit: Payer: Self-pay | Admitting: Family Medicine

## 2017-11-14 ENCOUNTER — Encounter: Payer: Self-pay | Admitting: Family Medicine

## 2017-11-14 ENCOUNTER — Ambulatory Visit (INDEPENDENT_AMBULATORY_CARE_PROVIDER_SITE_OTHER): Payer: BLUE CROSS/BLUE SHIELD | Admitting: Family Medicine

## 2017-11-14 VITALS — BP 133/80 | HR 96 | Ht 68.0 in | Wt 188.0 lb

## 2017-11-14 DIAGNOSIS — I10 Essential (primary) hypertension: Secondary | ICD-10-CM | POA: Diagnosis not present

## 2017-11-14 DIAGNOSIS — E118 Type 2 diabetes mellitus with unspecified complications: Secondary | ICD-10-CM

## 2017-11-14 DIAGNOSIS — Z794 Long term (current) use of insulin: Secondary | ICD-10-CM

## 2017-11-14 DIAGNOSIS — F418 Other specified anxiety disorders: Secondary | ICD-10-CM

## 2017-11-14 LAB — COMPLETE METABOLIC PANEL WITH GFR
AG Ratio: 1.8 (calc) (ref 1.0–2.5)
ALBUMIN MSPROF: 4 g/dL (ref 3.6–5.1)
ALT: 19 U/L (ref 9–46)
AST: 21 U/L (ref 10–40)
Alkaline phosphatase (APISO): 78 U/L (ref 40–115)
BILIRUBIN TOTAL: 0.3 mg/dL (ref 0.2–1.2)
BUN: 8 mg/dL (ref 7–25)
CHLORIDE: 102 mmol/L (ref 98–110)
CO2: 30 mmol/L (ref 20–32)
CREATININE: 0.99 mg/dL (ref 0.60–1.35)
Calcium: 9.1 mg/dL (ref 8.6–10.3)
GFR, EST AFRICAN AMERICAN: 104 mL/min/{1.73_m2} (ref >=60)
GFR, Est Non African American: 90 mL/min/{1.73_m2} (ref >=60)
GLOBULIN: 2.2 g/dL (ref 1.9–3.7)
GLUCOSE: 168 mg/dL — AB (ref 65–139)
Potassium: 5.1 mmol/L (ref 3.5–5.3)
Sodium: 136 mmol/L (ref 135–146)
Total Protein: 6.2 g/dL (ref 6.1–8.1)

## 2017-11-14 LAB — LIPID PANEL
CHOL/HDL RATIO: 3.4 (calc) (ref <5.0)
CHOLESTEROL: 113 mg/dL (ref <200)
HDL: 33 mg/dL — ABNORMAL LOW (ref >40)
LDL CHOLESTEROL (CALC): 64 mg/dL
Non-HDL Cholesterol (Calc): 80 mg/dL (calc) (ref <130)
TRIGLYCERIDES: 81 mg/dL (ref <150)

## 2017-11-14 MED ORDER — SITAGLIP PHOS-METFORMIN HCL ER 50-1000 MG PO TB24: 1.0000 | ORAL_TABLET | Freq: Every day | ORAL | 5 refills | Status: DC

## 2017-11-14 MED ORDER — FLUOXETINE HCL 20 MG PO CAPS: 20.0000 mg | ORAL_CAPSULE | Freq: Every day | ORAL | 1 refills | Status: DC

## 2017-11-14 NOTE — Progress Notes (Signed)
   Subjective:    Patient ID: Larry Lawson, male    DOB: Nov 30, 1970, 47 y.o.   MRN: [id number]  HPI 3-week follow-up for her mood.  When I last saw him in August he was really struggling with some irritability and increased anxiety and worry.  Felt like the last year he could not get a good handle on his emotions.  His PHQ 9 score was 24 and gad 7 score was 19.  We opted to start him on fluoxetine he had actually been on it before back in 2016 and did not remember any significant side effects.  Also been referred to psychiatry.  Far he is felt good on the medication and he feels like it has been helpful.  Still struggling some with anxiety but his depressive symptoms are much better.  He has not heard from psychiatry.   Diabetes-as his insurance will not pay for Davonna Belling so he like to try going back on Janumet.  Review of Systems     Objective:   Physical Exam  Constitutional: He is oriented to person, place, and time. He appears well-developed and well-nourished.  HENT:  Head: Normocephalic and atraumatic.  Cardiovascular: Normal rate, regular rhythm and normal heart sounds.  Pulmonary/Chest: Effort normal and breath sounds normal.  Neurological: He is alert and oriented to person, place, and time.  Skin: Skin is warm and dry.  Psychiatric: He has a normal mood and affect. His behavior is normal.          Assessment & Plan:   Depression/anxiety-PHQ 9 score of  3,  and gad 7 score of 17.  This options including staying on current regimen for a couple more weeks are going ahead and pushing his dose to 40 mg.  He wants to see him encouragement current regimen and let me know if you weeks how he is doing.  Diabetes - coupon card provided.  Keep regular scheduled follow-up for diabetes.

## 2017-11-17 ENCOUNTER — Other Ambulatory Visit: Payer: Self-pay | Admitting: Family Medicine

## 2017-11-21 ENCOUNTER — Ambulatory Visit (INDEPENDENT_AMBULATORY_CARE_PROVIDER_SITE_OTHER): Payer: BLUE CROSS/BLUE SHIELD | Admitting: Physician Assistant

## 2017-11-21 ENCOUNTER — Encounter: Payer: Self-pay | Admitting: Physician Assistant

## 2017-11-21 ENCOUNTER — Other Ambulatory Visit: Payer: Self-pay | Admitting: *Deleted

## 2017-11-21 ENCOUNTER — Telehealth: Payer: Self-pay | Admitting: Family Medicine

## 2017-11-21 VITALS — BP 144/81 | HR 80 | Temp 98.2°F | Wt 189.0 lb

## 2017-11-21 DIAGNOSIS — L259 Unspecified contact dermatitis, unspecified cause: Secondary | ICD-10-CM | POA: Diagnosis not present

## 2017-11-21 MED ORDER — FLUOXETINE HCL 20 MG PO CAPS: 20.0000 mg | ORAL_CAPSULE | Freq: Every day | ORAL | 0 refills | Status: DC

## 2017-11-21 NOTE — Telephone Encounter (Signed)
Patient states that he was suppose to let Dr.Metheney know IF he needed his Fluoxetine increased and he states he does

## 2017-11-21 NOTE — Progress Notes (Signed)
HPI:                                                                Larry Lawson is a 47 y.o. male who [facility] today for rash  47 yo M with PMH DM2, tobacco use, HTN presents with several days of rash of his left forearm. He noticed several small red pumps that have been spreading, which concerned him. They are occasionally pruritic. He was cleansing the rash today with soap and wash cloth and several of the lesions opened up. Denies fever, warmth, tenderness, purulent drainage. 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 Tdap UTD.   Past Medical History:  Diagnosis Date  . Diabetes mellitus   . Heart murmur   . Heart murmur   . Hypertension    No past surgical history on file. Social History   Tobacco Use  . Smoking status: Light Tobacco Smoker    Packs/day: 0.50  . Smokeless tobacco: Current User  Substance Use Topics  . Alcohol use: Yes   family history includes Cancer in his unknown relative; Diabetes in his brother; Heart attack in his unknown relative; Heart attack (age of onset: 8246) in his brother; Heart disease in his paternal uncle; Heart failure in his brother and mother; Hyperlipidemia in his mother; Hypertension in his father; Prostate cancer in his father.    ROS: negative except as noted in the HPI  Medications: Current Outpatient Medications  Medication Sig Dispense Refill  . AMBULATORY NON FORMULARY MEDICATION Medication Name: Glucometer with strips and lancets to check daily.  Dx: Diabetes 250.00 1 Units 3  . atorvastatin (LIPITOR) 40 MG tablet Take 1 tablet (40 mg total) by mouth daily. 90 tablet 3  . FLUoxetine (PROZAC) 20 MG capsule Take 1 capsule (20 mg total) by mouth daily. 90 capsule 0  . Insulin Glargine (BASAGLAR KWIKPEN) 100 UNIT/ML SOPN INJECT 36 UNITS TOTAL INTO THE SKIN AT BEDTIME. LAST REFILL 15 pen 3  . Insulin Pen Needle (B-D  ULTRAFINE III SHORT PEN) 31G X 8 MM MISC USE  TO ADMINISTER INSULIN, once daily. Dx E11.8 100 each 11  . losartan (COZAAR) 100 MG tablet Take 1 tablet (100 mg total) by mouth daily. 90 tablet 1  . sildenafil (VIAGRA) 100 MG tablet Take 0.5-1 tablets (50-100 mg total) by mouth daily as needed for erectile dysfunction. 5 tablet 11  . SitaGLIPtin-MetFORMIN HCl (JANUMET XR) 50-1000 MG TB24 Take 1 tablet by mouth daily. Pt will bring coupon card 30 tablet 5  . traZODone (DESYREL) 100 MG tablet Take 1 tablet (100 mg total) by mouth at bedtime as needed for sleep. 90 tablet 1  . VENTOLIN HFA 108 (90 BASE) MCG/ACT inhaler INHALE 2 PUFFS INTO THE LUNGS EVERY 6 HOURS AS NEEDED FOR WHEEZING. 18 each 0   No current facility-administered medications for this visit.    Allergies  Allergen Reactions  . Sulfonamide Derivatives     REACTION: pt. can't remember reaction       Objective:  BP (!) 144/81   Pulse 80   Temp 98.2 F (36.8 C) (Oral)   Wt 189 lb (85.7 kg)   BMI 28.74 kg/m  Gen:  alert, not ill-appearing, no distress, appropriate for age HEENT: head normocephalic without obvious abnormality, conjunctiva and cornea clear, trachea midline Pulm: Normal work of breathing, normal phonation Skin: left forearm there is a linear distribution of erythematous papules, several excoriated lesions with skin breakdown to the level of the dermis and surrounding ecchymosis, no bleeding or drainage, no induration or streaking redness   No results found for this or any previous visit (from the past 72 hour(s)). No results found.    Assessment and Plan: 47 y.o. male with   .Diagnoses and all orders for this visit:  Contact dermatitis, unspecified contact dermatitis type, unspecified trigger   - rash has appearance of an allergic contact dermatitis, such as rhus dermatitis - no evidence of secondary infection - Tdap UTD - counseled on general measures and wound care   Patient education and  anticipatory guidance given Patient agrees with treatment plan Follow-up in 4 days for recheck or sooner as needed if symptoms worsen or fail to improve  Levonne Hubert PA-C

## 2017-11-21 NOTE — Patient Instructions (Signed)

## 2017-11-22 NOTE — Telephone Encounter (Signed)
This was increased to 20 mg. And pt was given a 90 day supply.[name], Larry Lawson, CMA Unable to lvm informing him of this, no vm set up.[name].[name], Viann Shoveonya Lawson, CMA

## 2017-11-25 ENCOUNTER — Ambulatory Visit: Payer: BLUE CROSS/BLUE SHIELD | Admitting: Physician Assistant

## 2017-11-28 ENCOUNTER — Other Ambulatory Visit: Payer: Self-pay | Admitting: Family Medicine

## 2017-11-29 ENCOUNTER — Telehealth: Payer: Self-pay

## 2017-11-29 MED ORDER — ZOLPIDEM TARTRATE 5 MG PO TABS: 5.0000 mg | ORAL_TABLET | Freq: Every evening | ORAL | 4 refills | Status: DC | PRN

## 2017-11-29 MED ORDER — FLUOXETINE HCL 40 MG PO CAPS: 40.0000 mg | ORAL_CAPSULE | Freq: Every day | ORAL | 1 refills | Status: DC

## 2017-11-29 NOTE — Telephone Encounter (Signed)
Pt advised.

## 2017-11-29 NOTE — Telephone Encounter (Signed)
Both meds sent. [facility]

## 2017-11-29 NOTE — Telephone Encounter (Signed)
Pt called wanting to have new RX sent for Fluoxetine 40mg .  Per office notes on 11-14-17:  "Depression/anxiety-PHQ 9 score of  3,  and gad 7 score of 17.  This options including staying on current regimen for a couple more weeks are going ahead and pushing his dose to 40 mg.  He wants to see him encouragement current regimen and let me know if you weeks how he is doing."  Please send RX for increased dose if OK.  Also requesting a RF on Ambien. RX pended, please send if appropriate. Was removed from his med list back in July in error.

## 2017-12-10 ENCOUNTER — Other Ambulatory Visit: Payer: Self-pay | Admitting: Family Medicine

## 2017-12-19 ENCOUNTER — Ambulatory Visit: Payer: BLUE CROSS/BLUE SHIELD | Admitting: Family Medicine

## 2017-12-19 ENCOUNTER — Ambulatory Visit (HOSPITAL_COMMUNITY): Payer: BLUE CROSS/BLUE SHIELD | Admitting: Psychiatry

## 2017-12-19 NOTE — Progress Notes (Deleted)
   Subjective:    Patient ID: Larry Lawson, male    DOB: 17-May-1970, 47 y.o.   MRN: [id number]  HPI 47 year old male is here today for follow-up of generalized anxiety disorder-when he was asked here his gad 7 score was 17.  He actually called after the visit and said he was wanting to try an increase on his prescription.  We bump the fluoxetine to 40 mg about 3 weeks ago.   Review of Systems     Objective:   Physical Exam  Constitutional: He is oriented to person, place, and time. He appears well-developed and well-nourished.  HENT:  Head: Normocephalic and atraumatic.  Cardiovascular: Normal rate, regular rhythm and normal heart sounds.  Pulmonary/Chest: Effort normal and breath sounds normal.  Neurological: He is alert and oriented to person, place, and time.  Skin: Skin is warm and dry.  Psychiatric: He has a normal mood and affect. His behavior is normal.          Assessment & Plan:

## 2017-12-25 ENCOUNTER — Other Ambulatory Visit: Payer: Self-pay | Admitting: Family Medicine

## 2017-12-30 DIAGNOSIS — F17200 Nicotine dependence, unspecified, uncomplicated: Secondary | ICD-10-CM | POA: Diagnosis not present

## 2017-12-30 DIAGNOSIS — R05 Cough: Secondary | ICD-10-CM | POA: Diagnosis not present

## 2017-12-30 DIAGNOSIS — J209 Acute bronchitis, unspecified: Secondary | ICD-10-CM | POA: Diagnosis not present

## 2018-01-02 ENCOUNTER — Ambulatory Visit: Payer: BLUE CROSS/BLUE SHIELD | Admitting: Family Medicine

## 2018-01-02 DIAGNOSIS — R05 Cough: Secondary | ICD-10-CM | POA: Diagnosis not present

## 2018-01-02 DIAGNOSIS — J209 Acute bronchitis, unspecified: Secondary | ICD-10-CM | POA: Diagnosis not present

## 2018-01-05 ENCOUNTER — Ambulatory Visit (INDEPENDENT_AMBULATORY_CARE_PROVIDER_SITE_OTHER): Payer: BLUE CROSS/BLUE SHIELD | Admitting: Family Medicine

## 2018-01-05 ENCOUNTER — Encounter: Payer: Self-pay | Admitting: Family Medicine

## 2018-01-05 VITALS — BP 159/91 | HR 70 | Ht 68.0 in | Wt 171.0 lb

## 2018-01-05 DIAGNOSIS — Z794 Long term (current) use of insulin: Secondary | ICD-10-CM | POA: Diagnosis not present

## 2018-01-05 DIAGNOSIS — F418 Other specified anxiety disorders: Secondary | ICD-10-CM | POA: Diagnosis not present

## 2018-01-05 DIAGNOSIS — F5101 Primary insomnia: Secondary | ICD-10-CM | POA: Diagnosis not present

## 2018-01-05 DIAGNOSIS — F419 Anxiety disorder, unspecified: Secondary | ICD-10-CM

## 2018-01-05 DIAGNOSIS — E118 Type 2 diabetes mellitus with unspecified complications: Secondary | ICD-10-CM

## 2018-01-05 DIAGNOSIS — E860 Dehydration: Secondary | ICD-10-CM

## 2018-01-05 LAB — POCT GLYCOSYLATED HEMOGLOBIN (HGB A1C): Hemoglobin A1C: 5.9 % — AB (ref 4.0–5.6)

## 2018-01-05 LAB — GLUCOSE, POCT (MANUAL RESULT ENTRY): POC GLUCOSE: 163 mg/dL — AB (ref 70–99)

## 2018-01-05 MED ORDER — ZOLPIDEM TARTRATE 10 MG PO TABS: 10.0000 mg | ORAL_TABLET | Freq: Every evening | ORAL | 3 refills | Status: DC | PRN

## 2018-01-05 MED ORDER — ALBUTEROL SULFATE HFA 108 (90 BASE) MCG/ACT IN AERS: INHALATION_SPRAY | RESPIRATORY_TRACT | 0 refills | Status: DC

## 2018-01-05 NOTE — Progress Notes (Addendum)
Subjective:    Patient ID: Larry Lawson, male    DOB: 09-15-1970, 47 y.o.   MRN: [id number]  HPI 47 year old male is here today for follow-up depression with anxiety-I last saw him about 6 weeks ago we decided to increase his fluoxetine to 40 mg.  He was to struggling with some of his mood. He is still feeling really anxious about   He is also sick today.  He says that last week he felt extremely tired and went to urgent care.  Was evaluated and diagnosed with bronchitis initially.  He was placed on azithromycin they did a chest x-ray and then he followed up on Monday.  On Monday they did diagnose him with early pneumonia and switched him to [facility] he did complete the azithromycin.  The previous Friday he was given a steroid shot and says since then his blood sugars have been quite elevated.  Orts that his blood sugar was 400 yesterday.  He did eat some powdered donuts for breakfast today.  He still been using his usual 32 units of insulin.  F/U insomnia - he is taking 2 ambien to be able to get a good night's rest.   Review of Systems   BP (!) 159/91   Pulse 70   Ht 5\' 8"  (1.727 m)   Wt 171 lb (77.6 kg)   SpO2 98%   BMI 26.00 kg/m     Allergies  Allergen Reactions  . Sulfonamide Derivatives     REACTION: pt. can't remember reaction    Past Medical History:  Diagnosis Date  . Diabetes mellitus   . Heart murmur   . Heart murmur   . Hypertension     History reviewed. No pertinent surgical history.  Social History   Socioeconomic History  . Marital status: Married    Spouse name: Not on file  . Number of children: Not on file  . Years of education: Not on file  . Highest education level: Not on file  Occupational History  . Not on file  Social Needs  . Financial resource strain: Not on file  . Food insecurity:    Worry: Not on file    Inability: Not on file  . Transportation needs:    Medical: Not on file    Non-medical: Not on file  Tobacco Use  . Smoking  status: Light Tobacco Smoker    Packs/day: 0.50  . Smokeless tobacco: Current User  Substance and Sexual Activity  . Alcohol use: Yes  . Drug use: No  . 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  Lifestyle  . Physical activity:    Days per week: Not on file    Minutes per session: Not on file  . Stress: Not on file  Relationships  . Social connections:    Talks on phone: Not on file    Gets together: Not on file    Attends religious service: Not on file    Active member of club or organization: Not on file    Attends meetings of clubs or organizations: Not on file    Relationship status: Not on file  . Intimate partner violence:    Fear of current or ex partner: Not on file    Emotionally abused: Not on file    Physically abused: Not on file    Forced sexual activity: Not on file  Other Topics Concern  . Not  on file  Social History Narrative  . Not on file    Family History  Problem Relation Age of Onset  . Cancer Unknown        grandmother  . Diabetes Brother   . Hyperlipidemia Mother   . Hypertension Father   . Prostate cancer Father   . Heart attack Unknown        grandfather  . Heart attack Brother 46  . Heart failure Brother   . Heart failure Mother   . Heart disease Paternal Uncle     Outpatient Encounter Medications as of 01/05/2018  Medication Sig  . albuterol (VENTOLIN HFA) 108 (90 Base) MCG/ACT inhaler INHALE 2 PUFFS INTO THE LUNGS EVERY 6 HOURS AS NEEDED FOR WHEEZING.  Marland Kitchen AMBULATORY NON FORMULARY MEDICATION Medication Name: Glucometer with strips and lancets to check daily.  Dx: Diabetes 250.00  . atorvastatin (LIPITOR) 40 MG tablet TAKE 1 TABLET BY MOUTH EVERY DAY  . FLUoxetine (PROZAC) 40 MG capsule Take 1 capsule (40 mg total) by mouth daily.  . Insulin Glargine (BASAGLAR KWIKPEN) 100 UNIT/ML SOPN INJECT 36 UNITS TOTAL INTO THE SKIN AT BEDTIME. LAST REFILL  . Insulin  Pen Needle (B-D ULTRAFINE III SHORT PEN) 31G X 8 MM MISC USE  TO ADMINISTER INSULIN, once daily. Dx E11.8  . losartan (COZAAR) 100 MG tablet Take 1 tablet (100 mg total) by mouth daily.  . sildenafil (VIAGRA) 100 MG tablet Take 0.5-1 tablets (50-100 mg total) by mouth daily as needed for erectile dysfunction.  . SitaGLIPtin-MetFORMIN HCl (JANUMET XR) 50-1000 MG TB24 Take 1 tablet by mouth daily. Pt will bring coupon card  . traZODone (DESYREL) 100 MG tablet Take 1 tablet (100 mg total) by mouth at bedtime as needed for sleep.  Marland Kitchen zolpidem (AMBIEN) 10 MG tablet Take 1 tablet (10 mg total) by mouth at bedtime as needed for sleep.  . [DISCONTINUED] VENTOLIN HFA 108 (90 BASE) MCG/ACT inhaler INHALE 2 PUFFS INTO THE LUNGS EVERY 6 HOURS AS NEEDED FOR WHEEZING.  . [DISCONTINUED] zolpidem (AMBIEN) 5 MG tablet Take 1 tablet (5 mg total) by mouth at bedtime as needed for sleep.   No facility-administered encounter medications on file as of 01/05/2018.        Objective:   Physical Exam  Constitutional: He is oriented to person, place, and time. He appears well-developed and well-nourished.  HENT:  Head: Normocephalic and atraumatic.  Right Ear: External ear normal.  Left Ear: External ear normal.  Nose: Nose normal.  TMs and canals are clear. OP is dry   Eyes: Pupils are equal, round, and reactive to light. Conjunctivae and EOM are normal.  Neck: Neck supple. No thyromegaly present.  Cardiovascular: Normal rate and normal heart sounds.  Pulmonary/Chest: Effort normal.  Inspiratory crackles at bases bilaterally with some slight wheeze.   Lymphadenopathy:    He has no cervical adenopathy.  Neurological: He is alert and oriented to person, place, and time.  Skin: Skin is warm and dry.  Psychiatric: He has a normal mood and affect.      Assessment & Plan:  Depression/anxiety-symptoms still not well controlled with increase of fluoxetine. GAD 7 score of 18 today.   Discussed options.  I like to  refer him to psychiatry for consultation for medication management.  He says he is happy to do so.  Pneumonia -he actually does feel a little bit better today though he still has a productive cough with white sputum.  Encouraged him to complete his Omnicef.  Recommend repeat chest x-ray in 3 to 4 weeks since he is a smoker.  Refill his albuterol inhaler.  Mild dehydration - Encouraged him to really push his fluids.  His weight is down today and I suspect that he is actually mildly hydrated.  DM  -blood glucose yesterday evidently was 400 but today it was normal.  Okay to increase his Basaglar to 38 units and then taper back down to his normal dosing as his blood sugar regulates.  Likely secondary to infection and recent steroid injection.  Insomnia - new rx sent for 10mg  of Ambien since he has been taking 2 of the 5mg  tabs. Warned him not to take more than 10mg .

## 2018-01-05 NOTE — Patient Instructions (Addendum)
Please increase you insulin to 38 units for a couple of day and can gradually go back down to you normal as your sugars go back to normal.   Repeat chest xray in 3 weeks.

## 2018-01-27 ENCOUNTER — Ambulatory Visit (INDEPENDENT_AMBULATORY_CARE_PROVIDER_SITE_OTHER): Payer: BLUE CROSS/BLUE SHIELD | Admitting: Psychiatry

## 2018-01-27 ENCOUNTER — Other Ambulatory Visit: Payer: Self-pay

## 2018-01-27 ENCOUNTER — Encounter (HOSPITAL_COMMUNITY): Payer: Self-pay | Admitting: Psychiatry

## 2018-01-27 ENCOUNTER — Encounter

## 2018-01-27 VITALS — BP 110/78 | HR 84 | Ht 68.0 in | Wt 180.0 lb

## 2018-01-27 DIAGNOSIS — F411 Generalized anxiety disorder: Secondary | ICD-10-CM | POA: Diagnosis not present

## 2018-01-27 DIAGNOSIS — F331 Major depressive disorder, recurrent, moderate: Secondary | ICD-10-CM

## 2018-01-27 DIAGNOSIS — F5102 Adjustment insomnia: Secondary | ICD-10-CM

## 2018-01-27 MED ORDER — GABAPENTIN 100 MG PO CAPS: 100.0000 mg | ORAL_CAPSULE | Freq: Two times a day (BID) | ORAL | 1 refills | Status: DC

## 2018-01-27 NOTE — Patient Instructions (Signed)
Refer to therapy 

## 2018-01-27 NOTE — Progress Notes (Signed)
Psychiatric Initial Adult Assessment   Patient Identification: Larry Lawson MRN:  [id number] Date of Evaluation:  01/27/2018 Referral Source: Dr. Linford Arnold Chief Complaint:   Chief Complaint    Establish Care     Visit Diagnosis:    ICD-10-CM   1. Major depressive disorder, recurrent episode, moderate (HCC) F33.1   2. GAD (generalized anxiety disorder) F41.1   3. Adjustment insomnia F51.02     History of Present Illness: 47 years old currently separated or divorced Caucasian male referred by Dr. Glade Lloyd for management of depression anxiety  Patient has been struggling with his separation that happened in 2013 he has 2 kids now age 5 and age 35 and then transition of spending time with him and the wife. 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  Also had bought sports arena in [place].  Patient states that he had to file bankruptcy and leaving the bankruptcy.  His wife did not supported him.  [place] that has led to separation and it has been hard for him and the kids.  He felt that his depression has started since then.  He has seek counseling and help for depression at that time. Certainly there is recurrence of depression he has been separate from his girlfriend after for 5 years that has been stressful document any started Prozac which was recently increased to 40 mg.  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 He does endorse that his mind races his mind according to him places because of his worries he feels that he has to worry about his jobs where he has to be in his mind is always thinking about completing his task.  He feels his worries is affecting his sleep and he is taking Ambien    Associated Signs/Symptoms: Depression Symptoms:  depressed mood, difficulty concentrating, anxiety, (Hypo) Manic Symptoms:  Distractibility, Anxiety Symptoms:  Excessive Worry, Psychotic Symptoms:  denies PTSD Symptoms: Had a traumatic exposure:  seperation  Past Psychiatric History: depression  Previous Psychotropic Medications: Yes   Substance Abuse History in the last 12 months:  Yes.    Consequences of Substance Abuse: Medical Consequences:  has taken xanax off the streets for a week or two.   Informed its effect on making the meds not workable and tolerance . Can cause depression  Past Medical History:  Past Medical History:  Diagnosis Date  . Diabetes mellitus   . Heart murmur   . Heart murmur   . Hypertension    History reviewed. No pertinent surgical history.  Family Psychiatric History: brother : bipolar  Family History:  Family History  Problem Relation Age of Onset  . Hyperlipidemia Mother   . Heart failure Mother   . Hypertension Father   .  Prostate cancer Father   . Cancer Unknown        grandmother  . Diabetes Brother   . Heart attack Unknown        grandfather  . Heart attack Brother 46  . Heart failure Brother   . Heart disease Paternal Uncle     Social History:   Social History   Socioeconomic History  . Marital status: Married    Spouse name: Not on file  . Number of children: Not on file  . Years of education: Not on file  . Highest education level: Not on file  Occupational History  . Not on file  Social  Needs  . Financial resource strain: Not on file  . Food insecurity:    Worry: Not on file    Inability: Not on file  . Transportation needs:    Medical: Not on file    Non-medical: Not on file  Tobacco Use  . Smoking status: Light Tobacco Smoker    Packs/day: 0.50  . Smokeless tobacco: Current User  Substance and Sexual Activity  . Alcohol use: Yes  . Drug use: No  . 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  Lifestyle  . Physical activity:    Days per week: Not on file    Minutes per session: Not on file  . Stress: Not on file  Relationships  . Social connections:    Talks on phone: Not on file    Gets together: Not on file    Attends religious service: Not on file    Active member of club or organization: Not on file    Attends meetings of clubs or organizations: Not on file    Relationship status: Not on file  Other Topics Concern  . Not on file  Social History Narrative  . Not on file    Additional Social History: grew up with parents. Middle child. No particular trauma Finished GED later.  Married for 12 years now seperated for 5 years  Allergies:   Allergies  Allergen Reactions  . Sulfonamide Derivatives     REACTION: pt. can't remember reaction    Metabolic Disorder Labs: Lab Results  Component Value Date   HGBA1C 5.9 (A) 01/05/2018   No results found for: PROLACTIN Lab Results  Component Value Date   CHOL 113 11/14/2017   TRIG 81 11/14/2017   HDL 33 (L) 11/14/2017   CHOLHDL 3.4 11/14/2017   VLDL 25 04/09/2015   LDLCALC 64 11/14/2017   LDLCALC 134 (H) 04/09/2015   Lab Results  Component Value Date   TSH 1.90 01/05/2017    Therapeutic Level Labs: No results found for: LITHIUM No results found for: CBMZ No results found for: VALPROATE  Current Medications: Current Outpatient Medications  Medication Sig Dispense Refill  . albuterol (VENTOLIN HFA) 108 (90  Base) MCG/ACT inhaler INHALE 2 PUFFS INTO THE LUNGS EVERY 6 HOURS AS NEEDED FOR WHEEZING. 18 each 0  . AMBULATORY NON FORMULARY MEDICATION Medication Name: Glucometer with strips and lancets to check daily.  Dx: Diabetes 250.00 1 Units 3  . atorvastatin (LIPITOR) 40 MG tablet TAKE 1 TABLET BY MOUTH EVERY DAY 90 tablet 3  . FLUoxetine (PROZAC) 40 MG capsule Take 1 capsule (40 mg total) by mouth daily. 90 capsule 1  . Insulin Glargine (BASAGLAR KWIKPEN) 100 UNIT/ML SOPN INJECT 36 UNITS TOTAL INTO THE SKIN AT BEDTIME. LAST REFILL 15 pen 3  .  Insulin Pen Needle (B-D ULTRAFINE III SHORT PEN) 31G X 8 MM MISC USE  TO ADMINISTER INSULIN, once daily. Dx E11.8 100 each 11  . losartan (COZAAR) 100 MG tablet Take 1 tablet (100 mg total) by mouth daily. 90 tablet 1  . sildenafil (VIAGRA) 100 MG tablet Take 0.5-1 tablets (50-100 mg total) by mouth daily as needed for erectile dysfunction. 5 tablet 11  . SitaGLIPtin-MetFORMIN HCl (JANUMET XR) 50-1000 MG TB24 Take 1 tablet by mouth daily. Pt will bring coupon card 30 tablet 5  . zolpidem (AMBIEN) 10 MG tablet Take 1 tablet (10 mg total) by mouth at bedtime as needed for sleep. 30 tablet 3  . gabapentin (NEURONTIN) 100 MG capsule Take 1 capsule (100 mg total) by mouth 2 (two) times daily. 60 capsule 1   No current facility-administered medications for this visit.     Musculoskeletal: Strength & Muscle Tone: within normal limits Gait & Station: normal Patient leans: no lean  Psychiatric Specialty Exam: Review of Systems  Cardiovascular: Negative for chest pain.  Skin: Negative for rash.  Neurological: Negative for tremors.  Psychiatric/Behavioral: Positive for depression. Negative for suicidal ideas. The patient is nervous/anxious.     Blood pressure 110/78, pulse 84, height 5\' 8"  (1.727 m), weight 180 lb (81.6 kg).Body mass index is 27.37 kg/m.  General Appearance: Casual  Eye Contact:  Fair  Speech:  Slow  Volume:  Normal  Mood:  subdued  Affect:   Constricted  Thought Process:  Goal Directed  Orientation:  Full (Time, Place, and Person)  Thought Content:  Rumination  Suicidal Thoughts:  No  Homicidal Thoughts:  No  Memory:  Immediate;   Fair Recent;   Fair  Judgement:  Fair  Insight:  Shallow  Psychomotor Activity:  Normal  Concentration:  Concentration: Fair and Attention Span: Fair  Recall:  FiservFair  Fund of Knowledge:Fair  Language: Fair  Akathisia:  No  Handed:  Right  AIMS (if indicated):  not done  Assets:  Desire for Improvement  ADL's:  Intact  Cognition: WNL  Sleep:  Fair on meds   Screenings: GAD-7     Office Visit from 01/05/2018 in [facility] [place] Office Visit from 11/14/2017 in [facility] [place] Office Visit from 10/17/2017 in [facility] [place]  Total GAD-7 Score  20  17  19     PHQ2-9     Office Visit from 01/05/2018 in [facility] [place] Office Visit from 11/14/2017 in [facility] [place] Office Visit from 10/17/2017 in [facility] [place]  PHQ-2 Total Score  0  0  6  PHQ-9 Total Score  3  3  24       Assessment and Plan: as follows  MDD , moderate; remains subdued, continue prozac will add neurontin for augmentation, considering has anxiety and racing toughts. 100mg  bid. 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 If dose need increased will communicate with primary care Will refer to therapy to work on coping skills and to deal with the seperation that he endorses still effect him  GAD: add neurontin as above, continue prozac. Avoid any substance use  Insomnia: reviewed sleep hygiene. Go to bed late and avoid bedroom during the day   More than 50% time spent in counseling and coordination of care including patient education reviewed side effects and concerns were addressed Donnah Levert  Gilmore Laroche, MD 11/22/201911:44 AM

## 2018-02-11 DIAGNOSIS — S81802A Unspecified open wound, left lower leg, initial encounter: Secondary | ICD-10-CM | POA: Diagnosis not present

## 2018-02-13 ENCOUNTER — Telehealth: Payer: Self-pay | Admitting: *Deleted

## 2018-02-13 NOTE — Telephone Encounter (Signed)
Pt reports that he was seen at an [place] in [place] [address] for a laceration to his leg. He was given ABX and pain medication. He was told to f/u with pcp for the infection to his leg. I informed pt that Dr. Linford ArnoldMetheney has an opening tomorrow at 930 AM. Will schedule him for that time until I can speak w/pcp to see if he can be seen at end of clinic today.[name].Kela Baccari, Viann Shoveonya Lynetta, CMA

## 2018-02-13 NOTE — Telephone Encounter (Signed)
Spoke w/Dr. Linford ArnoldMetheney and she stated that pt is fine in the 930 acute spot.[name], Trellis Guirguis Lynetta, CMA

## 2018-02-14 ENCOUNTER — Ambulatory Visit (INDEPENDENT_AMBULATORY_CARE_PROVIDER_SITE_OTHER): Payer: BLUE CROSS/BLUE SHIELD | Admitting: Family Medicine

## 2018-02-14 ENCOUNTER — Encounter: Payer: Self-pay | Admitting: Family Medicine

## 2018-02-14 VITALS — BP 138/84 | HR 72 | Temp 98.6°F | Ht 68.0 in | Wt 181.0 lb

## 2018-02-14 DIAGNOSIS — S81811A Laceration without foreign body, right lower leg, initial encounter: Secondary | ICD-10-CM

## 2018-02-14 DIAGNOSIS — F5101 Primary insomnia: Secondary | ICD-10-CM | POA: Diagnosis not present

## 2018-02-14 MED ORDER — BUSPIRONE HCL 5 MG PO TABS: 5.0000 mg | ORAL_TABLET | Freq: Three times a day (TID) | ORAL | 0 refills | Status: DC

## 2018-02-14 NOTE — Progress Notes (Signed)
Subjective:    CC: Follow-up laceration to right lower leg./Medication side effect.  HPI:  A ladder came down and hit his left shin, last Thursday. Caused a large laceration and went to the ED.  He thinks the neurontin makes him groggy thinks that may have contributed to the injury.  The urgent care put ster-strips on it as they felt like it had already been too many hours to attempt to close the wound with sutures.Marland Kitchen.  He felt like he ran a fever yesterday.  It is painful. He got rid of the gabapentin.  They started him on doxy.  Given 6 tabs of hydrocodone.  He says the wound is still painful.  He has had a little bit of drainage from it.  No swelling.   BP (!) 141/88   Pulse 72   Temp 98.6 F (37 C)   Ht 5\' 8"  (1.727 m)   Wt 181 lb (82.1 kg)   SpO2 99%   BMI 27.52 kg/m     Allergies  Allergen Reactions  . Gabapentin Other (See Comments)    Drowsiness   . Sulfonamide Derivatives     REACTION: pt. can't remember reaction    Past Medical History:  Diagnosis Date  . Diabetes mellitus   . Heart murmur   . Heart murmur   . Hypertension     No past surgical history on file.  Social History   Socioeconomic History  . Marital status: Married    Spouse name: Not on file  . Number of children: Not on file  . Years of education: Not on file  . Highest education level: Not on file  Occupational History  . Not on file  Social Needs  . Financial resource strain: Not on file  . Food insecurity:    Worry: Not on file    Inability: Not on file  . Transportation needs:    Medical: Not on file    Non-medical: Not on file  Tobacco Use  . Smoking status: Light Tobacco Smoker    Packs/day: 0.50  . Smokeless tobacco: Current User  Substance and Sexual Activity  . Alcohol use: Yes  . Drug use: No  . Sexual activity: Not on file    Comment: Sports [employer] @ [employer], Masters degree, married, no regular exercise.  Lifestyle  . Physical activity:    Days per week: Not  on file    Minutes per session: Not on file  . Stress: Not on file  Relationships  . Social connections:    Talks on phone: Not on file    Gets together: Not on file    Attends religious service: Not on file    Active member of club or organization: Not on file    Attends meetings of clubs or organizations: Not on file    Relationship status: Not on file  . Intimate partner violence:    Fear of current or ex partner: Not on file    Emotionally abused: Not on file    Physically abused: Not on file    Forced sexual activity: Not on file  Other Topics Concern  . Not on file  Social History Narrative  . Not on file    Family History  Problem Relation Age of Onset  . Hyperlipidemia Mother   . Heart failure Mother   . Hypertension Father   . Prostate cancer Father   . Cancer Unknown        grandmother  . Diabetes  Brother   . Heart attack Unknown        grandfather  . Heart attack Brother 46  . Heart failure Brother   . Heart disease Paternal Uncle     Outpatient Encounter Medications as of 02/14/2018  Medication Sig  . albuterol (VENTOLIN HFA) 108 (90 Base) MCG/ACT inhaler INHALE 2 PUFFS INTO THE LUNGS EVERY 6 HOURS AS NEEDED FOR WHEEZING.  Marland Kitchen AMBULATORY NON FORMULARY MEDICATION Medication Name: Glucometer with strips and lancets to check daily.  Dx: Diabetes 250.00  . atorvastatin (LIPITOR) 40 MG tablet TAKE 1 TABLET BY MOUTH EVERY DAY  . FLUoxetine (PROZAC) 40 MG capsule Take 1 capsule (40 mg total) by mouth daily.  . Insulin Glargine (BASAGLAR KWIKPEN) 100 UNIT/ML SOPN INJECT 36 UNITS TOTAL INTO THE SKIN AT BEDTIME. LAST REFILL  . Insulin Pen Needle (B-D ULTRAFINE III SHORT PEN) 31G X 8 MM MISC USE  TO ADMINISTER INSULIN, once daily. Dx E11.8  . losartan (COZAAR) 100 MG tablet Take 1 tablet (100 mg total) by mouth daily.  . sildenafil (VIAGRA) 100 MG tablet Take 0.5-1 tablets (50-100 mg total) by mouth daily as needed for erectile dysfunction.  . SitaGLIPtin-MetFORMIN HCl  (JANUMET XR) 50-1000 MG TB24 Take 1 tablet by mouth daily. Pt will bring coupon card  . zolpidem (AMBIEN) 10 MG tablet Take 1 tablet (10 mg total) by mouth at bedtime as needed for sleep.  . busPIRone (BUSPAR) 5 MG tablet Take 1 tablet (5 mg total) by mouth 3 (three) times daily.  . [DISCONTINUED] gabapentin (NEURONTIN) 100 MG capsule Take 1 capsule (100 mg total) by mouth 2 (two) times daily.  . [DISCONTINUED] traZODone (DESYREL) 100 MG tablet Take 1 tablet (100 mg total) by mouth at bedtime as needed for sleep.   No facility-administered encounter medications on file as of 02/14/2018.         Objective:    General: Well Developed, well nourished, and in no acute distress.  Neuro: Alert and oriented x3, extra-ocular muscles intact, sensation grossly intact.  HEENT: Normocephalic, atraumatic  Skin: Warm and dry, no rashes. Cardiac: Regular rate and rhythm, no murmurs rubs or gallops, no lower extremity edema.  Respiratory: Clear to auscultation bilaterally. Not using accessory muscles, speaking in full sentences.   Impression and Recommendations:   Laceration to left lower leg.  We discussed wound care.  Has 5 days of doxycycline. Make sure to complete your doxycycline.  Clean daily with antibacterial soap.  Do not scrub at the area just use fingertips and then pat dry.  Apply a small dab of Vaseline over just the incision.  Then cover with gauze if you are going to be working.   Anxiety encouraged him to follow-up with psychiatry.  I did note the side effect of the gabapentin and added it to his intolerance list.  In the meantime we will try the addition of BuSpar to his fluoxetine.  Medication side effect-intolerance added to allergy list.

## 2018-02-14 NOTE — Patient Instructions (Addendum)
Make sure to complete your doxycycline.  Clean daily with antibacterial soap.  Do not scrub at the area just use fingertips and then pat dry.  Apply a small dab of Vaseline over just the incision.  Then cover with gauze if you are going to be working.   Schedule a follow-up with psychiatry since you are not able to tolerate the gabapentin.  We will try BuSpar in the meantime until you are able to get in with them.

## 2018-02-22 ENCOUNTER — Ambulatory Visit (HOSPITAL_COMMUNITY): Payer: BLUE CROSS/BLUE SHIELD | Admitting: Psychiatry

## 2018-03-03 ENCOUNTER — Telehealth: Payer: Self-pay | Admitting: *Deleted

## 2018-03-03 MED ORDER — BUSPIRONE HCL 10 MG PO TABS: 10.0000 mg | ORAL_TABLET | Freq: Three times a day (TID) | ORAL | 1 refills | Status: DC

## 2018-03-03 NOTE — Telephone Encounter (Signed)
Pt called and stated that over the past wk it hurts to touch his skin. He has been taking IBU, tylenol,mucinex since Monday. He stated that he had Pneumonia a few weeks ago. He said he doesn't feel "normal". His upper torso feels funny like needles. I asked if he took his temperature he stated that he did not but he had sweats x 2 days off and on. I asked about any other symptoms. He stated that his BM's have been solid and he has been constipated. He just finished up his ABX on last Friday.   Pt reports that this morning he took a 100 mg Gabapentin since he didn't have to work. I asked if he was still taking the Buspar and Fluoxetine he said that he was. I also asked if he has made a f/u appt with Dr. Gilmore LarocheAkhtar he said that he has an appt w/someone in that office next month. He then said that maybe he should go ahead and take the other 100 mg of Gabapentin since that is what he is prescribed and this would make him "feel normal". I advised him not to take the second dose until he has heard back from Dr. Linford ArnoldMetheney about what he should do. Pt voiced understanding and agreed.[name], Brandilyn Nanninga Lynetta, CMA

## 2018-03-03 NOTE — Telephone Encounter (Signed)
Ok, we can always get a chest xray if he is still having a cough or fever.  I also increased his Buspar to 10mg  TID.

## 2018-03-05 DIAGNOSIS — Z882 Allergy status to sulfonamides status: Secondary | ICD-10-CM | POA: Diagnosis not present

## 2018-03-05 DIAGNOSIS — R918 Other nonspecific abnormal finding of lung field: Secondary | ICD-10-CM | POA: Diagnosis not present

## 2018-03-05 DIAGNOSIS — Z794 Long term (current) use of insulin: Secondary | ICD-10-CM | POA: Diagnosis not present

## 2018-03-05 DIAGNOSIS — N179 Acute kidney failure, unspecified: Secondary | ICD-10-CM | POA: Diagnosis not present

## 2018-03-05 DIAGNOSIS — R509 Fever, unspecified: Secondary | ICD-10-CM | POA: Diagnosis not present

## 2018-03-05 DIAGNOSIS — F17220 Nicotine dependence, chewing tobacco, uncomplicated: Secondary | ICD-10-CM | POA: Diagnosis not present

## 2018-03-05 DIAGNOSIS — J189 Pneumonia, unspecified organism: Secondary | ICD-10-CM | POA: Diagnosis not present

## 2018-03-05 DIAGNOSIS — J9601 Acute respiratory failure with hypoxia: Secondary | ICD-10-CM | POA: Diagnosis not present

## 2018-03-05 DIAGNOSIS — R1011 Right upper quadrant pain: Secondary | ICD-10-CM | POA: Diagnosis not present

## 2018-03-05 DIAGNOSIS — R0602 Shortness of breath: Secondary | ICD-10-CM | POA: Diagnosis not present

## 2018-03-05 DIAGNOSIS — I10 Essential (primary) hypertension: Secondary | ICD-10-CM | POA: Diagnosis not present

## 2018-03-05 DIAGNOSIS — E119 Type 2 diabetes mellitus without complications: Secondary | ICD-10-CM | POA: Diagnosis not present

## 2018-03-05 DIAGNOSIS — J13 Pneumonia due to Streptococcus pneumoniae: Secondary | ICD-10-CM | POA: Diagnosis not present

## 2018-03-05 DIAGNOSIS — R05 Cough: Secondary | ICD-10-CM | POA: Diagnosis not present

## 2018-03-05 DIAGNOSIS — S81812A Laceration without foreign body, left lower leg, initial encounter: Secondary | ICD-10-CM | POA: Diagnosis not present

## 2018-03-05 DIAGNOSIS — F329 Major depressive disorder, single episode, unspecified: Secondary | ICD-10-CM | POA: Diagnosis not present

## 2018-03-05 DIAGNOSIS — R1110 Vomiting, unspecified: Secondary | ICD-10-CM | POA: Diagnosis not present

## 2018-03-05 DIAGNOSIS — R1084 Generalized abdominal pain: Secondary | ICD-10-CM | POA: Diagnosis not present

## 2018-03-05 DIAGNOSIS — Z79899 Other long term (current) drug therapy: Secondary | ICD-10-CM | POA: Diagnosis not present

## 2018-03-06 DIAGNOSIS — E119 Type 2 diabetes mellitus without complications: Secondary | ICD-10-CM | POA: Diagnosis not present

## 2018-03-06 DIAGNOSIS — J9601 Acute respiratory failure with hypoxia: Secondary | ICD-10-CM | POA: Diagnosis not present

## 2018-03-06 DIAGNOSIS — I10 Essential (primary) hypertension: Secondary | ICD-10-CM | POA: Diagnosis not present

## 2018-03-06 DIAGNOSIS — J189 Pneumonia, unspecified organism: Secondary | ICD-10-CM | POA: Diagnosis not present

## 2018-03-06 NOTE — Telephone Encounter (Signed)
Called pt and he informed me that [facility]. He went in on Sunday. I advised him to be sure to f/u with Dr. Linford ArnoldMetheney once he is released.[name], Aristides Luckey Lynetta, CMA

## 2018-03-07 DIAGNOSIS — E119 Type 2 diabetes mellitus without complications: Secondary | ICD-10-CM | POA: Diagnosis not present

## 2018-03-07 DIAGNOSIS — I10 Essential (primary) hypertension: Secondary | ICD-10-CM | POA: Diagnosis not present

## 2018-03-07 DIAGNOSIS — J9601 Acute respiratory failure with hypoxia: Secondary | ICD-10-CM | POA: Diagnosis not present

## 2018-03-07 DIAGNOSIS — J189 Pneumonia, unspecified organism: Secondary | ICD-10-CM | POA: Diagnosis not present

## 2018-03-07 MED ORDER — FLUOXETINE HCL 20 MG PO CAPS
40.00 | ORAL_CAPSULE | ORAL | Status: DC
Start: 2018-03-09 — End: 2018-03-07

## 2018-03-07 MED ORDER — INSULIN LISPRO 100 UNIT/ML ~~LOC~~ SOLN
1.00 | SUBCUTANEOUS | Status: DC
Start: 2018-03-08 — End: 2018-03-07

## 2018-03-07 MED ORDER — POLYETHYLENE GLYCOL 3350 17 G PO PACK
17.00 | PACK | ORAL | Status: DC
Start: ? — End: 2018-03-07

## 2018-03-07 MED ORDER — SODIUM CHLORIDE 0.9 % IV SOLN
10.00 | INTRAVENOUS | Status: DC
Start: ? — End: 2018-03-07

## 2018-03-07 MED ORDER — GENERIC EXTERNAL MEDICATION
1.00 | Status: DC
Start: ? — End: 2018-03-07

## 2018-03-07 MED ORDER — ATORVASTATIN CALCIUM 40 MG PO TABS
40.00 | ORAL_TABLET | ORAL | Status: DC
Start: 2018-03-08 — End: 2018-03-07

## 2018-03-07 MED ORDER — ENOXAPARIN SODIUM 40 MG/0.4ML ~~LOC~~ SOLN
40.00 | SUBCUTANEOUS | Status: DC
Start: 2018-03-08 — End: 2018-03-07

## 2018-03-07 MED ORDER — GENERIC EXTERNAL MEDICATION
2.00 | Status: DC
Start: 2018-03-08 — End: 2018-03-07

## 2018-03-07 MED ORDER — CLONIDINE HCL 0.1 MG PO TABS
0.10 | ORAL_TABLET | ORAL | Status: DC
Start: ? — End: 2018-03-07

## 2018-03-07 MED ORDER — ALBUTEROL SULFATE (2.5 MG/3ML) 0.083% IN NEBU
2.50 | INHALATION_SOLUTION | RESPIRATORY_TRACT | Status: DC
Start: 2018-03-08 — End: 2018-03-07

## 2018-03-07 MED ORDER — BUSPIRONE HCL 5 MG PO TABS
10.00 | ORAL_TABLET | ORAL | Status: DC
Start: 2018-03-09 — End: 2018-03-07

## 2018-03-07 MED ORDER — INSULIN GLARGINE 100 UNIT/ML ~~LOC~~ SOLN
1.00 | SUBCUTANEOUS | Status: DC
Start: ? — End: 2018-03-07

## 2018-03-07 MED ORDER — INSULIN LISPRO 100 UNIT/ML ~~LOC~~ SOLN
1.00 | SUBCUTANEOUS | Status: DC
Start: ? — End: 2018-03-07

## 2018-03-07 MED ORDER — GENERIC EXTERNAL MEDICATION
10.00 | Status: DC
Start: ? — End: 2018-03-07

## 2018-03-07 MED ORDER — GENERIC EXTERNAL MEDICATION
650.00 | Status: DC
Start: ? — End: 2018-03-07

## 2018-03-07 MED ORDER — HYDROCODONE-ACETAMINOPHEN 5-325 MG PO TABS
1.00 | ORAL_TABLET | ORAL | Status: DC
Start: ? — End: 2018-03-07

## 2018-03-07 MED ORDER — GENERIC EXTERNAL MEDICATION
4.00 | Status: DC
Start: ? — End: 2018-03-07

## 2018-03-07 MED ORDER — NITROGLYCERIN 0.4 MG SL SUBL
0.40 | SUBLINGUAL_TABLET | SUBLINGUAL | Status: DC
Start: ? — End: 2018-03-07

## 2018-03-07 MED ORDER — ALBUTEROL SULFATE HFA 108 (90 BASE) MCG/ACT IN AERS
2.00 | INHALATION_SPRAY | RESPIRATORY_TRACT | Status: DC
Start: ? — End: 2018-03-07

## 2018-03-07 MED ORDER — INSULIN GLARGINE 100 UNIT/ML ~~LOC~~ SOLN
1.00 | SUBCUTANEOUS | Status: DC
Start: 2018-03-08 — End: 2018-03-07

## 2018-03-07 MED ORDER — ACETAMINOPHEN 325 MG PO TABS
650.00 | ORAL_TABLET | ORAL | Status: DC
Start: ? — End: 2018-03-07

## 2018-03-07 MED ORDER — HYDROCODONE-ACETAMINOPHEN 10-325 MG PO TABS
1.00 | ORAL_TABLET | ORAL | Status: DC
Start: ? — End: 2018-03-07

## 2018-03-07 MED ORDER — AZITHROMYCIN 500 MG PO TABS
500.00 | ORAL_TABLET | ORAL | Status: DC
Start: 2018-03-09 — End: 2018-03-07

## 2018-03-08 DIAGNOSIS — J9601 Acute respiratory failure with hypoxia: Secondary | ICD-10-CM | POA: Diagnosis not present

## 2018-03-08 DIAGNOSIS — I10 Essential (primary) hypertension: Secondary | ICD-10-CM | POA: Diagnosis not present

## 2018-03-08 DIAGNOSIS — J189 Pneumonia, unspecified organism: Secondary | ICD-10-CM | POA: Diagnosis not present

## 2018-03-08 DIAGNOSIS — E119 Type 2 diabetes mellitus without complications: Secondary | ICD-10-CM | POA: Diagnosis not present

## 2018-03-10 ENCOUNTER — Ambulatory Visit (INDEPENDENT_AMBULATORY_CARE_PROVIDER_SITE_OTHER): Payer: BLUE CROSS/BLUE SHIELD | Admitting: Family Medicine

## 2018-03-10 ENCOUNTER — Encounter: Payer: Self-pay | Admitting: Family Medicine

## 2018-03-10 VITALS — BP 143/66 | HR 56 | Ht 68.0 in | Wt 184.0 lb

## 2018-03-10 DIAGNOSIS — J189 Pneumonia, unspecified organism: Secondary | ICD-10-CM | POA: Diagnosis not present

## 2018-03-10 DIAGNOSIS — I10 Essential (primary) hypertension: Secondary | ICD-10-CM | POA: Diagnosis not present

## 2018-03-10 MED ORDER — BENZONATATE 200 MG PO CAPS: 200.0000 mg | ORAL_CAPSULE | Freq: Three times a day (TID) | ORAL | 0 refills | Status: DC | PRN

## 2018-03-10 MED ORDER — LOSARTAN POTASSIUM 100 MG PO TABS
100.00 | ORAL_TABLET | ORAL | Status: DC
Start: 2018-03-09 — End: 2018-03-10

## 2018-03-10 MED ORDER — LEVOFLOXACIN 750 MG PO TABS: 750.0000 mg | ORAL_TABLET | Freq: Every day | ORAL | 0 refills | Status: DC

## 2018-03-10 NOTE — Patient Instructions (Signed)
Go for xray of chest in about 2 weeks.

## 2018-03-10 NOTE — Progress Notes (Signed)
Established Patient Office Visit-hospital follow-up  Subjective:  Patient ID: Larry SafeJoseph M Dimmer, male    DOB: 1970-08-21  Age: 48 y.o. MRN: [id number]  CC:  Chief Complaint  Patient presents with  . Hospitalization Follow-up    pt reports that he is doing better, he still has a cough and would like a perscription for tessalon pearls. he stated that he was given cefdinir at discharge     HPI Larry Lawson presents for hospital follow-up.  He was admitted to [facility] for acute respiratory failure secondary to pneumonia.  He was admitted on December 29 and discharged home 3 days later on Jan 1.  Treated with IV antibiotics but then discharged home with Omnicef.  [facility] before he was actually admitted to the hospital so he is been worried that is not really treating the infection.  Taken from [facility] D/C Summary: "Hospital Course:  Patient admitted for acute hypoxemic respiratory failure 2/2 pneumococcal PNA, R>L, bands on differential and febrile at presentation. patient was treated with 3d of IV azithro/rocephin prior to losing PIV access and declining further attempts at replacing. Fevers resolved after admission, he is no longer requiring oxygen and states that he feels much better and would like to return home. There is some confusion regarding possible recent treatment failure as he reports that he was seen at an urgent care in [place] and treated for PNA with a "purple pill" but does not remember the name. He initially stated that this occurred in October but later stated November. We discussed PCP visit for leg lac where he was noted to be treated with doxy and he states that this was separate. Upon further questioning he reported that he did not feel that the prior sx he experienced with that PNA ever resolved and as a result there was some concern for prior tx failure and need for a longer course of IV abx prior to  discharge. However, at this point he is wanting to return home and he volunteered to call [facility] and ask for names of recent abx and he now reports that he has only received doxy in the last 6 months. At any rate, he has improved in every way. His respiratory biofire and MRSA PCR were negative but urine pneumococcal Ag was positive. Sputum cx with moderate growth of nl flora. BCx2 have remained negative. Given marked clinical improvement with rocephin, will [facility]. His LLE laceration is healing well. His Cr did improve with IVF resuscitation and decreased to 0.6 on the day of discharge. ARB had initially been held for AKI and his BP was noted to increase in the 24hrs prior to discharge. ARB resumed. He had initially c/o coughing up coffee ground material but this did not recur. BS remained stable. He will f/u with PCP for continued care. "  He is overall doing better than he was but still really tired and weak.  He would like rx for tessalon perles. No longer coughing up coffee grounds.  No more fever.    Hypertension- Pt denies chest pain, SOB, dizziness, or heart palpitations.  Taking meds as directed w/o problems.  Denies medication side effects.      Past Medical History:  Diagnosis Date  . Diabetes mellitus   . Heart murmur   . Heart murmur   . Hypertension     No past surgical history on file.  Family History  Problem Relation Age of Onset  . Hyperlipidemia Mother   . Heart failure Mother   . Hypertension Father   . Prostate cancer Father   . Cancer Unknown        grandmother  . Diabetes Brother   . Heart attack Unknown        grandfather  . Heart attack Brother 46  . Heart failure Brother   . Heart disease Paternal Uncle     Social History   Socioeconomic History  . Marital status: Married    Spouse name: Not on file  . Number of children: Not on file  . Years of education: Not on file  . Highest education level: Not on file   Occupational History  . Not on file  Social Needs  . Financial resource strain: Not on file  . Food insecurity:    Worry: Not on file    Inability: Not on file  . Transportation needs:    Medical: Not on file    Non-medical: Not on file  Tobacco Use  . Smoking status: Light Tobacco Smoker    Packs/day: 0.50  . Smokeless tobacco: Current User  Substance and Sexual Activity  . Alcohol use: Yes  . Drug use: No  . 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  Lifestyle  . Physical activity:    Days per week: Not on file    Minutes per session: Not on file  . Stress: Not on file  Relationships  . Social connections:    Talks on phone: Not on file    Gets together: Not on file    Attends religious service: Not on file    Active member of club or organization: Not on file    Attends meetings of clubs or organizations: Not on file    Relationship status: Not on file  . Intimate partner violence:    Fear of current or ex partner: Not on file    Emotionally abused: Not on file    Physically abused: Not on file    Forced sexual activity: Not on file  Other Topics Concern  . Not on file  Social History Narrative  . Not on file    Outpatient Medications Prior to Visit  Medication Sig Dispense Refill  . albuterol (VENTOLIN HFA) 108 (90 Base) MCG/ACT inhaler INHALE 2 PUFFS INTO THE LUNGS EVERY 6 HOURS AS NEEDED FOR WHEEZING. 18 each 0  . AMBULATORY NON FORMULARY MEDICATION Medication Name: Glucometer with strips and lancets to check daily.  Dx: Diabetes 250.00 1 Units 3  . atorvastatin (LIPITOR) 40 MG tablet TAKE 1 TABLET BY MOUTH EVERY DAY 90 tablet 3  . busPIRone (BUSPAR) 10 MG tablet Take 1 tablet (10 mg total) by mouth 3 (three) times daily. 90 tablet 1  . FLUoxetine (PROZAC) 40 MG capsule Take 1 capsule (40 mg total) by mouth daily. 90 capsule 1  . Insulin Glargine (BASAGLAR KWIKPEN) 100 UNIT/ML  SOPN INJECT 36 UNITS TOTAL INTO THE SKIN AT BEDTIME. LAST REFILL 15 pen 3  . Insulin Pen Needle (B-D ULTRAFINE III SHORT PEN) 31G X 8 MM MISC USE  TO ADMINISTER INSULIN, once daily. Dx E11.8 100 each 11  . losartan (COZAAR) 100 MG tablet Take 1 tablet (100 mg total) by mouth daily. 90 tablet 1  . sildenafil (VIAGRA) 100 MG tablet Take 0.5-1 tablets (50-100 mg total) by mouth daily as  needed for erectile dysfunction. 5 tablet 11  . SitaGLIPtin-MetFORMIN HCl (JANUMET XR) 50-1000 MG TB24 Take 1 tablet by mouth daily. Pt will bring coupon card 30 tablet 5  . zolpidem (AMBIEN) 10 MG tablet Take 1 tablet (10 mg total) by mouth at bedtime as needed for sleep. 30 tablet 3   No facility-administered medications prior to visit.     Allergies  Allergen Reactions  . Gabapentin Other (See Comments)    Drowsiness   . Sulfonamide Derivatives     REACTION: pt. can't remember reaction    ROS Review of Systems    Objective:    Physical Exam  BP (!) 143/66   Pulse (!) 56   Ht 5\' 8"  (1.727 m)   Wt 184 lb (83.5 kg)   SpO2 99%   BMI 27.98 kg/m  Wt Readings from Last 3 Encounters:  03/10/18 184 lb (83.5 kg)  02/14/18 181 lb (82.1 kg)  01/05/18 171 lb (77.6 kg)     Health Maintenance Due  Topic Date Due  . HIV Screening  02/23/1984  . FOOT EXAM  01/04/2018    There are no preventive care reminders to display for this patient.  Lab Results  Component Value Date   TSH 1.90 01/05/2017   Lab Results  Component Value Date   WBC 8.5 01/05/2017   HGB 15.8 01/05/2017   HCT 47.0 01/05/2017   MCV 85.6 01/05/2017   PLT 229 01/05/2017   Lab Results  Component Value Date   NA 136 11/14/2017   K 5.1 11/14/2017   CO2 30 11/14/2017   GLUCOSE 168 (H) 11/14/2017   BUN 8 11/14/2017   CREATININE 0.99 11/14/2017   BILITOT 0.3 11/14/2017   ALKPHOS 103 04/29/2015   AST 21 11/14/2017   ALT 19 11/14/2017   PROT 6.2 11/14/2017   ALBUMIN 4.2 04/29/2015   CALCIUM 9.1 11/14/2017   Lab Results   Component Value Date   CHOL 113 11/14/2017   Lab Results  Component Value Date   HDL 33 (L) 11/14/2017   Lab Results  Component Value Date   LDLCALC 64 11/14/2017   Lab Results  Component Value Date   TRIG 81 11/14/2017   Lab Results  Component Value Date   CHOLHDL 3.4 11/14/2017   Lab Results  Component Value Date   HGBA1C 5.9 (A) 01/05/2018      Assessment & Plan:   Problem List Items Addressed This Visit      Cardiovascular and Mediastinum   HYPERTENSION, BENIGN ESSENTIAL - Primary     Respiratory   Pneumonia   Relevant Medications   levofloxacin (LEVAQUIN) 750 MG tablet   benzonatate (TESSALON) 200 MG capsule   Other Relevant Orders   DG Chest 2 View     Bilateral pneumonia- will send rx for tessalon perles. Will change to Levaquin.  Go for CXR in about 2 weeks.  Call if not continuing to improve.   HTN- borderline but will keep an eye on it.    Meds ordered this encounter  Medications  . DISCONTD: benzonatate (TESSALON) 200 MG capsule    Sig: Take 1 capsule (200 mg total) by mouth 3 (three) times daily as needed for cough.    Dispense:  30 capsule    Refill:  0  . DISCONTD: levofloxacin (LEVAQUIN) 750 MG tablet    Sig: Take 1 tablet (750 mg total) by mouth daily.    Dispense:  7 tablet    Refill:  0  .  levofloxacin (LEVAQUIN) 750 MG tablet    Sig: Take 1 tablet (750 mg total) by mouth daily.    Dispense:  7 tablet    Refill:  0  . benzonatate (TESSALON) 200 MG capsule    Sig: Take 1 capsule (200 mg total) by mouth 3 (three) times daily as needed for cough.    Dispense:  30 capsule    Refill:  0    Follow-up: Return if symptoms worsen or fail to improve.    Nani Gasser, MD

## 2018-03-15 ENCOUNTER — Other Ambulatory Visit: Payer: Self-pay | Admitting: Family Medicine

## 2018-03-15 DIAGNOSIS — F5101 Primary insomnia: Secondary | ICD-10-CM

## 2018-03-20 ENCOUNTER — Other Ambulatory Visit (HOSPITAL_COMMUNITY): Payer: Self-pay | Admitting: Psychiatry

## 2018-03-24 ENCOUNTER — Other Ambulatory Visit: Payer: Self-pay | Admitting: Family Medicine

## 2018-03-24 ENCOUNTER — Ambulatory Visit (HOSPITAL_COMMUNITY): Payer: BLUE CROSS/BLUE SHIELD | Admitting: Licensed Clinical Social Worker

## 2018-03-24 ENCOUNTER — Other Ambulatory Visit: Payer: Self-pay | Admitting: *Deleted

## 2018-03-24 ENCOUNTER — Ambulatory Visit (INDEPENDENT_AMBULATORY_CARE_PROVIDER_SITE_OTHER): Payer: BLUE CROSS/BLUE SHIELD

## 2018-03-24 DIAGNOSIS — N529 Male erectile dysfunction, unspecified: Secondary | ICD-10-CM

## 2018-03-24 DIAGNOSIS — R05 Cough: Secondary | ICD-10-CM

## 2018-03-24 DIAGNOSIS — R0602 Shortness of breath: Secondary | ICD-10-CM

## 2018-03-24 DIAGNOSIS — J189 Pneumonia, unspecified organism: Secondary | ICD-10-CM

## 2018-03-24 MED ORDER — SILDENAFIL CITRATE 100 MG PO TABS: 100.0000 mg | ORAL_TABLET | Freq: Every day | ORAL | 3 refills | Status: DC | PRN

## 2018-03-24 MED ORDER — SILDENAFIL CITRATE 20 MG PO TABS: 40.0000 mg | ORAL_TABLET | Freq: Every day | ORAL | 3 refills | Status: DC | PRN

## 2018-03-24 MED ORDER — SILDENAFIL CITRATE 100 MG PO TABS: 50.0000 mg | ORAL_TABLET | Freq: Every day | ORAL | 11 refills | Status: DC | PRN

## 2018-03-24 NOTE — Telephone Encounter (Signed)
Pt walked in today and wanted a rx for the [facility] printed so he can take it to [facility] pharmacy.  I redid the prescription for 20mg   tabs as they are usually able to get that for $120 for 50 tabs,  Vs 10 of the 100mg   tabs is $120.

## 2018-04-05 ENCOUNTER — Other Ambulatory Visit: Payer: Self-pay | Admitting: *Deleted

## 2018-04-05 DIAGNOSIS — S61402A Unspecified open wound of left hand, initial encounter: Secondary | ICD-10-CM | POA: Diagnosis not present

## 2018-04-05 DIAGNOSIS — S61201A Unspecified open wound of left index finger without damage to nail, initial encounter: Secondary | ICD-10-CM | POA: Diagnosis not present

## 2018-04-05 DIAGNOSIS — Y999 Unspecified external cause status: Secondary | ICD-10-CM | POA: Diagnosis not present

## 2018-04-05 DIAGNOSIS — W298XXA Contact with other powered powered hand tools and household machinery, initial encounter: Secondary | ICD-10-CM | POA: Diagnosis not present

## 2018-04-06 ENCOUNTER — Ambulatory Visit: Payer: BLUE CROSS/BLUE SHIELD | Admitting: Family Medicine

## 2018-04-07 ENCOUNTER — Ambulatory Visit (INDEPENDENT_AMBULATORY_CARE_PROVIDER_SITE_OTHER): Payer: BLUE CROSS/BLUE SHIELD | Admitting: Family Medicine

## 2018-04-07 ENCOUNTER — Encounter: Payer: Self-pay | Admitting: Family Medicine

## 2018-04-07 VITALS — BP 148/88 | HR 66 | Ht 68.0 in | Wt 189.0 lb

## 2018-04-07 DIAGNOSIS — I10 Essential (primary) hypertension: Secondary | ICD-10-CM

## 2018-04-07 DIAGNOSIS — S61301A Unspecified open wound of left index finger with damage to nail, initial encounter: Secondary | ICD-10-CM

## 2018-04-07 DIAGNOSIS — F419 Anxiety disorder, unspecified: Secondary | ICD-10-CM

## 2018-04-07 DIAGNOSIS — E118 Type 2 diabetes mellitus with unspecified complications: Secondary | ICD-10-CM | POA: Diagnosis not present

## 2018-04-07 LAB — POCT GLYCOSYLATED HEMOGLOBIN (HGB A1C): Hemoglobin A1C: 5.8 % — AB (ref 4.0–5.6)

## 2018-04-07 MED ORDER — CLONAZEPAM 0.5 MG PO TABS: 0.5000 mg | ORAL_TABLET | Freq: Two times a day (BID) | ORAL | 0 refills | Status: DC | PRN

## 2018-04-07 MED ORDER — BUSPIRONE HCL 15 MG PO TABS: 15.0000 mg | ORAL_TABLET | Freq: Three times a day (TID) | ORAL | 2 refills | Status: DC

## 2018-04-07 MED ORDER — BASAGLAR KWIKPEN 100 UNIT/ML ~~LOC~~ SOPN: PEN_INJECTOR | SUBCUTANEOUS | 3 refills | Status: DC

## 2018-04-07 NOTE — Patient Instructions (Signed)
K to cut back on Basaglar to 30 units.

## 2018-04-07 NOTE — Progress Notes (Signed)
Subjective:    CC: DM  HPI:  Diabetes - no hypoglycemic events. No wounds or sores that are not healing well. No increased thirst or urination. Checking glucose at home. Taking medications as prescribed without any side effects.  Blood sugars have been well controlled.  Though admits he has been eating a lot of fruit.  He has really tried hard to cut back on his smoking.  Hypertension- Pt denies chest pain, SOB, dizziness, or heart palpitations.  Taking meds as directed w/o problems.  Denies medication side effects.    He is also here to let me address a left index finger injury.  He was actually seen at the emergency department 2 days ago.  His finger got caught on the edge of a drill bit he did have gloves on but it ripped the skin off the end of his finger.  He has been cleaning it with soapy water and then applying bacitracin and keeping it covered.  Straight was negative for fracture.  He was placed on Keflex.  Still feeling extremely anxious.  We had added BuSpar to his regimen he wanted know if he could have a small supply of Xanax or Klonopin.  Just for a month.  His girlfriend who has lived with him for quite some time unfortunately is suffering from a drug addiction and he is finally at the point of ending the relationship.  She has moved out and he is just feeling a little stressed and overwhelmed right now.  Past medical history, Surgical history, Family history not pertinant except as noted below, Social history, Allergies, and medications have been entered into the medical record, reviewed, and corrections made.   Review of Systems: No fevers, chills, night sweats, weight loss, chest pain, or shortness of breath.   Objective:    General: Well Developed, well nourished, and in no acute distress.  Neuro: Alert and oriented x3, extra-ocular muscles intact, sensation grossly intact.  HEENT: Normocephalic, atraumatic  Skin: Warm and dry, no rashes.  Wound on left index finger appears  to be clean.  There is quite a amount of surface skin that is just completely missing with tissue underneath exposed.  I do not see any drainage or foul smell.  Right now the nail is still attached but will likely fall off.   Cardiac: Regular rate and rhythm, no murmurs rubs or gallops, no lower extremity edema.  Respiratory: Clear to auscultation bilaterally. Not using accessory muscles, speaking in full sentences.   Impression and Recommendations:    DM -back down on Basaglar to 30 units.doing really well.  A1c looks fantastic.  Follow-up in 3 months.  HTN -  BP borderline.  Will   Anxiety -I did give him a short-term supply of clonazepam.  Only given for 1 month and will not refill.  Encouraged him to use very sparingly.  He is still on the fluoxetine as well as BuSpar.  Crease BuSpar to 15 mg 3 times a day.  Wound open, left index finger - appears to be clean.  There is quite a amount of surface skin that is just completely missing with tissue underneath exposed.  I do not see any drainage or foul smell.  Right now the nail is still attached but will likely fall off.  Continue to wash with soapy water pat dry and apply Vaseline.  Complete the Keflex.

## 2018-04-14 ENCOUNTER — Ambulatory Visit (HOSPITAL_COMMUNITY): Payer: BLUE CROSS/BLUE SHIELD | Admitting: Licensed Clinical Social Worker

## 2018-05-02 ENCOUNTER — Other Ambulatory Visit: Payer: Self-pay | Admitting: Family Medicine

## 2018-05-24 ENCOUNTER — Other Ambulatory Visit: Payer: Self-pay | Admitting: Family Medicine

## 2018-05-25 ENCOUNTER — Telehealth: Payer: Self-pay | Admitting: Emergency Medicine

## 2018-05-25 ENCOUNTER — Telehealth: Payer: Self-pay

## 2018-05-25 ENCOUNTER — Telehealth: Payer: BLUE CROSS/BLUE SHIELD | Admitting: Family

## 2018-05-25 DIAGNOSIS — R05 Cough: Secondary | ICD-10-CM

## 2018-05-25 DIAGNOSIS — J029 Acute pharyngitis, unspecified: Secondary | ICD-10-CM

## 2018-05-25 DIAGNOSIS — R059 Cough, unspecified: Secondary | ICD-10-CM

## 2018-05-25 DIAGNOSIS — R0602 Shortness of breath: Secondary | ICD-10-CM

## 2018-05-25 NOTE — Telephone Encounter (Signed)
Patient advised. Forms sent by MyChart to patient.

## 2018-05-25 NOTE — Telephone Encounter (Signed)
Patient called and reports close contact with someone who tested positive for the COVID-19. Patient has a cough. Denies fever.  Patient advised to self quarantine and go to [email or web address] to do an e-visit. You will be directed to next steps.    5 minutes spent educating patient on how to self quarantine and how to start an E-visit.

## 2018-05-25 NOTE — Progress Notes (Signed)
3.0 E-Visit for Corona Virus Screening  Based on what you have shared with me, you need to seek an evaluation for a severe illness that is causing your symptoms which may be coronavirus or some other illness. I recommend that you be seen and evaluated "face to face". Our Emergency Departments are best equipped to handle patients with severe symptoms.   I recommend the following:  . If you are having a true medical emergency please call 911. . If you are considered high risk for Corona virus because of a known exposure, fever, shortness of breath and cough, OR if you have severe symptoms of any kind, seek medical care at an emergency room.  . Please call ahead and tell them that you were seen by telemedicine and they have recommended that you have a face to face evaluation. . [facility] Emergency Department [address], [place], [place] [address] [phone]  . [facility] [place] Emergency Department [address], [place], [place] [address] [phone]  . [facility] Emergency Department [address], [place], [place] [address] [phone]  . [facility] Emergency Department [address], [place], [place] [address] [phone]  . [facility] Emergency Department [address], [place],  [address] [phone]  NOTE: If you entered your credit card information for this eVisit, you will not be charged. You may see a "hold" on your card for the $35 but that hold will drop off and you will not have a charge processed.   Your e-visit answers were reviewed by a board certified advanced clinical practitioner to complete your personal care plan.  Thank you for using e-Visits.  

## 2018-05-25 NOTE — Telephone Encounter (Signed)
I think he sign up for [place] to test.  We will go ahead and fax over order for him to go to the testing tent.  Please send him the documents through my chart if he is able to print them and signed them that would be helpful and he can take them with him.  If not then he can sign the forms there.  It will just expedite the process.

## 2018-05-26 ENCOUNTER — Ambulatory Visit (HOSPITAL_COMMUNITY): Payer: BLUE CROSS/BLUE SHIELD | Admitting: Licensed Clinical Social Worker

## 2018-05-29 ENCOUNTER — Telehealth: Payer: Self-pay

## 2018-05-29 MED ORDER — LOSARTAN POTASSIUM 100 MG PO TABS: 100.0000 mg | ORAL_TABLET | Freq: Every day | ORAL | 1 refills | Status: DC

## 2018-05-29 NOTE — Telephone Encounter (Signed)
Larry Lawson called and states his insurance will not tell him what insulin is covered under his plan. He just needs a different medication because the Mariella Saa is not covered. I tried to call the insurance company.   [employer] [phone]

## 2018-05-31 ENCOUNTER — Other Ambulatory Visit: Payer: Self-pay

## 2018-05-31 MED ORDER — CLONAZEPAM 0.5 MG PO TABS: 0.5000 mg | ORAL_TABLET | Freq: Two times a day (BID) | ORAL | 0 refills | Status: DC | PRN

## 2018-05-31 MED ORDER — FLUOXETINE HCL 40 MG PO CAPS: 40.0000 mg | ORAL_CAPSULE | Freq: Every day | ORAL | 1 refills | Status: DC

## 2018-05-31 NOTE — Telephone Encounter (Signed)
Basaglar needs a PA  °

## 2018-05-31 NOTE — Telephone Encounter (Signed)
Larry Lawson is requesting a refill on clonazepam. He states he is having a lot more anxiety at this time.

## 2018-05-31 NOTE — Telephone Encounter (Signed)
Pt advised.

## 2018-06-01 MED ORDER — INSULIN GLARGINE (1 UNIT DIAL) 300 UNIT/ML ~~LOC~~ SOPN: 36.0000 [IU] | PEN_INJECTOR | Freq: Every day | SUBCUTANEOUS | 3 refills | Status: DC

## 2018-06-01 NOTE — Telephone Encounter (Signed)
I called and spoke with Westly Pam and she stated that since this patient has BC[employer] of [place] they will not allow online PA's. I was then transferred to Sue Lush and she states that the preferred is Lantus or Toujeo and they are the only medications on this patients formulary. Please advise.

## 2018-06-01 NOTE — Telephone Encounter (Signed)
Ok, will change to [employer].

## 2018-06-01 NOTE — Telephone Encounter (Signed)
Pt advised..Cevin Rubinstein Lynetta, CMA  

## 2018-06-08 ENCOUNTER — Telehealth: Payer: Self-pay | Admitting: Family Medicine

## 2018-06-08 NOTE — Telephone Encounter (Signed)
Pt advised, will print from MyChart

## 2018-06-08 NOTE — Telephone Encounter (Signed)
I am happy to write a letter stating that he is high risk for coded because of his diabetes and to allow him to do alternative work duties or work from home.  That is what we have been offering to most patients who are higher risk.  Certainly though it would be up to his company whether or not he got paid during that timeframe.

## 2018-06-08 NOTE — Telephone Encounter (Signed)
Pt advised of COVID results. 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  Pt states he was told his job was "essential" but is very anxious about returning to work since he has to come in contact with so many people.   He states his main health concern is just his diabetes, but wants to know if Dr Linford Arnold thinks it is safe in general for him to preform these work duties.   Patient will need a note stating negative COVID, but also needs letter to know if it is safe to return to work due to possible exposures. If he does return to work, requesting recommendations on how to protect himself and what precautions to take.

## 2018-06-08 NOTE — Telephone Encounter (Signed)
Pt agreeable and states to me that he understands this does not guarantee payment from his company during this time.  Requesting letter be written to excuse him for the month of April.  Pt will come pick up when it is ready  Thanks!!!

## 2018-06-08 NOTE — Telephone Encounter (Signed)
OK letter under MyChart.

## 2018-06-08 NOTE — Telephone Encounter (Signed)
Please call pt" COVID test is negative. Ok to return to work.

## 2018-06-09 ENCOUNTER — Ambulatory Visit (HOSPITAL_COMMUNITY): Payer: BLUE CROSS/BLUE SHIELD | Admitting: Licensed Clinical Social Worker

## 2018-06-09 LAB — NOVEL CORONAVIRUS, NAA: SARS-COV-2, NAA: NOT DETECTED

## 2018-06-14 ENCOUNTER — Telehealth: Payer: Self-pay | Admitting: *Deleted

## 2018-06-30 ENCOUNTER — Ambulatory Visit (HOSPITAL_COMMUNITY): Payer: BLUE CROSS/BLUE SHIELD | Admitting: Licensed Clinical Social Worker

## 2018-07-06 ENCOUNTER — Encounter: Payer: Self-pay | Admitting: Family Medicine

## 2018-07-06 ENCOUNTER — Ambulatory Visit (INDEPENDENT_AMBULATORY_CARE_PROVIDER_SITE_OTHER): Payer: BLUE CROSS/BLUE SHIELD | Admitting: Family Medicine

## 2018-07-06 VITALS — BP 140/85 | HR 92 | Wt 185.0 lb

## 2018-07-06 DIAGNOSIS — F419 Anxiety disorder, unspecified: Secondary | ICD-10-CM | POA: Diagnosis not present

## 2018-07-06 DIAGNOSIS — I10 Essential (primary) hypertension: Secondary | ICD-10-CM | POA: Diagnosis not present

## 2018-07-06 DIAGNOSIS — E118 Type 2 diabetes mellitus with unspecified complications: Secondary | ICD-10-CM | POA: Diagnosis not present

## 2018-07-06 DIAGNOSIS — Z794 Long term (current) use of insulin: Secondary | ICD-10-CM | POA: Diagnosis not present

## 2018-07-06 MED ORDER — CLONAZEPAM 0.5 MG PO TABS: 0.5000 mg | ORAL_TABLET | Freq: Every day | ORAL | 0 refills | Status: DC | PRN

## 2018-07-06 MED ORDER — INSULIN PEN NEEDLE 31G X 8 MM MISC: 11 refills | Status: DC

## 2018-07-06 MED ORDER — HYDROCHLOROTHIAZIDE 12.5 MG PO CAPS: 12.5000 mg | ORAL_CAPSULE | Freq: Every day | ORAL | 0 refills | Status: DC

## 2018-07-06 NOTE — Progress Notes (Signed)
Still taking Basaglar 28 u daily (not on current medication list).Heath Gold, CMA

## 2018-07-06 NOTE — Progress Notes (Signed)
Virtual Visit via Video Note  I connected with Larry Lawson on 07/06/18 at  2:40 PM EDT by a video enabled telemedicine application and verified that I am speaking with the correct person using two identifiers.   I discussed the limitations of evaluation and management by telemedicine and the availability of in person appointments. The patient expressed understanding and agreed to proceed.  Pt was at home and I was in my office for the virtual visit.     Subjective:    CC: DM and BP  HPI:  Diabetes - no hypoglycemic events. No wounds or sores that are not healing well. No increased thirst or urination. Checking glucose at home. Taking medications as prescribed without any side effects.  Hypertension- Pt denies chest pain, SOB, dizziness, or heart palpitations.  Taking meds as directed w/o problems.  Denies medication side effects.  Home Bps running 135-140s.      F/U Anxiety - has had more stress staying at home, working less and not being ablt to see his kids much ( they are staying with his ex-wife).  He stopped the buspar bc felt it really wasn't heling him.  Has been using the clonazepam and says it really helps him.      Past medical history, Surgical history, Family history not pertinant except as noted below, Social history, Allergies, and medications have been entered into the medical record, reviewed, and corrections made.   Review of Systems: No fevers, chills, night sweats, weight loss, chest pain, or shortness of breath.   Objective:    General: Speaking clearly in complete sentences without any shortness of breath.  Alert and oriented x3.  Normal judgment. No apparent acute distress. Well groomed.    Impression and Recommendations:    DM - Home sugars well controlled. F/U in 3 months. Will check labs at that time.   Lab Results  Component Value Date   HGBA1C 5.8 (A) 04/07/2018    Anxiety - under fair control. On fluoxetine.  Off the buspar.  Will refill  klonopin but reminded him to use sparingly.    HTN - not well controlled. Discussed adding HCT to his regimen. Discussed it is a diuretic. Will start with 12.5mg  .  If dose well can combine with his losartan 100mg .        I discussed the assessment and treatment plan with the patient. The patient was provided an opportunity to ask questions and all were answered. The patient agreed with the plan and demonstrated an understanding of the instructions.   The patient was advised to call back or seek an in-person evaluation if the symptoms worsen or if the condition fails to improve as anticipated.   Nani Gasser, MD

## 2018-07-07 ENCOUNTER — Ambulatory Visit: Payer: Self-pay | Admitting: Family Medicine

## 2018-07-23 ENCOUNTER — Other Ambulatory Visit: Payer: Self-pay | Admitting: Family Medicine

## 2018-07-27 ENCOUNTER — Other Ambulatory Visit: Payer: Self-pay | Admitting: Family Medicine

## 2018-07-28 NOTE — Telephone Encounter (Signed)
Last OV was 07/06/2018. Please advise.

## 2018-08-11 DIAGNOSIS — Z20828 Contact with and (suspected) exposure to other viral communicable diseases: Secondary | ICD-10-CM | POA: Diagnosis not present

## 2018-08-23 ENCOUNTER — Other Ambulatory Visit: Payer: Self-pay | Admitting: Family Medicine

## 2018-08-29 ENCOUNTER — Ambulatory Visit (INDEPENDENT_AMBULATORY_CARE_PROVIDER_SITE_OTHER): Payer: BC Managed Care – PPO | Admitting: Family Medicine

## 2018-08-29 ENCOUNTER — Ambulatory Visit (INDEPENDENT_AMBULATORY_CARE_PROVIDER_SITE_OTHER): Payer: BC Managed Care – PPO

## 2018-08-29 ENCOUNTER — Other Ambulatory Visit: Payer: Self-pay

## 2018-08-29 ENCOUNTER — Encounter: Payer: Self-pay | Admitting: Family Medicine

## 2018-08-29 VITALS — BP 169/92 | HR 79 | Temp 98.3°F | Wt 193.0 lb

## 2018-08-29 DIAGNOSIS — M25511 Pain in right shoulder: Secondary | ICD-10-CM | POA: Diagnosis not present

## 2018-08-29 MED ORDER — CYCLOBENZAPRINE HCL 10 MG PO TABS: 10.0000 mg | ORAL_TABLET | Freq: Three times a day (TID) | ORAL | 1 refills | Status: DC | PRN

## 2018-08-29 MED ORDER — DICLOFENAC SODIUM 1 % TD GEL: 4.0000 g | Freq: Four times a day (QID) | TRANSDERMAL | 11 refills | Status: DC

## 2018-08-29 NOTE — Progress Notes (Signed)
Larry Lawson is a 48 y.o. male who presents to [facility] today for right shoulder pain.  Patient fell about a week ago landing on his arms.  He developed right shoulder pain.  He notes continued pain and popping in his right shoulder especially with motion.  He notes overhead motion and reaching back and at bedtime are painful.  Pain is located primarily at the lateral upper arm and at the posterior shoulder.  He denies any radiating pain weakness or numbness distally.  No fevers or chills distally.  He is tried Aleve for pain control which has helped.     ROS:  As above  Exam:  There were no vitals taken for this visit. Wt Readings from Last 5 Encounters:  07/06/18 185 lb (83.9 kg)  04/07/18 189 lb (85.7 kg)  03/10/18 184 lb (83.5 kg)  02/14/18 181 lb (82.1 kg)  01/05/18 171 lb (77.6 kg)   General: Well Developed, well nourished, and in no acute distress.  Neuro/Psych: Alert and oriented x3, extra-ocular muscles intact, able to move all 4 extremities, sensation grossly intact. Skin: Warm and dry, no rashes noted.  Respiratory: Not using accessory muscles, speaking in full sentences, trachea midline.  Cardiovascular: Pulses palpable, no extremity edema. Abdomen: Does not appear distended. MSK:  C-spine: Nontender to spinal midline normal cervical motion. Right shoulder: Normal-appearing not particularly tender. Range of motion normal abduction over pain beyond 100 degrees. Normal external rotation. Internal rotation limited to lumbar spine. Strength: Diminished abduction 4/5 and external rotation 4+/5.  Normal internal rotation. Positive Hawkins and Neer's test.  Positive empty can test. Positive clunk and click with shoulder motion.  Contralateral left shoulder normal-appearing nontender normal motion normal strength negative impingement testing.    Lab and Radiology Results X-ray images right shoulder personally  independently reviewed No obvious fractures or deformity.  Mild AC DJD present. Await formal radiology review  Limited musculoskeletal ultrasound right shoulder. Intact biceps tendon. Intact subscapularis tendon. Intact supraspinatus tendon.  Slight cortical defect present deep to the tendon visible at supraspinatus view.  No increased Doppler activity at this area. Intact infraspinatus tendon. Mild AC joint effusion and degenerative changes present. Impression: Possible Pignato-Sachs fracture with rotator cuff tendinopathy.  Assessment and Plan: 48 y.o. male with right shoulder pain after fall.  X-ray largely unremarkable.  Ultrasound shows a possible Sarria-Sachs fracture but I am not certain.  X-ray to my read is not very remarkable however radiology over read is still pending.  Plan for home exercise program.  Recheck in about 2 to 3 weeks.  If not improving likely proceed with MRI arthrogram.   PDMP not reviewed this encounter. No orders of the defined types were placed in this encounter.  No orders of the defined types were placed in this encounter.   Historical information moved to improve visibility of documentation.  Past Medical History:  Diagnosis Date  . Diabetes mellitus   . Heart murmur   . Heart murmur   . Hypertension    No past surgical history on file. Social History   Tobacco Use  . Smoking status: Light Tobacco Smoker    Packs/day: 0.50  . Smokeless tobacco: Current User  Substance Use Topics  . Alcohol use: Yes   family history includes Cancer in his unknown relative; Diabetes in his brother; Heart attack in his unknown relative; Heart attack (age of onset: 2846) in his brother; Heart disease in his paternal uncle; Heart failure in his  brother and mother; Hyperlipidemia in his mother; Hypertension in his father; Prostate cancer in his father.  Medications: Current Outpatient Medications  Medication Sig Dispense Refill  . albuterol (VENTOLIN HFA) 108 (90  Base) MCG/ACT inhaler INHALE 2 PUFFS INTO THE LUNGS EVERY 6 HOURS AS NEEDED FOR WHEEZING. 18 each 0  . AMBULATORY NON FORMULARY MEDICATION Medication Name: Glucometer with strips and lancets to check daily.  Dx: Diabetes 250.00 1 Units 3  . atorvastatin (LIPITOR) 40 MG tablet TAKE 1 TABLET BY MOUTH EVERY DAY 90 tablet 3  . clonazePAM (KLONOPIN) 0.5 MG tablet Take 1 tablet (0.5 mg total) by mouth daily as needed for anxiety. This is a 30 day supply 20 tablet 0  . FLUoxetine (PROZAC) 40 MG capsule Take 1 capsule (40 mg total) by mouth daily. 90 capsule 1  . hydrochlorothiazide (MICROZIDE) 12.5 MG capsule Take 1 capsule (12.5 mg total) by mouth daily. 90 capsule 0  . Insulin Pen Needle (B-D ULTRAFINE III SHORT PEN) 31G X 8 MM MISC USE  TO ADMINISTER INSULIN, once daily. Dx E11.8 100 each 11  . JANUMET XR 50-1000 MG TB24 TAKE 1 TABLET BY MOUTH EVERY DAY 30 tablet 5  . losartan (COZAAR) 100 MG tablet Take 1 tablet (100 mg total) by mouth daily. 90 tablet 1  . sildenafil (REVATIO) 20 MG tablet Take 2-5 tablets (40-100 mg total) by mouth daily as needed. (Patient not taking: Reported on 07/06/2018) 50 tablet 3  . sildenafil (VIAGRA) 100 MG tablet Take 1 tablet (100 mg total) by mouth daily as needed for erectile dysfunction. 10 tablet 3  . TOUJEO SOLOSTAR 300 UNIT/ML SOPN INJECT 36 UNITS INTO THE SKIN AT BEDTIME. 9 pen 1  . zolpidem (AMBIEN) 10 MG tablet TAKE 1 TABLET (10 MG TOTAL) BY MOUTH AT BEDTIME AS NEEDED FOR SLEEP. 30 tablet 2   No current facility-administered medications for this visit.    Allergies  Allergen Reactions  . Gabapentin Other (See Comments)    Drowsiness   . Sulfonamide Derivatives     REACTION: pt. can't remember reaction      Discussed warning signs or symptoms. Please see discharge instructions. Patient expresses understanding.

## 2018-08-29 NOTE — Patient Instructions (Addendum)
Thank you for coming in today. Work on shoulder exercises.  With the band Elbow straight: Up to the level of the shoulder in the front.  Elbow straight: Up to the level of the shoulder in the side Internal rotation External rotation  Rows Elbow straight arms back to the side pinch the shoulder blades Bending elbow bring the arms back to the chest pinching shoulder bladed  Do about 10-30 reps 2x daily.  Avoid activity that causes pain >3/10  Ok to take muscle relaxer. This may make you sleepy.   Ok to also use diclofenac gel 4x daily.   Keep me updated.   Recheck in 2-3 weeks especially if not better.   That follow up could potentially be video.

## 2018-10-03 ENCOUNTER — Other Ambulatory Visit: Payer: Self-pay | Admitting: Family Medicine

## 2018-10-09 ENCOUNTER — Ambulatory Visit: Payer: BLUE CROSS/BLUE SHIELD | Admitting: Family Medicine

## 2018-10-16 ENCOUNTER — Ambulatory Visit: Payer: BC Managed Care – PPO | Admitting: Family Medicine

## 2018-10-17 ENCOUNTER — Telehealth: Payer: Self-pay

## 2018-10-17 MED ORDER — LOSARTAN POTASSIUM-HCTZ 100-12.5 MG PO TABS: 1.0000 | ORAL_TABLET | Freq: Every day | ORAL | 3 refills | Status: DC

## 2018-10-17 NOTE — Telephone Encounter (Signed)
Pt advised.

## 2018-10-17 NOTE — Telephone Encounter (Signed)
Larry Lawson called and states he is almost out of the Losartan. He states Dr Madilyn Fireman was going to prescribe the combination HCTZ and Losartan. Please advise.

## 2018-10-17 NOTE — Telephone Encounter (Signed)
OK new rx sent and old ones removed from med list.

## 2018-10-23 ENCOUNTER — Ambulatory Visit: Payer: BC Managed Care – PPO | Admitting: Family Medicine

## 2018-10-24 ENCOUNTER — Encounter: Payer: Self-pay | Admitting: Family Medicine

## 2018-10-24 ENCOUNTER — Ambulatory Visit (INDEPENDENT_AMBULATORY_CARE_PROVIDER_SITE_OTHER): Payer: BC Managed Care – PPO | Admitting: Family Medicine

## 2018-10-24 ENCOUNTER — Other Ambulatory Visit: Payer: Self-pay

## 2018-10-24 VITALS — BP 141/87 | HR 83 | Ht 68.0 in | Wt 191.0 lb

## 2018-10-24 DIAGNOSIS — Z794 Long term (current) use of insulin: Secondary | ICD-10-CM | POA: Diagnosis not present

## 2018-10-24 DIAGNOSIS — E118 Type 2 diabetes mellitus with unspecified complications: Secondary | ICD-10-CM | POA: Diagnosis not present

## 2018-10-24 DIAGNOSIS — I10 Essential (primary) hypertension: Secondary | ICD-10-CM | POA: Diagnosis not present

## 2018-10-24 DIAGNOSIS — F419 Anxiety disorder, unspecified: Secondary | ICD-10-CM

## 2018-10-24 DIAGNOSIS — R21 Rash and other nonspecific skin eruption: Secondary | ICD-10-CM

## 2018-10-24 LAB — POCT UA - MICROALBUMIN
Creatinine, POC: 50 mg/dL
Microalbumin Ur, POC: 30 mg/L

## 2018-10-24 LAB — POCT GLYCOSYLATED HEMOGLOBIN (HGB A1C): Hemoglobin A1C: 6.2 % — AB (ref 4.0–5.6)

## 2018-10-24 MED ORDER — TRIAMCINOLONE ACETONIDE 0.5 % EX OINT: 1.0000 "application " | TOPICAL_OINTMENT | Freq: Two times a day (BID) | CUTANEOUS | 0 refills | Status: DC

## 2018-10-24 MED ORDER — LOSARTAN POTASSIUM-HCTZ 100-25 MG PO TABS: 1.0000 | ORAL_TABLET | Freq: Every day | ORAL | 1 refills | Status: DC

## 2018-10-24 MED ORDER — AMBULATORY NON FORMULARY MEDICATION: 0 refills | Status: DC

## 2018-10-24 MED ORDER — CLONAZEPAM 0.5 MG PO TABS: 0.5000 mg | ORAL_TABLET | Freq: Every day | ORAL | 0 refills | Status: DC | PRN

## 2018-10-24 NOTE — Patient Instructions (Signed)

## 2018-10-24 NOTE — Assessment & Plan Note (Signed)
Refill clonazepam today.  Has been doing a pretty good job of using it sparingly.  Just have that discussion again today but did go ahead and refill it for 20 tabs.  This should last him until I see him back in 3 months.

## 2018-10-24 NOTE — Assessment & Plan Note (Signed)
We discussed the DASH diet.  In addition to getting adequate sleep and reducing stress levels to lower blood pressure.

## 2018-10-24 NOTE — Assessment & Plan Note (Signed)
Hemoglobin A1c of 6.2 today which looks great though it is up from previous of 5.8.

## 2018-10-24 NOTE — Progress Notes (Signed)
Established Patient Office Visit  Subjective:  Patient ID: Larry Lawson, male    DOB: 12-Jan-1971  Age: 47 y.o. MRN: [id number]  CC:  Chief Complaint  Patient presents with  . Diabetes  . Hypertension    HPI Larry Lawson presents for   Diabetes - no hypoglycemic events. No wounds or sores that are not healing well. No increased thirst or urination. Checking glucose at home. Taking medications as prescribed without any side effects.  Hypertension- Pt denies chest pain, SOB, dizziness, or heart palpitations.  Taking meds as directed w/o problems.  Denies medication side effects.   He also complains of a rash on his hands.  He is not sure what may have caused it is not sure if it was some type of chemical etc.  But he says initially they were more like blisters and he peeled them off.  So right now all of them are scabbed.  He says they really were not very itchy.  He would sometimes rub at them though.  He is not been using any type of cream on them.  Feels like he has one on his right hand that is actually coming back again.  Anxiety-not currently on a controller but just using Klonopin as needed.  He says he has been trying to use it sparingly.  And would like a refill today.  He says his 66 year old daughter has actually moved in with him.  She was living with his ex-wife and her boyfriend but is now moved in with him.  He says so far things actually seem to be going really well.  I try to eat dinner together every night and it has been very positive for him   Past Medical History:  Diagnosis Date  . Diabetes mellitus   . Heart murmur   . Heart murmur   . Hypertension     History reviewed. No pertinent surgical history.  Family History  Problem Relation Age of Onset  . Hyperlipidemia Mother   . Heart failure Mother   . Hypertension Father   . Prostate cancer Father   . Cancer Unknown        grandmother  . Diabetes Brother   . Heart attack Unknown        grandfather  .  Heart attack Brother 26  . Heart failure Brother   . Heart disease Paternal Uncle     Social History   Socioeconomic History  . Marital status: Married    Spouse name: Not on file  . Number of children: Not on file  . Years of education: Not on file  . Highest education level: Not on file  Occupational History  . Not on file  Social Needs  . Financial resource strain: Not on file  . Food insecurity    Worry: Not on file    Inability: Not on file  . Transportation needs    Medical: Not on file    Non-medical: Not on file  Tobacco Use  . Smoking status: Light Tobacco Smoker    Packs/day: 0.50  . Smokeless tobacco: Current User  Substance and Sexual Activity  . Alcohol use: Yes  . Drug use: No  . 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  Lifestyle  . Physical activity    Days per week: Not on file    Minutes per session: Not on file  . Stress: Not on  file  Relationships  . Social Herbalist on phone: Not on file    Gets together: Not on file    Attends religious service: Not on file    Active member of club or organization: Not on file    Attends meetings of clubs or organizations: Not on file    Relationship status: Not on file  . Intimate partner violence    Fear of current or ex partner: Not on file    Emotionally abused: Not on file    Physically abused: Not on file    Forced sexual activity: Not on file  Other Topics Concern  . Not on file  Social History Narrative  . Not on file    Outpatient Medications Prior to Visit  Medication Sig Dispense Refill  . albuterol (VENTOLIN HFA) 108 (90 Base) MCG/ACT inhaler INHALE 2 PUFFS INTO THE LUNGS EVERY 6 HOURS AS NEEDED FOR WHEEZING. 18 each 0  . AMBULATORY NON FORMULARY MEDICATION Medication Name: Glucometer with strips and lancets to check daily.  Dx: Diabetes 250.00 1 Units 3  . atorvastatin (LIPITOR) 40 MG tablet TAKE  1 TABLET BY MOUTH EVERY DAY 90 tablet 3  . diclofenac sodium (VOLTAREN) 1 % GEL Apply 4 g topically 4 (four) times daily. To affected joint. 100 g 11  . FLUoxetine (PROZAC) 40 MG capsule Take 1 capsule (40 mg total) by mouth daily. 90 capsule 1  . Insulin Pen Needle (B-D ULTRAFINE III SHORT PEN) 31G X 8 MM MISC USE  TO ADMINISTER INSULIN, once daily. Dx E11.8 100 each 11  . JANUMET XR 50-1000 MG TB24 TAKE 1 TABLET BY MOUTH EVERY DAY 30 tablet 5  . TOUJEO SOLOSTAR 300 UNIT/ML SOPN INJECT 36 UNITS INTO THE SKIN AT BEDTIME. 9 pen 1  . zolpidem (AMBIEN) 10 MG tablet TAKE 1 TABLET (10 MG TOTAL) BY MOUTH AT BEDTIME AS NEEDED FOR SLEEP. 30 tablet 2  . losartan-hydrochlorothiazide (HYZAAR) 100-12.5 MG tablet Take 1 tablet by mouth daily. 90 tablet 3  . sildenafil (VIAGRA) 100 MG tablet Take 1 tablet (100 mg total) by mouth daily as needed for erectile dysfunction. 10 tablet 3  . clonazePAM (KLONOPIN) 0.5 MG tablet Take 1 tablet (0.5 mg total) by mouth daily as needed for anxiety. This is a 30 day supply (Patient not taking: Reported on 10/24/2018) 20 tablet 0  . cyclobenzaprine (FLEXERIL) 10 MG tablet Take 1 tablet (10 mg total) by mouth 3 (three) times daily as needed for muscle spasms. 90 tablet 1  . sildenafil (REVATIO) 20 MG tablet Take 2-5 tablets (40-100 mg total) by mouth daily as needed. (Patient not taking: Reported on 07/06/2018) 50 tablet 3   No facility-administered medications prior to visit.     Allergies  Allergen Reactions  . Gabapentin Other (See Comments)    Drowsiness   . Sulfonamide Derivatives     REACTION: pt. can't remember reaction    ROS Review of Systems    Objective:    Physical Exam  Constitutional: He is oriented to person, place, and time. He appears well-developed and well-nourished.  HENT:  Head: Normocephalic and atraumatic.  Cardiovascular: Normal rate, regular rhythm and normal heart sounds.  Pulmonary/Chest: Effort normal and breath sounds normal.   Neurological: He is alert and oriented to person, place, and time.  Skin: Skin is warm and dry.  Mostly has some scattered scabs on the backs of his hands and on his forearms bilaterally.  No pustules or vesicles seen.  Psychiatric: He has a normal mood and affect. His behavior is normal.    BP (!) 141/87   Pulse 83   Ht '5\' 8"'$  (1.727 m)   Wt 191 lb (86.6 kg)   SpO2 98%   BMI 29.04 kg/m  Wt Readings from Last 3 Encounters:  10/24/18 191 lb (86.6 kg)  08/29/18 193 lb (87.5 kg)  07/06/18 185 lb (83.9 kg)     Health Maintenance Due  Topic Date Due  . HIV Screening  02/23/1984  . OPHTHALMOLOGY EXAM  05/03/2013  . HEMOGLOBIN A1C  10/06/2018  . INFLUENZA VACCINE  10/07/2018    There are no preventive care reminders to display for this patient.  Lab Results  Component Value Date   TSH 1.90 01/05/2017   Lab Results  Component Value Date   WBC 8.5 01/05/2017   HGB 15.8 01/05/2017   HCT 47.0 01/05/2017   MCV 85.6 01/05/2017   PLT 229 01/05/2017   Lab Results  Component Value Date   NA 136 11/14/2017   K 5.1 11/14/2017   CO2 30 11/14/2017   GLUCOSE 168 (H) 11/14/2017   BUN 8 11/14/2017   CREATININE 0.99 11/14/2017   BILITOT 0.3 11/14/2017   ALKPHOS 103 04/29/2015   AST 21 11/14/2017   ALT 19 11/14/2017   PROT 6.2 11/14/2017   ALBUMIN 4.2 04/29/2015   CALCIUM 9.1 11/14/2017   Lab Results  Component Value Date   CHOL 113 11/14/2017   Lab Results  Component Value Date   HDL 33 (L) 11/14/2017   Lab Results  Component Value Date   LDLCALC 64 11/14/2017   Lab Results  Component Value Date   TRIG 81 11/14/2017   Lab Results  Component Value Date   CHOLHDL 3.4 11/14/2017   Lab Results  Component Value Date   HGBA1C 6.2 (A) 10/24/2018      Assessment & Plan:   Problem List Items Addressed This Visit      Cardiovascular and Mediastinum   HYPERTENSION, BENIGN ESSENTIAL    We discussed the DASH diet.  In addition to getting adequate sleep and  reducing stress levels to lower blood pressure.      Relevant Medications   losartan-hydrochlorothiazide (HYZAAR) 100-25 MG tablet   AMBULATORY NON FORMULARY MEDICATION     Endocrine   Type 2 diabetes mellitus with complication (HCC)    Hemoglobin A1c of 6.2 today which looks great though it is up from previous of 5.8.      Relevant Medications   losartan-hydrochlorothiazide (HYZAAR) 100-25 MG tablet     Other   Anxiety    Refill clonazepam today.  Has been doing a pretty good job of using it sparingly.  Just have that discussion again today but did go ahead and refill it for 20 tabs.  This should last him until I see him back in 3 months.       Other Visit Diagnoses    Type 2 diabetes mellitus with complication, with long-term current use of insulin (HCC)    -  Primary   Relevant Medications   losartan-hydrochlorothiazide (HYZAAR) 100-25 MG tablet   Other Relevant Orders   POCT glycosylated hemoglobin (Hb A1C) (Completed)   POCT UA - Microalbumin (Completed)   Rash       Relevant Medications   triamcinolone ointment (KENALOG) 0.5 %      Rash-unclear etiology.  Will treat with a topical steroid cream.  I wonder if it is the most a contact dermatitis.  If not resolving in the next 2 to 3 weeks then please let me know.  Meds ordered this encounter  Medications  . clonazePAM (KLONOPIN) 0.5 MG tablet    Sig: Take 1 tablet (0.5 mg total) by mouth daily as needed for anxiety. This is a 30 day supply    Dispense:  20 tablet    Refill:  0  . triamcinolone ointment (KENALOG) 0.5 %    Sig: Apply 1 application topically 2 (two) times daily.    Dispense:  45 g    Refill:  0  . losartan-hydrochlorothiazide (HYZAAR) 100-25 MG tablet    Sig: Take 1 tablet by mouth daily.    Dispense:  90 tablet    Refill:  1  . AMBULATORY NON FORMULARY MEDICATION    Sig: Medication Name: Blood Pressure Cuff    Dispense:  1 kit    Refill:  0    Follow-up: Return in about 3 months (around  01/24/2019) for Diabetes follow-up.    Beatrice Lecher, MD

## 2018-11-03 ENCOUNTER — Other Ambulatory Visit: Payer: Self-pay | Admitting: Family Medicine

## 2018-11-06 ENCOUNTER — Encounter: Payer: Self-pay | Admitting: *Deleted

## 2018-11-07 ENCOUNTER — Ambulatory Visit (INDEPENDENT_AMBULATORY_CARE_PROVIDER_SITE_OTHER): Payer: BC Managed Care – PPO | Admitting: Family Medicine

## 2018-11-07 ENCOUNTER — Encounter: Payer: Self-pay | Admitting: Family Medicine

## 2018-11-07 ENCOUNTER — Ambulatory Visit: Payer: BC Managed Care – PPO

## 2018-11-07 VITALS — Temp 98.6°F

## 2018-11-07 DIAGNOSIS — E162 Hypoglycemia, unspecified: Secondary | ICD-10-CM | POA: Diagnosis not present

## 2018-11-07 DIAGNOSIS — I10 Essential (primary) hypertension: Secondary | ICD-10-CM | POA: Diagnosis not present

## 2018-11-07 DIAGNOSIS — Z794 Long term (current) use of insulin: Secondary | ICD-10-CM | POA: Diagnosis not present

## 2018-11-07 DIAGNOSIS — E118 Type 2 diabetes mellitus with unspecified complications: Secondary | ICD-10-CM

## 2018-11-07 MED ORDER — TOUJEO SOLOSTAR 300 UNIT/ML ~~LOC~~ SOPN: 26.0000 [IU] | PEN_INJECTOR | Freq: Every day | SUBCUTANEOUS | 1 refills | Status: DC

## 2018-11-07 NOTE — Assessment & Plan Note (Signed)
Plans on getting a BP machine.

## 2018-11-07 NOTE — Progress Notes (Signed)
Pt reports that last Thursday  he unintentionally injected 36 U of Toujeo and he usually does 26 U. He stated that he did this around 4-5 AM that morning and turned around at 6 or 7 AM and took his normal meds he stated that this was also the 1st day that he had started on the new bp medication(losartan-hctz).I asked him if he usually eats breakfast in the morning and he stated that he does. However that morning he and his daughter were out to eat and he didn't eat anything and said that he had taken his BS and it was 55.    He stated that he had sent a text to his sister and parents about his appt w/Dr. Madilyn Fireman there was no indications of anything being wrong when he sent the text.   When I asked him about the "black outs" he said that he was going in and out.  I asked him about his routine of how he usually takes the [facility] he said that he does this around the time that he goes to bed or gets into the bed around 8-9 pm. And if he is to restless he will take the Ambien (10 mg) or Klonopin (0.5 mg).   Pt does work outdoors, I asked him if he was hydrating? He states that he does.  I did advise him to check his meter to see if this needs new batteries or new testing strips.  Maryruth Eve, Lahoma Crocker, CMA

## 2018-11-07 NOTE — Assessment & Plan Note (Signed)
Sounds most consistent with a hypoglycemic event.  We discussed what to do if he starts to feel shaky sweaty or fatigued and or if he actually checks his blood sugar and it is low.  In this case it sounds like he may have passed out a couple different times or not it off.  Still not 100% clear what happened.  Clearly he had taken too much insulin and had not eaten.  If this happens again then will consider further work-up but in this case I do think it sounds most consistent with hypoglycemia.  Note he did start his new blood pressure pill around that time as well so his blood pressure could have also been low.

## 2018-11-07 NOTE — Progress Notes (Signed)
Virtual Visit via Video Note  I connected with Larry Lawson on 11/07/18 at  1:00 PM EDT by a video enabled telemedicine application and verified that I am speaking with the correct person using two identifiers.   I discussed the limitations of evaluation and management by telemedicine and the availability of in person appointments. The patient expressed understanding and agreed to proceed.    Established Patient Office Visit  Subjective:  Patient ID: Larry Lawson, male    DOB: 10-08-1970  Age: 48 y.o. MRN: [id number]  CC:  Chief Complaint  Patient presents with  . Diabetes    HPI Larry Lawson presents for hypoglycemia.  Pt reports that last Thursday  he unintentionally injected 36 U of Toujeo and he usually does 26 U. He stated that he did this around 4-5 AM that morning and turned around at 6 or 7 AM and took his normal meds he stated that this was also the 1st day that he had started on the new bp medication(losartan-hctz).I asked him if he usually eats breakfast in the morning and he stated that he does. However that morning he and his daughter were out to eat and he didn't eat anything and said that he had taken his BS and it was 55.  thinks had a similar episodes about 6 months ago.    He stated that he had sent a text to his sister and parents about his appt w/Dr. Madilyn Fireman there was no indications of anything being wrong when he sent the text.   When I asked him about the "black outs" he said that he was going in and out.  I asked him about his routine of how he usually takes the [facility] he said that he does this around the time that he goes to bed or gets into the bed around 8-9 pm. And if he is to restless he will take the Ambien (10 mg) or Klonopin (0.5 mg).   Pt does work outdoors, I asked him if he was hydrating? He states that he does.  I did advise him to check his meter to see if this needs new batteries or new testing strips.  Past Medical History:  Diagnosis  Date  . Diabetes mellitus   . Heart murmur   . Heart murmur   . Hypertension     No past surgical history on file.  Family History  Problem Relation Age of Onset  . Hyperlipidemia Mother   . Heart failure Mother   . Hypertension Father   . Prostate cancer Father   . Cancer Unknown        grandmother  . Diabetes Brother   . Heart attack Unknown        grandfather  . Heart attack Brother 75  . Heart failure Brother   . Heart disease Paternal Uncle     Social History   Socioeconomic History  . Marital status: Married    Spouse name: Not on file  . Number of children: Not on file  . Years of education: Not on file  . Highest education level: Not on file  Occupational History  . Not on file  Social Needs  . Financial resource strain: Not on file  . Food insecurity    Worry: Not on file    Inability: Not on file  . Transportation needs    Medical: Not on file    Non-medical: Not on file  Tobacco Use  . Smoking status: Light Tobacco  Smoker    Packs/day: 0.50  . Smokeless tobacco: Current User  Substance and Sexual Activity  . Alcohol use: Yes  . Drug use: No  . 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  Lifestyle  . Physical activity    Days per week: Not on file    Minutes per session: Not on file  . Stress: Not on file  Relationships  . Social Herbalist on phone: Not on file    Gets together: Not on file    Attends religious service: Not on file    Active member of club or organization: Not on file    Attends meetings of clubs or organizations: Not on file    Relationship status: Not on file  . Intimate partner violence    Fear of current or ex partner: Not on file    Emotionally abused: Not on file    Physically abused: Not on file    Forced sexual activity: Not on file  Other Topics Concern  . Not on file  Social History Narrative  . Not on file     Outpatient Medications Prior to Visit  Medication Sig Dispense Refill  . albuterol (VENTOLIN HFA) 108 (90 Base) MCG/ACT inhaler INHALE 2 PUFFS INTO THE LUNGS EVERY 6 HOURS AS NEEDED FOR WHEEZING. 18 each 0  . AMBULATORY NON FORMULARY MEDICATION Medication Name: Glucometer with strips and lancets to check daily.  Dx: Diabetes 250.00 1 Units 3  . atorvastatin (LIPITOR) 40 MG tablet TAKE 1 TABLET BY MOUTH EVERY DAY 90 tablet 3  . clonazePAM (KLONOPIN) 0.5 MG tablet Take 1 tablet (0.5 mg total) by mouth daily as needed for anxiety. This is a 30 day supply 20 tablet 0  . diclofenac sodium (VOLTAREN) 1 % GEL Apply 4 g topically 4 (four) times daily. To affected joint. 100 g 11  . FLUoxetine (PROZAC) 40 MG capsule Take 1 capsule (40 mg total) by mouth daily. 90 capsule 1  . Insulin Pen Needle (B-D ULTRAFINE III SHORT PEN) 31G X 8 MM MISC USE  TO ADMINISTER INSULIN, once daily. Dx E11.8 100 each 11  . JANUMET XR 50-1000 MG TB24 TAKE 1 TABLET BY MOUTH EVERY DAY 30 tablet 5  . losartan-hydrochlorothiazide (HYZAAR) 100-25 MG tablet Take 1 tablet by mouth daily. 90 tablet 1  . sildenafil (VIAGRA) 100 MG tablet Take 1 tablet (100 mg total) by mouth daily as needed for erectile dysfunction. 10 tablet 3  . triamcinolone ointment (KENALOG) 0.5 % Apply 1 application topically 2 (two) times daily. 45 g 0  . zolpidem (AMBIEN) 10 MG tablet TAKE 1 TABLET (10 MG TOTAL) BY MOUTH AT BEDTIME AS NEEDED FOR SLEEP. 30 tablet 1  . TOUJEO SOLOSTAR 300 UNIT/ML SOPN INJECT 36 UNITS INTO THE SKIN AT BEDTIME. 9 pen 1  . AMBULATORY NON FORMULARY MEDICATION Medication Name: Blood Pressure Cuff 1 kit 0   No facility-administered medications prior to visit.     Allergies  Allergen Reactions  . Gabapentin Other (See Comments)    Drowsiness   . Sulfonamide Derivatives     REACTION: pt. can't remember reaction    ROS Review of Systems    Objective:    Physical Exam  Constitutional: He appears well-developed and  well-nourished.  HENT:  Head: Normocephalic and atraumatic.  Pulmonary/Chest: Effort normal.  Skin: Skin is warm.  Psychiatric: He has a normal mood and affect.  His behavior is normal.    Temp 98.6 F (37 C)  Wt Readings from Last 3 Encounters:  10/24/18 191 lb (86.6 kg)  08/29/18 193 lb (87.5 kg)  07/06/18 185 lb (83.9 kg)     Health Maintenance Due  Topic Date Due  . HIV Screening  02/23/1984  . OPHTHALMOLOGY EXAM  05/03/2013  . INFLUENZA VACCINE  10/07/2018    There are no preventive care reminders to display for this patient.  Lab Results  Component Value Date   TSH 1.90 01/05/2017   Lab Results  Component Value Date   WBC 8.5 01/05/2017   HGB 15.8 01/05/2017   HCT 47.0 01/05/2017   MCV 85.6 01/05/2017   PLT 229 01/05/2017   Lab Results  Component Value Date   NA 136 11/14/2017   K 5.1 11/14/2017   CO2 30 11/14/2017   GLUCOSE 168 (H) 11/14/2017   BUN 8 11/14/2017   CREATININE 0.99 11/14/2017   BILITOT 0.3 11/14/2017   ALKPHOS 103 04/29/2015   AST 21 11/14/2017   ALT 19 11/14/2017   PROT 6.2 11/14/2017   ALBUMIN 4.2 04/29/2015   CALCIUM 9.1 11/14/2017   Lab Results  Component Value Date   CHOL 113 11/14/2017   Lab Results  Component Value Date   HDL 33 (L) 11/14/2017   Lab Results  Component Value Date   LDLCALC 64 11/14/2017   Lab Results  Component Value Date   TRIG 81 11/14/2017   Lab Results  Component Value Date   CHOLHDL 3.4 11/14/2017   Lab Results  Component Value Date   HGBA1C 6.2 (A) 10/24/2018      Assessment & Plan:   Problem List Items Addressed This Visit      Cardiovascular and Mediastinum   HYPERTENSION, BENIGN ESSENTIAL    Plans on getting a BP machine.         Endocrine   Type 2 diabetes mellitus with complication ([place])    Sounds most consistent with a hypoglycemic event.  We discussed what to do if he starts to feel shaky sweaty or fatigued and or if he actually checks his blood sugar and it is low.  In  this case it sounds like he may have passed out a couple different times or not it off.  Still not 100% clear what happened.  Clearly he had taken too much insulin and had not eaten.  If this happens again then will consider further work-up but in this case I do think it sounds most consistent with hypoglycemia.  Note he did start his new blood pressure pill around that time as well so his blood pressure could have also been low.      Relevant Medications   Insulin Glargine, 1 Unit Dial, (TOUJEO SOLOSTAR) 300 UNIT/ML SOPN    Other Visit Diagnoses    Type 2 diabetes mellitus with complication, with long-term current use of insulin (HCC)    -  Primary   Relevant Medications   Insulin Glargine, 1 Unit Dial, (TOUJEO SOLOSTAR) 300 UNIT/ML SOPN   Hypoglycemia          Meds ordered this encounter  Medications  . Insulin Glargine, 1 Unit Dial, (TOUJEO SOLOSTAR) 300 UNIT/ML SOPN    Sig: Inject 26 Units into the skin at bedtime.    Dispense:  6 pen    Refill:  1    Follow-up: No follow-ups on file.    I discussed the assessment and treatment plan with the patient. The  patient was provided an opportunity to ask questions and all were answered. The patient agreed with the plan and demonstrated an understanding of the instructions.   The patient was advised to call back or seek an in-person evaluation if the symptoms worsen or if the condition fails to improve as anticipated.  Beatrice Lecher, MD

## 2018-11-10 ENCOUNTER — Other Ambulatory Visit: Payer: Self-pay

## 2018-11-10 ENCOUNTER — Encounter: Payer: Self-pay | Admitting: Family Medicine

## 2018-11-10 ENCOUNTER — Ambulatory Visit (INDEPENDENT_AMBULATORY_CARE_PROVIDER_SITE_OTHER): Payer: BC Managed Care – PPO | Admitting: Family Medicine

## 2018-11-10 VITALS — BP 174/82 | HR 72 | Ht 68.0 in | Wt 185.0 lb

## 2018-11-10 DIAGNOSIS — I10 Essential (primary) hypertension: Secondary | ICD-10-CM | POA: Diagnosis not present

## 2018-11-10 MED ORDER — AMLODIPINE BESYLATE 5 MG PO TABS: 5.0000 mg | ORAL_TABLET | Freq: Every day | ORAL | 0 refills | Status: DC

## 2018-11-10 NOTE — Progress Notes (Signed)
Patient is here for blood pressure check. Denies trouble with black outs (issue last visit), dizziness, palpitations, or medication problems. Patient has been taking new blood pressure medication (Hyzaar) daily. He does report drinking two cups of coffee and smoking three cigarettes this morning.   Initial blood pressure reading 174/82. After resting in the room his second blood pressure reading was 178/88. His reading at prior visit was 141/87. After consulting with Dr. Madilyn Fireman Amlodipine 5 mg tablet daily will be started and this has been sent to the pharmacy. Patient advised of new medication and to work on smoking cessation. He is to schedule a follow up with nurse in 2-3 weeks for a recheck of his blood pressure.    Toujeo discount card also given per patient request.

## 2018-11-10 NOTE — Progress Notes (Signed)
Agree with documentation as above.   [name], MD  

## 2018-11-24 ENCOUNTER — Ambulatory Visit (INDEPENDENT_AMBULATORY_CARE_PROVIDER_SITE_OTHER): Payer: BC Managed Care – PPO | Admitting: Family Medicine

## 2018-11-24 ENCOUNTER — Other Ambulatory Visit: Payer: Self-pay

## 2018-11-24 VITALS — BP 151/85 | HR 68

## 2018-11-24 DIAGNOSIS — I10 Essential (primary) hypertension: Secondary | ICD-10-CM

## 2018-11-24 NOTE — Progress Notes (Signed)
Patient is here for a [place] BP check. Denies trouble sleeping, palpitations, SOB, chest pains, ha's or medication problems. As per pt, he thinks amlodipine 5 mg is working. Pt started taking med one week. He took his bp med at 8 am today. Pt's bp reading was 151/85, pulse 68. Pt sat for 10 mins, bp reading was 133/86, pulse 70. As per provider, pt is to continue taking amlodipine as instructed. Pt will be checking bp at home and will log his results. Aware to bring in his machine and bp readings at his next visit. Pt was informed to make an appt for bp/A1c check in November with provider. Pt notified we will call with any updates or changes.

## 2018-11-24 NOTE — Progress Notes (Signed)
Agree with documentation as above. BP much better on amlodipine.   Beatrice Lecher, MD

## 2018-12-08 ENCOUNTER — Other Ambulatory Visit: Payer: Self-pay | Admitting: Family Medicine

## 2018-12-27 ENCOUNTER — Other Ambulatory Visit: Payer: Self-pay | Admitting: Family Medicine

## 2019-01-03 ENCOUNTER — Other Ambulatory Visit: Payer: Self-pay | Admitting: Family Medicine

## 2019-01-05 ENCOUNTER — Ambulatory Visit: Payer: BC Managed Care – PPO | Admitting: Family Medicine

## 2019-01-09 ENCOUNTER — Other Ambulatory Visit: Payer: Self-pay | Admitting: Family Medicine

## 2019-01-22 ENCOUNTER — Ambulatory Visit: Payer: BLUE CROSS/BLUE SHIELD | Admitting: Family Medicine

## 2019-01-22 NOTE — Progress Notes (Deleted)
Established Patient Office Visit  Subjective:  Patient ID: Larry Lawson, male    DOB: December 03, 1970  Age: 48 y.o. MRN: [id number]  CC: No chief complaint on file.   HPI Larry Lawson presents for   Diabetes - no hypoglycemic events. No wounds or sores that are not healing well. No increased thirst or urination. Checking glucose at home. Taking medications as prescribed without any side effects.  Hypertension- Pt denies chest pain, SOB, dizziness, or heart palpitations.  Taking meds as directed w/o problems.  Denies medication side effects.    F/U Anxiety -   Past Medical History:  Diagnosis Date  . Diabetes mellitus   . Heart murmur   . Heart murmur   . Hypertension     No past surgical history on file.  Family History  Problem Relation Age of Onset  . Hyperlipidemia Mother   . Heart failure Mother   . Hypertension Father   . Prostate cancer Father   . Cancer Unknown        grandmother  . Diabetes Brother   . Heart attack Unknown        grandfather  . Heart attack Brother 46  . Heart failure Brother   . Heart disease Paternal Uncle     Social History   Socioeconomic History  . Marital status: Married    Spouse name: Not on file  . Number of children: Not on file  . Years of education: Not on file  . Highest education level: Not on file  Occupational History  . Not on file  Social Needs  . Financial resource strain: Not on file  . Food insecurity    Worry: Not on file    Inability: Not on file  . Transportation needs    Medical: Not on file    Non-medical: Not on file  Tobacco Use  . Smoking status: Light Tobacco Smoker    Packs/day: 0.50  . Smokeless tobacco: Current User  Substance and Sexual Activity  . Alcohol use: Yes  . Drug use: No  . Sexual activity: Not on file    Comment: Sports [employer] @ [employer], Masters degree, married, no regular exercise.  Lifestyle  . Physical activity    Days per week: Not on file    Minutes per  session: Not on file  . Stress: Not on file  Relationships  . Social Musicianconnections    Talks on phone: Not on file    Gets together: Not on file    Attends religious service: Not on file    Active member of club or organization: Not on file    Attends meetings of clubs or organizations: Not on file    Relationship status: Not on file  . Intimate partner violence    Fear of current or ex partner: Not on file    Emotionally abused: Not on file    Physically abused: Not on file    Forced sexual activity: Not on file  Other Topics Concern  . Not on file  Social History Narrative  . Not on file    Outpatient Medications Prior to Visit  Medication Sig Dispense Refill  . albuterol (VENTOLIN HFA) 108 (90 Base) MCG/ACT inhaler INHALE 2 PUFFS INTO THE LUNGS EVERY 6 HOURS AS NEEDED FOR WHEEZING. 18 each 0  . AMBULATORY NON FORMULARY MEDICATION Medication Name: Glucometer with strips and lancets to check daily.  Dx: Diabetes 250.00 1 Units 3  . amLODipine (NORVASC) 5 MG  tablet TAKE 1 TABLET BY MOUTH EVERY DAY 30 tablet 0  . atorvastatin (LIPITOR) 40 MG tablet TAKE 1 TABLET BY MOUTH EVERY DAY 90 tablet 2  . clonazePAM (KLONOPIN) 0.5 MG tablet TAKE 1 TABLET (0.5 MG TOTAL) BY MOUTH DAILY AS NEEDED FOR ANXIETY. THIS IS A 30 DAY SUPPLY 20 tablet 0  . diclofenac sodium (VOLTAREN) 1 % GEL Apply 4 g topically 4 (four) times daily. To affected joint. 100 g 11  . FLUoxetine (PROZAC) 40 MG capsule TAKE 1 CAPSULE BY MOUTH EVERY DAY 90 capsule 1  . Insulin Glargine, 1 Unit Dial, (TOUJEO SOLOSTAR) 300 UNIT/ML SOPN Inject 26 Units into the skin at bedtime. 6 pen 1  . Insulin Pen Needle (B-D ULTRAFINE III SHORT PEN) 31G X 8 MM MISC USE  TO ADMINISTER INSULIN, once daily. Dx E11.8 100 each 11  . JANUMET XR 50-1000 MG TB24 TAKE 1 TABLET BY MOUTH EVERY DAY 30 tablet 5  . losartan-hydrochlorothiazide (HYZAAR) 100-25 MG tablet Take 1 tablet by mouth daily. 90 tablet 1  . sildenafil (VIAGRA) 100 MG tablet Take 1  tablet (100 mg total) by mouth daily as needed for erectile dysfunction. 10 tablet 3  . triamcinolone ointment (KENALOG) 0.5 % Apply 1 application topically 2 (two) times daily. 45 g 0  . zolpidem (AMBIEN) 10 MG tablet TAKE 1 TABLET (10 MG TOTAL) BY MOUTH AT BEDTIME AS NEEDED FOR SLEEP. 30 tablet 1   No facility-administered medications prior to visit.     Allergies  Allergen Reactions  . Gabapentin Other (See Comments)    Drowsiness   . Sulfonamide Derivatives     REACTION: pt. can't remember reaction    ROS Review of Systems    Objective:    Physical Exam  There were no vitals taken for this visit. Wt Readings from Last 3 Encounters:  11/10/18 185 lb (83.9 kg)  10/24/18 191 lb (86.6 kg)  08/29/18 193 lb (87.5 kg)     Health Maintenance Due  Topic Date Due  . HIV Screening  02/23/1984  . OPHTHALMOLOGY EXAM  05/03/2013  . INFLUENZA VACCINE  10/07/2018    There are no preventive care reminders to display for this patient.  Lab Results  Component Value Date   TSH 1.90 01/05/2017   Lab Results  Component Value Date   WBC 8.5 01/05/2017   HGB 15.8 01/05/2017   HCT 47.0 01/05/2017   MCV 85.6 01/05/2017   PLT 229 01/05/2017   Lab Results  Component Value Date   NA 136 11/14/2017   K 5.1 11/14/2017   CO2 30 11/14/2017   GLUCOSE 168 (H) 11/14/2017   BUN 8 11/14/2017   CREATININE 0.99 11/14/2017   BILITOT 0.3 11/14/2017   ALKPHOS 103 04/29/2015   AST 21 11/14/2017   ALT 19 11/14/2017   PROT 6.2 11/14/2017   ALBUMIN 4.2 04/29/2015   CALCIUM 9.1 11/14/2017   Lab Results  Component Value Date   CHOL 113 11/14/2017   Lab Results  Component Value Date   HDL 33 (L) 11/14/2017   Lab Results  Component Value Date   LDLCALC 64 11/14/2017   Lab Results  Component Value Date   TRIG 81 11/14/2017   Lab Results  Component Value Date   CHOLHDL 3.4 11/14/2017   Lab Results  Component Value Date   HGBA1C 6.2 (A) 10/24/2018      Assessment & Plan:    Problem List Items Addressed This Visit      Cardiovascular  and Mediastinum   HYPERTENSION, BENIGN ESSENTIAL - Primary     Endocrine   Type 2 diabetes mellitus with complication (HCC)     Other   Hyperlipemia      No orders of the defined types were placed in this encounter.   Follow-up: No follow-ups on file.    Nani Gasser, MD

## 2019-01-24 ENCOUNTER — Ambulatory Visit: Payer: BC Managed Care – PPO | Admitting: Family Medicine

## 2019-02-11 ENCOUNTER — Other Ambulatory Visit: Payer: Self-pay | Admitting: Family Medicine

## 2019-02-20 ENCOUNTER — Other Ambulatory Visit: Payer: Self-pay | Admitting: Family Medicine

## 2019-02-21 ENCOUNTER — Other Ambulatory Visit: Payer: Self-pay

## 2019-02-21 ENCOUNTER — Ambulatory Visit (INDEPENDENT_AMBULATORY_CARE_PROVIDER_SITE_OTHER): Payer: BLUE CROSS/BLUE SHIELD | Admitting: Family Medicine

## 2019-02-21 ENCOUNTER — Other Ambulatory Visit: Payer: Self-pay | Admitting: *Deleted

## 2019-02-21 VITALS — BP 160/88 | HR 88 | Temp 98.2°F | Wt 192.0 lb

## 2019-02-21 DIAGNOSIS — E7849 Other hyperlipidemia: Secondary | ICD-10-CM | POA: Diagnosis not present

## 2019-02-21 DIAGNOSIS — I10 Essential (primary) hypertension: Secondary | ICD-10-CM | POA: Diagnosis not present

## 2019-02-21 DIAGNOSIS — Z794 Long term (current) use of insulin: Secondary | ICD-10-CM | POA: Diagnosis not present

## 2019-02-21 DIAGNOSIS — Z113 Encounter for screening for infections with a predominantly sexual mode of transmission: Secondary | ICD-10-CM

## 2019-02-21 DIAGNOSIS — E118 Type 2 diabetes mellitus with unspecified complications: Secondary | ICD-10-CM | POA: Diagnosis not present

## 2019-02-21 LAB — POCT GLYCOSYLATED HEMOGLOBIN (HGB A1C): Hemoglobin A1C: 5.9 % — AB (ref 4.0–5.6)

## 2019-02-21 NOTE — Progress Notes (Signed)
Pt in office today for bp check, in office today it was 160/98.Pt states he had a bad day yesterday, BP was high, arm was tingling as if asleep. Pt reports some blurred vision but he needs glasses. Pt was scheduled an appointment with [facility] for tomorrow. Labs were ordered so provider would have to review with pt. HGB a1c was 5.9 today. Pt will need work excuse for today and tomorrow. Urine was also completed and sent to lab.

## 2019-02-22 ENCOUNTER — Telehealth (INDEPENDENT_AMBULATORY_CARE_PROVIDER_SITE_OTHER): Payer: BLUE CROSS/BLUE SHIELD | Admitting: Family Medicine

## 2019-02-22 ENCOUNTER — Encounter: Payer: Self-pay | Admitting: Family Medicine

## 2019-02-22 DIAGNOSIS — R200 Anesthesia of skin: Secondary | ICD-10-CM | POA: Diagnosis not present

## 2019-02-22 DIAGNOSIS — R202 Paresthesia of skin: Secondary | ICD-10-CM

## 2019-02-22 DIAGNOSIS — I10 Essential (primary) hypertension: Secondary | ICD-10-CM | POA: Diagnosis not present

## 2019-02-22 DIAGNOSIS — R519 Headache, unspecified: Secondary | ICD-10-CM

## 2019-02-22 DIAGNOSIS — F439 Reaction to severe stress, unspecified: Secondary | ICD-10-CM

## 2019-02-22 DIAGNOSIS — R52 Pain, unspecified: Secondary | ICD-10-CM

## 2019-02-22 DIAGNOSIS — E118 Type 2 diabetes mellitus with unspecified complications: Secondary | ICD-10-CM

## 2019-02-22 DIAGNOSIS — Z794 Long term (current) use of insulin: Secondary | ICD-10-CM

## 2019-02-22 LAB — COMPLETE METABOLIC PANEL WITH GFR
AG Ratio: 1.9 (calc) (ref 1.0–2.5)
ALT: 29 U/L (ref 9–46)
AST: 37 U/L (ref 10–40)
Albumin: 4.4 g/dL (ref 3.6–5.1)
Alkaline phosphatase (APISO): 78 U/L (ref 36–130)
BUN: 17 mg/dL (ref 7–25)
CO2: 28 mmol/L (ref 20–32)
Calcium: 9.8 mg/dL (ref 8.6–10.3)
Chloride: 101 mmol/L (ref 98–110)
Creat: 0.98 mg/dL (ref 0.60–1.35)
GFR, Est African American: 104 mL/min/{1.73_m2} (ref >=60)
GFR, Est Non African American: 90 mL/min/{1.73_m2} (ref >=60)
Globulin: 2.3 g/dL (calc) (ref 1.9–3.7)
Glucose, Bld: 96 mg/dL (ref 65–139)
Potassium: 4.4 mmol/L (ref 3.5–5.3)
Sodium: 139 mmol/L (ref 135–146)
Total Bilirubin: 0.3 mg/dL (ref 0.2–1.2)
Total Protein: 6.7 g/dL (ref 6.1–8.1)

## 2019-02-22 LAB — TSH: TSH: 2.23 mIU/L (ref 0.40–4.50)

## 2019-02-22 LAB — CBC
HCT: 42.6 % (ref 38.5–50.0)
Hemoglobin: 14.5 g/dL (ref 13.2–17.1)
MCH: 29.8 pg (ref 27.0–33.0)
MCHC: 34 g/dL (ref 32.0–36.0)
MCV: 87.5 fL (ref 80.0–100.0)
MPV: 9.8 fL (ref 7.5–12.5)
Platelets: 282 10*3/uL (ref 140–400)
RBC: 4.87 10*6/uL (ref 4.20–5.80)
RDW: 12.8 % (ref 11.0–15.0)
WBC: 7.9 10*3/uL (ref 3.8–10.8)

## 2019-02-22 LAB — LIPID PANEL W/REFLEX DIRECT LDL
Cholesterol: 127 mg/dL (ref <200)
HDL: 40 mg/dL (ref >=40)
LDL Cholesterol (Calc): 74 mg/dL (calc)
Non-HDL Cholesterol (Calc): 87 mg/dL (calc) (ref <130)
Total CHOL/HDL Ratio: 3.2 (calc) (ref <5.0)
Triglycerides: 54 mg/dL (ref <150)

## 2019-02-22 LAB — C. TRACHOMATIS/N. GONORRHOEAE RNA
C. trachomatis RNA, TMA: NOT DETECTED
N. gonorrhoeae RNA, TMA: NOT DETECTED

## 2019-02-22 NOTE — Progress Notes (Signed)
Virtual Visit via Video Note  I connected with Larry Lawson on 02/22/19 at  1:20 PM EST by a video enabled telemedicine application and verified that I am speaking with the correct person using two identifiers.   I discussed the limitations of evaluation and management by telemedicine and the availability of in person appointments. The patient expressed understanding and agreed to proceed.  Subjective:    CC: Numbness.   HPI: C/O Sleep problems - Usually gets up at 4AM for work. Usually uses a sleep aid but on Monday night fell asleep w/o the aid which he says is highly unusual for him.  When he woke up he  had numbness/tingling in the left axillary area.  Felt numb to touch.  Had some stinging sensation there as well. . Thought maybe slept on his shoulder wrong.  sxs persisted on and off since then.    Yesterday felt a little SOB and had BP checked at work and it was high. WEnt home from work early from work and rechecked BP when got home .  Best was around 160.  He came in for nurse visit yesterday and BP was high. Denies rash. Today has bodyaches and a little SOB.  Has been having HAs for about 4 days.  Has been using Motrin. Glasses broke about a week ago.  Pt reports some blurred vision but he needs glasses.  Bilat Toes bc numb at night when goes to bed.  Worried he is getting neuropathy. New job at [employer], walking on concrete more.  F/U HTN - does drink alcohol daily.  Maybe a couple of beers at night.  Has been taking Motrin as well.   Feels like stress levels are high right now. Tries not to think about it. Ex girlfriend is back in the picture.    Past medical history, Surgical history, Family history not pertinant except as noted below, Social history, Allergies, and medications have been entered into the medical record, reviewed, and corrections made.   Review of Systems: No fevers, chills, night sweats, weight loss, chest pain, or shortness of breath.   Objective:    General:  Speaking clearly in complete sentences without any shortness of breath.  Alert and oriented x3.  Normal judgment. No apparent acute distress.    Impression and Recommendations:   Body aches/headache-being that he does do a job where he is around a lot of other people do think he should get tested for Covid.  Work note provided.  The test results should hopefully come back over the weekend and we can let him know.  Left axillary numbness/tingling-unclear etiology.  He has not noticed a rash so I do not think it is probably shingles.  It sounds like it goes almost down towards his hip so not sure if this is radicular from his spine.  It does not swelling is going down his arm, but he did wake up with it so it does make me wonder if it somewhat positional.  Hopefully will improve through the weekend if not we will try to get him with sports med for further work-up.  Stress/anxiety-currently on fluoxetine.  His ex-girlfriend is back in rehab for meth again.  This has been somewhat stressful for him.  He admits his levels are definitely much higher than usual but did not go into any additional details.  Toe numbness-I like to see this in person so that I could do a monofilament test and evaluate further.  Consider that it could  be peripheral neuropathy either from a deficiency or from nerve damage.  Also could be secondary to diabetes. Thyroid was normal from yesterday. CBC was normal as well. Can check B12, folate, mag, etc.   Diabetes-last A1c yesterday looked absolutely phenomenal.  Hypertension-blood pressure not well controlled when he came in for nurse visit yesterday.   I discussed the assessment and treatment plan with the patient. The patient was provided an opportunity to ask questions and all were answered. The patient agreed with the plan and demonstrated an understanding of the instructions.   The patient was advised to call back or seek an in-person evaluation if the symptoms worsen or  if the condition fails to improve as anticipated.   Nani Gasser, MD

## 2019-02-22 NOTE — Progress Notes (Signed)
Pt's vm is full. 

## 2019-02-22 NOTE — Progress Notes (Signed)
Agree with documentation as above.   Cathryn Gallery, MD  

## 2019-02-24 LAB — NOVEL CORONAVIRUS, NAA: SARS-CoV-2, NAA: NOT DETECTED

## 2019-02-26 NOTE — Progress Notes (Unsigned)
Patient called and needed letter updated for his work. I called him and was not able to leave a message to let him know that the letter was updated.

## 2019-02-27 ENCOUNTER — Other Ambulatory Visit: Payer: Self-pay | Admitting: Family Medicine

## 2019-02-28 ENCOUNTER — Other Ambulatory Visit: Payer: Self-pay | Admitting: Family Medicine

## 2019-03-01 DIAGNOSIS — M791 Myalgia, unspecified site: Secondary | ICD-10-CM | POA: Diagnosis not present

## 2019-03-01 DIAGNOSIS — R6883 Chills (without fever): Secondary | ICD-10-CM | POA: Diagnosis not present

## 2019-03-01 DIAGNOSIS — Z20828 Contact with and (suspected) exposure to other viral communicable diseases: Secondary | ICD-10-CM | POA: Diagnosis not present

## 2019-03-01 DIAGNOSIS — R509 Fever, unspecified: Secondary | ICD-10-CM | POA: Diagnosis not present

## 2019-03-06 ENCOUNTER — Telehealth (INDEPENDENT_AMBULATORY_CARE_PROVIDER_SITE_OTHER): Payer: BLUE CROSS/BLUE SHIELD | Admitting: Family Medicine

## 2019-03-06 DIAGNOSIS — Z91199 Patient's noncompliance with other medical treatment and regimen due to unspecified reason: Secondary | ICD-10-CM

## 2019-03-06 DIAGNOSIS — Z5329 Procedure and treatment not carried out because of patient's decision for other reasons: Secondary | ICD-10-CM

## 2019-03-06 NOTE — Progress Notes (Signed)
Attempted to connect with patient via [place] video visit and through [place].  Patient did not answer either attempt.  He did speak with the nurse prior to the office visit.  Beatrice Lecher, MD

## 2019-03-06 NOTE — Progress Notes (Signed)
Spoke w/patient and he reports that although he is having sxs they are better.   His main concern is about the "Self Swab" that he had to perform for the COVID testing. He questions the number of swabs that come back negative when these are performed. He states that he would have rather had a swab that was done by a clinical person because he's unsure if he did it correctly.  His other concern is why wasn't there a CXR done to see if he could've had PNE or some other lung issues.

## 2019-03-07 MED ORDER — ALBUTEROL SULFATE HFA 108 (90 BASE) MCG/ACT IN AERS: INHALATION_SPRAY | RESPIRATORY_TRACT | 5 refills | Status: DC

## 2019-03-07 NOTE — Addendum Note (Signed)
Addended by: Dessie Coma on: 03/07/2019 03:39 PM   Modules accepted: Orders

## 2019-03-13 ENCOUNTER — Ambulatory Visit (INDEPENDENT_AMBULATORY_CARE_PROVIDER_SITE_OTHER): Payer: BC Managed Care – PPO

## 2019-03-13 ENCOUNTER — Encounter: Payer: Self-pay | Admitting: Physician Assistant

## 2019-03-13 ENCOUNTER — Other Ambulatory Visit: Payer: Self-pay

## 2019-03-13 ENCOUNTER — Ambulatory Visit (INDEPENDENT_AMBULATORY_CARE_PROVIDER_SITE_OTHER): Payer: BC Managed Care – PPO | Admitting: Physician Assistant

## 2019-03-13 VITALS — Temp 101.0°F | Ht 68.0 in | Wt 188.0 lb

## 2019-03-13 DIAGNOSIS — R1084 Generalized abdominal pain: Secondary | ICD-10-CM | POA: Diagnosis not present

## 2019-03-13 DIAGNOSIS — R140 Abdominal distension (gaseous): Secondary | ICD-10-CM

## 2019-03-13 DIAGNOSIS — R82998 Other abnormal findings in urine: Secondary | ICD-10-CM

## 2019-03-13 DIAGNOSIS — R110 Nausea: Secondary | ICD-10-CM | POA: Diagnosis not present

## 2019-03-13 DIAGNOSIS — R17 Unspecified jaundice: Secondary | ICD-10-CM | POA: Diagnosis not present

## 2019-03-13 DIAGNOSIS — E118 Type 2 diabetes mellitus with unspecified complications: Secondary | ICD-10-CM

## 2019-03-13 DIAGNOSIS — R109 Unspecified abdominal pain: Secondary | ICD-10-CM | POA: Diagnosis not present

## 2019-03-13 LAB — COMPLETE METABOLIC PANEL WITH GFR
AG Ratio: 1.1 (calc) (ref 1.0–2.5)
ALT: 590 U/L — ABNORMAL HIGH (ref 9–46)
AST: 118 U/L — ABNORMAL HIGH (ref 10–35)
Albumin: 3.2 g/dL — ABNORMAL LOW (ref 3.6–5.1)
Alkaline phosphatase (APISO): 424 U/L — ABNORMAL HIGH (ref 35–144)
BUN: 10 mg/dL (ref 7–25)
CO2: 31 mmol/L (ref 20–32)
Calcium: 8.5 mg/dL — ABNORMAL LOW (ref 8.6–10.3)
Chloride: 94 mmol/L — ABNORMAL LOW (ref 98–110)
Creat: 0.96 mg/dL (ref 0.70–1.33)
GFR, Est African American: 106 mL/min/{1.73_m2} (ref >=60)
GFR, Est Non African American: 92 mL/min/{1.73_m2} (ref >=60)
Globulin: 2.8 g/dL (calc) (ref 1.9–3.7)
Glucose, Bld: 361 mg/dL — ABNORMAL HIGH (ref 65–99)
Potassium: 4 mmol/L (ref 3.5–5.3)
Sodium: 130 mmol/L — ABNORMAL LOW (ref 135–146)
Total Bilirubin: 7.5 mg/dL — ABNORMAL HIGH (ref 0.2–1.2)
Total Protein: 6 g/dL — ABNORMAL LOW (ref 6.1–8.1)

## 2019-03-13 LAB — URINALYSIS, ROUTINE W REFLEX MICROSCOPIC
Hgb urine dipstick: NEGATIVE
Ketones, ur: NEGATIVE
Leukocytes,Ua: NEGATIVE
Nitrite: NEGATIVE
Protein, ur: NEGATIVE
Specific Gravity, Urine: 1.018 (ref 1.001–1.03)
pH: 6 (ref 5.0–8.0)

## 2019-03-13 LAB — CBC WITH DIFFERENTIAL/PLATELET
Absolute Monocytes: 562 cells/uL (ref 200–950)
Basophils Absolute: 38 cells/uL (ref 0–200)
Basophils Relative: 0.5 %
Eosinophils Absolute: 0 cells/uL — ABNORMAL LOW (ref 15–500)
Eosinophils Relative: 0 %
HCT: 40.1 % (ref 38.5–50.0)
Hemoglobin: 12.9 g/dL — ABNORMAL LOW (ref 13.2–17.1)
Lymphs Abs: 2364 cells/uL (ref 850–3900)
MCH: 29 pg (ref 27.0–33.0)
MCHC: 32.2 g/dL (ref 32.0–36.0)
MCV: 90.1 fL (ref 80.0–100.0)
MPV: 12.1 fL (ref 7.5–12.5)
Monocytes Relative: 7.4 %
Neutro Abs: 4636 cells/uL (ref 1500–7800)
Neutrophils Relative %: 61 %
Platelets: 210 10*3/uL (ref 140–400)
RBC: 4.45 10*6/uL (ref 4.20–5.80)
RDW: 12.9 % (ref 11.0–15.0)
Total Lymphocyte: 31.1 %
WBC: 7.6 10*3/uL (ref 3.8–10.8)

## 2019-03-13 MED ORDER — ONDANSETRON HCL 8 MG PO TABS: 8.0000 mg | ORAL_TABLET | Freq: Three times a day (TID) | ORAL | 0 refills | Status: DC | PRN

## 2019-03-13 NOTE — Progress Notes (Signed)
No concerning signs on CBC. Good news. Waiting for other labs.

## 2019-03-13 NOTE — Progress Notes (Signed)
Patient ID: Larry Lawson, male   DOB: Feb 02, 1971, 49 y.o.   MRN: [id number] .Marland KitchenVirtual Visit via Video Note  I connected with NAASIR Lawson on 03/13/2019 at 11:10 AM EST by a video enabled telemedicine application and verified that I am speaking with the correct person using two identifiers.  Location: Patient: car Provider: clinic   I discussed the limitations of evaluation and management by telemedicine and the availability of in person appointments. The patient expressed understanding and agreed to proceed.  History of Present Illness: Pt is a 49 yo male with HTN, T2DM, HLD who is a current smoker and calls in today with generalized abdominal pain, nausea, bloating, dark urine and yellow tint to eyes and skin. He was just seen on 12/17 virtually for follow up and labs and doing pretty well. Last a1c was 5.9, liver and kidney looked great. He was starting to have some body aches and headaches. He had COVID test done in office and negative. He continued to feel worse so he went to UC where another covid and flu test was done and negative. UC gave him doxycycline and prednisone right before christmas. 3-4 days after starting doxycycline his phelgm started to loosen up and urine started getting darker. He now notice his skin and eyes have a yellow tint. He has notice his abdomen bloated. He does not want to eat and only drinking pinapple juice and gatorade. His sugars are running 250-400 and increasing his toujeo. He had some diarrhea but now stool colors are light brown to white. His is very nauseated but no vomiting. His abdominal pain is worsening. He is running off and on fever. Highest 101.   .. Active Ambulatory Problems    Diagnosis Date Noted  . OBESITY NOS 05/04/2006  . HYPERTENSION, BENIGN ESSENTIAL 05/04/2006  . Depression 03/17/2011  . Tobacco abuse 09/12/2012  . Type 2 diabetes mellitus with complication (HCC) 06/21/2014  . Exposure to hepatitis C 06/25/2014  . Hyperlipemia 04/11/2015   . Lumbar pain with radiation down left leg 04/15/2015  . Trigger thumb of left hand 04/15/2015  . Erectile dysfunction 05/21/2015  . Anxiety 10/24/2018  . Acute hypoxemic respiratory failure (HCC) 03/05/2018   Resolved Ambulatory Problems    Diagnosis Date Noted  . DM (diabetes mellitus) type 2, uncontrolled, with ketoacidosis (HCC) 05/11/2007  . EYE PAIN, LEFT 08/24/2006  . Impaired fasting glucose 05/12/2007  . Other abnormal glucose 05/11/2007  . Pneumonia 03/10/2018   Past Medical History:  Diagnosis Date  . Diabetes mellitus   . Heart murmur   . Heart murmur   . Hypertension    Reviewed med, allergy, problem list.     Observations/Objective: No Acute distress. Normal breathing.  He does appear a little yellow in appearance but I am not aware of his baseline.   .. Today's Vitals   03/13/19 1010  Temp: (!) 101 F (38.3 C)  TempSrc: Oral  Weight: 188 lb (85.3 kg)  Height: 5\' 8"  (1.727 m)   Body mass index is 28.59 kg/m.  .. Results for orders placed or performed in visit on 03/13/19  CBC w/Diff  Result Value Ref Range   WBC 7.6 3.8 - 10.8 Thousand/uL   RBC 4.45 4.20 - 5.80 Million/uL   Hemoglobin 12.9 (L) 13.2 - 17.1 g/dL   HCT 05/11/19 62.9 - 47.6 %   MCV 90.1 80.0 - 100.0 fL   MCH 29.0 27.0 - 33.0 pg   MCHC 32.2 32.0 - 36.0 g/dL  RDW 12.9 11.0 - 15.0 %   Platelets 210 140 - 400 Thousand/uL   MPV 12.1 7.5 - 12.5 fL   Neutro Abs 4,636 1,500 - 7,800 cells/uL   Lymphs Abs 2,364 850 - 3,900 cells/uL   Absolute Monocytes 562 200 - 950 cells/uL   Eosinophils Absolute 0 (L) 15 - 500 cells/uL   Basophils Absolute 38 0 - 200 cells/uL   Neutrophils Relative % 61 %   Total Lymphocyte 31.1 %   Monocytes Relative 7.4 %   Eosinophils Relative 0.0 %   Basophils Relative 0.5 %  COMPLETE METABOLIC PANEL WITH GFR  Result Value Ref Range   Glucose, Bld 361 (H) 65 - 99 mg/dL   BUN 10 7 - 25 mg/dL   Creat 0.96 0.70 - 1.33 mg/dL   GFR, Est Non African American 92 >  OR = 60 mL/min/1.58m2   GFR, Est African American 106 > OR = 60 mL/min/1.62m2   BUN/Creatinine Ratio NOT APPLICABLE 6 - 22 (calc)   Sodium 130 (L) 135 - 146 mmol/L   Potassium 4.0 3.5 - 5.3 mmol/L   Chloride 94 (L) 98 - 110 mmol/L   CO2 31 20 - 32 mmol/L   Calcium 8.5 (L) 8.6 - 10.3 mg/dL   Total Protein 6.0 (L) 6.1 - 8.1 g/dL   Albumin 3.2 (L) 3.6 - 5.1 g/dL   Globulin 2.8 1.9 - 3.7 g/dL (calc)   AG Ratio 1.1 1.0 - 2.5 (calc)   Total Bilirubin 7.5 (H) 0.2 - 1.2 mg/dL   Alkaline phosphatase (APISO) 424 (H) 35 - 144 U/L   AST 118 (H) 10 - 35 U/L   ALT 590 (H) 9 - 46 U/L  Urinalysis, Routine w reflex microscopic  Result Value Ref Range   Color, Urine DARK YELLOW YELLOW   APPearance CLEAR CLEAR   Specific Gravity, Urine 1.018 1.001 - 1.03   pH 6.0 5.0 - 8.0   Glucose, UA 3+ (A) NEGATIVE   Bilirubin Urine  NEGATIVE   Ketones, ur NEGATIVE NEGATIVE   Hgb urine dipstick NEGATIVE NEGATIVE   Protein, ur NEGATIVE NEGATIVE   Nitrite NEGATIVE NEGATIVE   Leukocytes,Ua NEGATIVE NEGATIVE      Assessment and Plan: .Marland KitchenShivan was seen today for nausea and hyperglycemia.  Diagnoses and all orders for this visit:  Generalized abdominal pain -     DG Abd 1 View -     CBC w/Diff -     COMPLETE METABOLIC PANEL WITH GFR -     Urinalysis, Routine w reflex microscopic  Nausea -     ondansetron (ZOFRAN) 8 MG tablet; Take 1 tablet (8 mg total) by mouth every 8 (eight) hours as needed for nausea or vomiting. -     DG Abd 1 View -     CBC w/Diff -     COMPLETE METABOLIC PANEL WITH GFR  Abdominal distention -     DG Abd 1 View -     CBC w/Diff -     COMPLETE METABOLIC PANEL WITH GFR  Dark urine -     Urinalysis, Routine w reflex microscopic  Yellow eyes -     COMPLETE METABOLIC PANEL WITH GFR  Type 2 diabetes mellitus with complication ([place])   Reviewed labs done just 3 weeks ago and very good. Will recheck CBC, CMP today and get UA for dark urine. Xray ordered for abdominal pain.    Unclear etiology of symptoms. Worsening symptoms started after doxycycline and prednisone. He has finished  prednisone. Do not take any more doxycycline. He was never told why on antibiotic either. CXR per patient was normal in UC.   Xray showed no acute findings other than stool in right colon.  No acute findings in urine.  His hgb has dropped from 3 weeks ago. From 14.5 to 12.9.  Glucose is up but likely due to prednisone/drinking a lot of juices. Sodium is down 139 to 130.  Calcium down 9.8 to 8.5.  Liver enzymes have exponentially increased in 3 weeks.   Pt needs further work up. I believe patient should go to ED. I will also discuss with Dr. Linford Arnold PCP and supervising physician her recommendations.        Follow Up Instructions:    I discussed the assessment and treatment plan with the patient. The patient was provided an opportunity to ask questions and all were answered. The patient agreed with the plan and demonstrated an understanding of the instructions.   The patient was advised to call back or seek an in-person evaluation if the symptoms worsen or if the condition fails to improve as anticipated.    Tandy Gaw, PA-C

## 2019-03-13 NOTE — Progress Notes (Deleted)
Symptoms last 3-4 days: Urine dark  Skin yellow Eyes yellow Abdominal swelling and pain/ feels like needs to eat/ feels bloated all day Only drinking gatorade/water Sugars running 300-400 Toujeo - he increased to 29 units (was taking 26)  Hasn't taken blood pressure (driving) but will take when he gets home

## 2019-03-13 NOTE — Progress Notes (Signed)
No acute findings on abdominal xray. You do have a lot of stool in you. Please start miralax 1 capful twice a day until stooling regularly and then can cut back to once a day and then stop.

## 2019-03-14 ENCOUNTER — Other Ambulatory Visit: Payer: Self-pay

## 2019-03-14 ENCOUNTER — Ambulatory Visit (HOSPITAL_COMMUNITY): Payer: BC Managed Care – PPO

## 2019-03-14 ENCOUNTER — Other Ambulatory Visit: Payer: Self-pay | Admitting: Family Medicine

## 2019-03-14 ENCOUNTER — Emergency Department (HOSPITAL_COMMUNITY)
Admission: EM | Admit: 2019-03-14 | Discharge: 2019-03-14 | Disposition: A | Discharge status: Home / Self Care | Payer: BC Managed Care – PPO | Attending: Emergency Medicine | Admitting: Emergency Medicine

## 2019-03-14 ENCOUNTER — Encounter (HOSPITAL_COMMUNITY): Payer: Self-pay | Admitting: *Deleted

## 2019-03-14 ENCOUNTER — Emergency Department (HOSPITAL_COMMUNITY): Payer: BC Managed Care – PPO

## 2019-03-14 DIAGNOSIS — R17 Unspecified jaundice: Secondary | ICD-10-CM | POA: Insufficient documentation

## 2019-03-14 DIAGNOSIS — Z79899 Other long term (current) drug therapy: Secondary | ICD-10-CM | POA: Diagnosis not present

## 2019-03-14 DIAGNOSIS — F17210 Nicotine dependence, cigarettes, uncomplicated: Secondary | ICD-10-CM | POA: Insufficient documentation

## 2019-03-14 DIAGNOSIS — F17228 Nicotine dependence, chewing tobacco, with other nicotine-induced disorders: Secondary | ICD-10-CM | POA: Insufficient documentation

## 2019-03-14 DIAGNOSIS — Z794 Long term (current) use of insulin: Secondary | ICD-10-CM | POA: Diagnosis not present

## 2019-03-14 DIAGNOSIS — R1011 Right upper quadrant pain: Secondary | ICD-10-CM | POA: Diagnosis not present

## 2019-03-14 DIAGNOSIS — E1165 Type 2 diabetes mellitus with hyperglycemia: Secondary | ICD-10-CM | POA: Diagnosis not present

## 2019-03-14 DIAGNOSIS — B159 Hepatitis A without hepatic coma: Secondary | ICD-10-CM | POA: Diagnosis not present

## 2019-03-14 DIAGNOSIS — I10 Essential (primary) hypertension: Secondary | ICD-10-CM | POA: Diagnosis not present

## 2019-03-14 DIAGNOSIS — R140 Abdominal distension (gaseous): Secondary | ICD-10-CM | POA: Insufficient documentation

## 2019-03-14 DIAGNOSIS — R1010 Upper abdominal pain, unspecified: Secondary | ICD-10-CM | POA: Diagnosis not present

## 2019-03-14 DIAGNOSIS — R739 Hyperglycemia, unspecified: Secondary | ICD-10-CM

## 2019-03-14 LAB — LACTIC ACID, PLASMA: Lactic Acid, Venous: 2 mmol/L (ref 0.5–1.9)

## 2019-03-14 LAB — CBC
HCT: 37.9 % — ABNORMAL LOW (ref 39.0–52.0)
Hemoglobin: 13.1 g/dL (ref 13.0–17.0)
MCH: 29.7 pg (ref 26.0–34.0)
MCHC: 34.6 g/dL (ref 30.0–36.0)
MCV: 85.9 fL (ref 80.0–100.0)
Platelets: 192 10*3/uL (ref 150–400)
RBC: 4.41 MIL/uL (ref 4.22–5.81)
RDW: 14.6 % (ref 11.5–15.5)
WBC: 7.3 10*3/uL (ref 4.0–10.5)
nRBC: 0 % (ref 0.0–0.2)

## 2019-03-14 LAB — COMPREHENSIVE METABOLIC PANEL
ALT: 479 U/L — ABNORMAL HIGH (ref 0–44)
AST: 111 U/L — ABNORMAL HIGH (ref 15–41)
Albumin: 2.5 g/dL — ABNORMAL LOW (ref 3.5–5.0)
Alkaline Phosphatase: 436 U/L — ABNORMAL HIGH (ref 38–126)
Anion gap: 7 (ref 5–15)
BUN: 11 mg/dL (ref 6–20)
CO2: 28 mmol/L (ref 22–32)
Calcium: 8.5 mg/dL — ABNORMAL LOW (ref 8.9–10.3)
Chloride: 92 mmol/L — ABNORMAL LOW (ref 98–111)
Creatinine, Ser: 1.12 mg/dL (ref 0.61–1.24)
GFR calc Af Amer: >60 mL/min (ref >60)
GFR calc non Af Amer: >60 mL/min (ref >60)
Glucose, Bld: 556 mg/dL (ref 70–99)
Potassium: 4.7 mmol/L (ref 3.5–5.1)
Sodium: 127 mmol/L — ABNORMAL LOW (ref 135–145)
Total Bilirubin: 6 mg/dL — ABNORMAL HIGH (ref 0.3–1.2)
Total Protein: 6.5 g/dL (ref 6.5–8.1)

## 2019-03-14 LAB — URINALYSIS, ROUTINE W REFLEX MICROSCOPIC
Bacteria, UA: NONE SEEN
Bilirubin Urine: NEGATIVE
Glucose, UA: >=500 mg/dL — AB
Hgb urine dipstick: NEGATIVE
Ketones, ur: NEGATIVE mg/dL
Leukocytes,Ua: NEGATIVE
Nitrite: NEGATIVE
Protein, ur: NEGATIVE mg/dL
Specific Gravity, Urine: 1.012 (ref 1.005–1.030)
pH: 6 (ref 5.0–8.0)

## 2019-03-14 LAB — PROTIME-INR
INR: 1 (ref 0.8–1.2)
INR: 1 (ref 0.8–1.2)
Prothrombin Time: 13.2 seconds (ref 11.4–15.2)
Prothrombin Time: 12.9 seconds (ref 11.4–15.2)

## 2019-03-14 LAB — HEPATIC FUNCTION PANEL
ALT: 486 U/L — ABNORMAL HIGH (ref 0–44)
AST: 114 U/L — ABNORMAL HIGH (ref 15–41)
Albumin: 2.5 g/dL — ABNORMAL LOW (ref 3.5–5.0)
Alkaline Phosphatase: 454 U/L — ABNORMAL HIGH (ref 38–126)
Bilirubin, Direct: 3.3 mg/dL — ABNORMAL HIGH (ref 0.0–0.2)
Indirect Bilirubin: 2.7 mg/dL — ABNORMAL HIGH (ref 0.3–0.9)
Total Bilirubin: 6 mg/dL — ABNORMAL HIGH (ref 0.3–1.2)
Total Protein: 6.4 g/dL — ABNORMAL LOW (ref 6.5–8.1)

## 2019-03-14 LAB — ETHANOL: Alcohol, Ethyl (B): <10 mg/dL (ref <10)

## 2019-03-14 LAB — GAMMA GT: GGT: 384 U/L — ABNORMAL HIGH (ref 7–50)

## 2019-03-14 LAB — CBG MONITORING, ED: Glucose-Capillary: 266 mg/dL — ABNORMAL HIGH (ref 70–99)

## 2019-03-14 LAB — LIPASE, BLOOD: Lipase: 62 U/L — ABNORMAL HIGH (ref 11–51)

## 2019-03-14 LAB — ACETAMINOPHEN LEVEL: Acetaminophen (Tylenol), Serum: <10 ug/mL — ABNORMAL LOW (ref 10–30)

## 2019-03-14 MED ORDER — SODIUM CHLORIDE 0.9 % IV BOLUS
1000.0000 mL | Freq: Once | INTRAVENOUS | Status: AC
  Administered 2019-03-14: 15:00:00 1000 mL via INTRAVENOUS

## 2019-03-14 MED ORDER — SODIUM CHLORIDE 0.9 % IV BOLUS
1000.0000 mL | Freq: Once | INTRAVENOUS | Status: AC
  Administered 2019-03-14: 1000 mL via INTRAVENOUS

## 2019-03-14 MED ORDER — SODIUM CHLORIDE 0.9% FLUSH: 3.0000 mL | Freq: Once | INTRAVENOUS | Status: DC

## 2019-03-14 MED ORDER — INSULIN ASPART 100 UNIT/ML ~~LOC~~ SOLN
10.0000 [IU] | Freq: Once | SUBCUTANEOUS | Status: AC
  Administered 2019-03-14: 10 [IU] via SUBCUTANEOUS

## 2019-03-14 NOTE — ED Notes (Signed)
Patient verbalizes understanding of discharge instructions. Opportunity for questioning and answers were provided. Armband removed by staff, pt discharged from ED.  

## 2019-03-14 NOTE — Discharge Instructions (Signed)
Please read and follow all provided instructions.  Your diagnoses today include:  1. Viral hepatitis A without hepatic coma   2. RUQ pain   3. Hyperglycemia      Tests performed today include:  Vital signs. See below for your results today.   Blood counts and electrolytes - shows high blood sugar  Liver function tests - are elevated with high bilirubin  Medications prescribed:   None  Take any prescribed medications only as directed.  Home care instructions:  Follow any educational materials contained in this packet.  Avoid alcohol and Tylenol.   Follow-up instructions: Please follow-up with your primary care provider in the next 5 days for further evaluation of your symptoms.    Return instructions:   Please return to the Emergency Department if you experience worsening symptoms.   Return with uncontrolled pain, persistent vomiting.  Please return if you have any other emergent concerns.  Additional Information:  Your vital signs today were: BP 132/80 (BP Location: Right Arm)   Pulse 78   Temp 98 F (36.7 C) (Oral)   Resp 16   SpO2 97%  If your blood pressure (BP) was elevated above 135/85 this visit, please have this repeated by your doctor within one month. --------------

## 2019-03-14 NOTE — ED Triage Notes (Signed)
To ED for eval of abd pain, liver testing, and jaundice - per pt. States he feels better today than he did yesterday. No vomiting today. Sclera is yellow.

## 2019-03-14 NOTE — Progress Notes (Signed)
Larry Lawson,   Hemoglobin dropped from 3 weeks ago to 12.9 from 14.5.  Blood sugar was 361 likely due to drinking more juice and prednisone.  Liver enzymes, sodium, calcium have all changed drastically. My suggestion is to go to ED. You need more work up. If you belly is too distended if it is fluid may need to be drained. You need regular labs to make sure liver is not continuing to worsen.   I will discuss with Dr. Linford Arnold when she gets here in am as well.

## 2019-03-14 NOTE — ED Provider Notes (Signed)
[facility] EMERGENCY DEPARTMENT Provider Note   CSN: [id number] Arrival date & time: 03/14/19  1040     History Chief Complaint  Patient presents with  . Abdominal Pain  . Jaundice    Larry Lawson is a 49 y.o. male.  Patient with history of diabetes maintained on oral antihyperglycemics, hypertension --presents to the emergency department with complaint of abdominal pain and bloating, abnormal liver function tests, jaundice.  Symptoms started several days ago.  Patient had video visit for vague symptoms, body aches, lightheadedness around 12/17.  Reportedly drinking alcohol daily at that time per PCP note.  He was tested for Covid which was reportedly negative.  He was given doxycycline and prednisone (around Xmas) for treatment of his symptoms (I presume this was treatment for bronchitis).  Patient has since had intermittent fevers to 101 F, abdominal pain that is worse in the upper abdomen, as well as abdominal distention.  He has noted that his urine has been dark and his skin has been turning yellow.  Patient has been taking Motrin for symptoms and denies Tylenol use.  He denies drinking alcohol daily to me today.  He denies any IV drugs or substance abuse.  He denies any supplements, over-the-counter or otherwise.  No new prescription medications other than recent doxy/prednisone.  Patient states that he has been tested for hepatitis several times (hep C expossure and has been negative -- although he is requesting hepatitis testing given that his girlfriend is a recovered substance user.  Currently no vomiting or diarrhea.  No dysuria.  Onset of symptoms gradual.  Course is worsening.  Nothing makes symptoms better or worse.        Past Medical History:  Diagnosis Date  . Diabetes mellitus   . Heart murmur   . Heart murmur   . Hypertension     Patient Active Problem List   Diagnosis Date Noted  . Anxiety 10/24/2018  . Acute hypoxemic respiratory failure  (HCC) 03/05/2018  . Erectile dysfunction 05/21/2015  . Lumbar pain with radiation down left leg 04/15/2015  . Trigger thumb of left hand 04/15/2015  . Hyperlipemia 04/11/2015  . Exposure to hepatitis C 06/25/2014  . Type 2 diabetes mellitus with complication (HCC) 06/21/2014  . Tobacco abuse 09/12/2012  . Depression 03/17/2011  . OBESITY NOS 05/04/2006  . HYPERTENSION, BENIGN ESSENTIAL 05/04/2006    History reviewed. No pertinent surgical history.     Family History  Problem Relation Age of Onset  . Hyperlipidemia Mother   . Heart failure Mother   . Hypertension Father   . Prostate cancer Father   . Cancer Other        grandmother  . Diabetes Brother   . Heart attack Other        grandfather  . Heart attack Brother 46  . Heart failure Brother   . Heart disease Paternal Uncle     Social History   Tobacco Use  . Smoking status: Light Tobacco Smoker    Packs/day: 0.50  . Smokeless tobacco: Current User  Substance Use Topics  . Alcohol use: Yes  . Drug use: No    Home Medications Prior to Admission medications   Medication Sig Start Date End Date Taking? Authorizing Provider  albuterol (VENTOLIN HFA) 108 (90 Base) MCG/ACT inhaler INHALE 2 PUFFS INTO THE LUNGS EVERY 6 HOURS AS NEEDED FOR WHEEZING. 03/07/19   Agapito Games, MD  AMBULATORY NON FORMULARY MEDICATION Medication Name: Glucometer with strips and lancets  to check daily.  Dx: Diabetes 250.00 06/21/14   Breeback, Jade L, PA-C  amLODipine (NORVASC) 5 MG tablet TAKE 1 TABLET BY MOUTH EVERY DAY 02/12/19   Agapito Games, MD  atorvastatin (LIPITOR) 40 MG tablet TAKE 1 TABLET BY MOUTH EVERY DAY 12/27/18   Agapito Games, MD  clonazePAM (KLONOPIN) 0.5 MG tablet TAKE 1 TABLET (0.5 MG TOTAL) BY MOUTH DAILY AS NEEDED FOR ANXIETY. THIS IS A 30 DAY SUPPLY 03/05/19   Agapito Games, MD  FLUoxetine (PROZAC) 40 MG capsule TAKE 1 CAPSULE BY MOUTH EVERY DAY 01/04/19   Agapito Games, MD    Insulin Pen Needle (B-D ULTRAFINE III SHORT PEN) 31G X 8 MM MISC USE  TO ADMINISTER INSULIN, once daily. Dx E11.8 07/06/18   Agapito Games, MD  JANUMET XR 50-1000 MG TB24 TAKE 1 TABLET BY MOUTH EVERY DAY 02/27/19   Agapito Games, MD  losartan-hydrochlorothiazide (HYZAAR) 100-25 MG tablet Take 1 tablet by mouth daily. 10/24/18   Agapito Games, MD  ondansetron (ZOFRAN) 8 MG tablet Take 1 tablet (8 mg total) by mouth every 8 (eight) hours as needed for nausea or vomiting. 03/13/19   Breeback, Jade L, PA-C  sildenafil (REVATIO) 20 MG tablet Take 40-100 mg by mouth daily as needed. 02/19/19   [provider]  sildenafil (VIAGRA) 100 MG tablet Take 1 tablet (100 mg total) by mouth daily as needed for erectile dysfunction. 03/24/18   Agapito Games, MD  TOUJEO SOLOSTAR 300 UNIT/ML SOPN INJECT 36 UNITS INTO THE SKIN AT BEDTIME. 02/21/19   Agapito Games, MD  zolpidem (AMBIEN) 10 MG tablet TAKE 1 TABLET BY MOUTH AT BEDTIME AS NEEDED FOR SLEEP. 02/21/19   Agapito Games, MD    Allergies    Gabapentin and Sulfonamide derivatives  Review of Systems   Review of Systems  Constitutional: Positive for appetite change, chills and fever.  HENT: Negative for rhinorrhea and sore throat.   Eyes: Negative for redness.  Respiratory: Negative for cough.   Cardiovascular: Negative for chest pain.  Gastrointestinal: Positive for abdominal pain and nausea. Negative for diarrhea and vomiting.  Genitourinary: Negative for dysuria.  Musculoskeletal: Positive for myalgias.  Skin: Positive for color change. Negative for rash.  Neurological: Positive for headaches.    Physical Exam Updated Vital Signs BP 132/80 (BP Location: Right Arm)   Pulse 85   Temp 98 F (36.7 C) (Oral)   Resp 16   SpO2 98%   Physical Exam Vitals and nursing note reviewed.  Constitutional:      Appearance: He is well-developed.  HENT:     Head: Normocephalic and atraumatic.  Eyes:      General: Scleral icterus (mild) present.        Right eye: No discharge.        Left eye: No discharge.     Conjunctiva/sclera: Conjunctivae normal.  Cardiovascular:     Rate and Rhythm: Normal rate and regular rhythm.     Heart sounds: Normal heart sounds.  Pulmonary:     Effort: Pulmonary effort is normal.     Breath sounds: Normal breath sounds.  Abdominal:     Palpations: Abdomen is soft.     Tenderness: There is abdominal tenderness (winces with deep palpation RUQ) in the right upper quadrant, epigastric area and left upper quadrant.     Hernia: No hernia is present.  Musculoskeletal:     Cervical back: Normal range of motion and neck supple.  Skin:    General: Skin is warm and dry.     Coloration: Skin is jaundiced (mild).  Neurological:     General: No focal deficit present.     Mental Status: He is alert and oriented to person, place, and time.     ED Results / Procedures / Treatments   Labs (all labs ordered are listed, but only abnormal results are displayed) Labs Reviewed  LIPASE, BLOOD - Abnormal; Notable for the following components:      Result Value   Lipase 62 (*)    All other components within normal limits  COMPREHENSIVE METABOLIC PANEL - Abnormal; Notable for the following components:   Sodium 127 (*)    Chloride 92 (*)    Glucose, Bld 556 (*)    Calcium 8.5 (*)    Albumin 2.5 (*)    AST 111 (*)    ALT 479 (*)    Alkaline Phosphatase 436 (*)    Total Bilirubin 6.0 (*)    All other components within normal limits  CBC - Abnormal; Notable for the following components:   HCT 37.9 (*)    All other components within normal limits  URINALYSIS, ROUTINE W REFLEX MICROSCOPIC - Abnormal; Notable for the following components:   Glucose, UA >=500 (*)    All other components within normal limits  HEPATIC FUNCTION PANEL - Abnormal; Notable for the following components:   Total Protein 6.4 (*)    Albumin 2.5 (*)    AST 114 (*)    ALT 486 (*)    Alkaline  Phosphatase 454 (*)    Total Bilirubin 6.0 (*)    Bilirubin, Direct 3.3 (*)    Indirect Bilirubin 2.7 (*)    All other components within normal limits  LACTIC ACID, PLASMA - Abnormal; Notable for the following components:   Lactic Acid, Venous 2.0 (*)    All other components within normal limits  HEPATITIS PANEL, ACUTE - Abnormal; Notable for the following components:   Hep A IgM Reactive (*)    All other components within normal limits  ACETAMINOPHEN LEVEL - Abnormal; Notable for the following components:   Acetaminophen (Tylenol), Serum <10 (*)    All other components within normal limits  GAMMA GT - Abnormal; Notable for the following components:   GGT 384 (*)    All other components within normal limits  CBG MONITORING, ED - Abnormal; Notable for the following components:   Glucose-Capillary 266 (*)    All other components within normal limits  CULTURE, BLOOD (ROUTINE X 2)  CULTURE, BLOOD (ROUTINE X 2)  PROTIME-INR  PROTIME-INR  ETHANOL  LACTIC ACID, PLASMA  LACTATE DEHYDROGENASE    EKG None  Radiology DG Abd 1 View  Result Date: 03/13/2019 CLINICAL DATA:  Abdominal pain for 3 days, nausea, bloating, diabetes mellitus, hypertension EXAM: ABDOMEN - 1 VIEW COMPARISON:  None FINDINGS: Slightly prominent stool in RIGHT colon. Nonobstructive bowel gas pattern. No bowel dilatation or bowel wall thickening. Mild degenerative disc disease changes lumbar spine. No acute osseous findings or urinary tract calcifications identified. IMPRESSION: Minimally prominent stool RIGHT colon, otherwise negative exam. Electronically Signed   By: Ulyses Southward M.D.   On: 03/13/2019 12:51    Procedures Procedures (including critical care time)  Medications Ordered in ED Medications  sodium chloride flush (NS) 0.9 % injection 3 mL (has no administration in time range)  sodium chloride 0.9 % bolus 1,000 mL (0 mLs Intravenous Stopped 03/14/19 1542)  sodium chloride 0.9 %  bolus 1,000 mL (1,000 mLs  Intravenous New Bag/Given 03/14/19 1448)  insulin aspart (novoLOG) injection 10 Units (10 Units Subcutaneous Given 03/14/19 1448)    ED Course  I have reviewed the triage vital signs and the nursing notes.  Pertinent labs & imaging results that were available during my care of the patient were reviewed by me and considered in my medical decision making (see chart for details).  Patient seen and examined. Work-up initiated.  Reviewed recent PCP notes.  Reviewed labs from yesterday.  No fever currently.  Patient appears nontoxic.  Ultrasound ordered to evaluate right upper quadrant.    Vital signs reviewed and are as follows: BP 132/80 (BP Location: Right Arm)   Pulse 85   Temp 98 F (36.7 C) (Oral)   Resp 16   SpO2 98%   3:43 PM work-up is significant for hepatitis a.  Ultrasound does not show any evidence of biliary dilation.  Discussed lab results and work-up with Nancy Marus, PA-C with GI.  She agrees that if patient sugars are controlled, he can likely be discharged home.  Encouraged PCP follow-up to ensure that labs continue to improve.   Discussed with plan with Dr. Kathrynn Humble.  Patient updated on results.  Sugars improved.  Plan for discharged home with continued home hydration.  Patient verbalizes understanding agrees with plan.  Discussed avoidance of Tylenol, alcohol.  Patient is in longer taking doxycycline.  The patient was urged to return to the Emergency Department immediately with worsening of current symptoms, worsening abdominal pain, persistent vomiting, blood noted in stools, fever, or any other concerns. The patient verbalized understanding.     MDM Rules/Calculators/A&P                      Patient with right upper quadrant abdominal pain, intermittent fevers, elevated liver enzymes and bilirubin.  Work-up demonstrates hepatitis A today.  Otherwise no signs of obstruction.  No signs of sepsis or severe infection.  Patient with elevated blood sugars.  Normal anion gap  without signs of DKA.  Patient being hydrated.  Anticipate discharge to home with symptomatic management when sugars improve.   Final Clinical Impression(s) / ED Diagnoses Final diagnoses:  RUQ pain  Viral hepatitis A without hepatic coma  Hyperglycemia    Rx / DC Orders ED Discharge Orders    None       Carlisle Cater, PA-C 03/14/19 [place], [place], MD 03/19/19 1755

## 2019-03-15 ENCOUNTER — Telehealth: Payer: Self-pay

## 2019-03-15 LAB — HEPATITIS PANEL, ACUTE
HCV Ab: NONREACTIVE
Hep A IgM: REACTIVE — AB
Hep B C IgM: NONREACTIVE
Hepatitis B Surface Ag: NONREACTIVE

## 2019-03-15 NOTE — Telephone Encounter (Signed)
Larry Lawson called the office and left a message. He stated he was in the hospital and needs a hospital follow up on 03/21/2019. He demands an in office visit. Please advise if an in office visit is appropriate.

## 2019-03-15 NOTE — Telephone Encounter (Signed)
Patient scheduled.

## 2019-03-15 NOTE — Telephone Encounter (Signed)
Yes, okay for in person.  He was diagnosed with acute hepatitis.

## 2019-03-18 ENCOUNTER — Other Ambulatory Visit: Payer: Self-pay | Admitting: Family Medicine

## 2019-03-19 LAB — CULTURE, BLOOD (ROUTINE X 2)
Culture: NO GROWTH
Culture: NO GROWTH
Special Requests: ADEQUATE

## 2019-03-20 ENCOUNTER — Encounter: Payer: Self-pay | Admitting: Family Medicine

## 2019-03-20 ENCOUNTER — Ambulatory Visit (INDEPENDENT_AMBULATORY_CARE_PROVIDER_SITE_OTHER): Payer: BC Managed Care – PPO | Admitting: Family Medicine

## 2019-03-20 VITALS — BP 158/82 | HR 74 | Ht 68.0 in | Wt 184.0 lb

## 2019-03-20 DIAGNOSIS — E118 Type 2 diabetes mellitus with unspecified complications: Secondary | ICD-10-CM

## 2019-03-20 DIAGNOSIS — I10 Essential (primary) hypertension: Secondary | ICD-10-CM

## 2019-03-20 DIAGNOSIS — B159 Hepatitis A without hepatic coma: Secondary | ICD-10-CM

## 2019-03-20 NOTE — Patient Instructions (Signed)
Increase Toujeo to 34 units nightly for 2 nights.  Continue to increase by 2 units every 2 days until fasting blood sugar is under 120.  Want to get to that dose then just stay on it and please call us if your blood sugar start to drop below 80.

## 2019-03-20 NOTE — Assessment & Plan Note (Signed)
He is significantly improved but still having a little bit of discomfort on exam as well some decreased appetite fatigue and persistent jaundice.  We will recheck enzymes today.  Like to see him back in a month to make sure he is continuing to improve.  Blood pressure was not well controlled today but will follow again I see him back in a month.

## 2019-03-20 NOTE — Assessment & Plan Note (Signed)
Uncontrolled today though he reports his home blood pressures are well controlled.  We will continue to monitor and plan to recheck in 4 weeks.

## 2019-03-20 NOTE — Assessment & Plan Note (Signed)
Uncontrolled.  Increase Toujeo to 34 units nightly for 2 nights.  Continue to increase by 2 units every 2 days until fasting blood sugar is under 120.  Want to get to that dose then just stay on it and please call us if your blood sugar start to drop below 80.

## 2019-03-20 NOTE — Progress Notes (Signed)
Established Patient Office Visit  Subjective:  Patient ID: Larry Lawson, male    DOB: [birth year]  Age: 49 y.o. MRN: [id number]  CC:  Chief Complaint  Patient presents with  . Hospitalization Follow-up    HPI Larry Lawson presents for hospital follow-up.  He was seen for abdominal pain and jaundice on January 5.  His liver enzymes were extremely elevated in the 400s glucose was greater than 500 and so he was sent to the emergency department for further evaluation work-up.  His bilirubin was significantly elevated at 7-1/2.  He was diagnosed with acute hepatitis A.  He is actually feeling much better today.  Still just a little bit tired still noticing some jaundice.  Still little bit of discomfort in the epigastric area but overall much better.  As of diarrhea he is now feeling a little bit more constipated so actually started MiraLAX about 2 days ago that seems to be softening his stools.  He still struggling with having having really elevated blood glucose levels but he also admits he has been not really watching what he eats.  He just has not had much of an appetite so just will snack when he can and is not always making the best choices.  He says his blood sugar this morning was in the 400s but then yesterday morning it was in the 200s.  It was greater than 500 upon admission but it was 266 after discharge.  He is currently using 26 units of Lantus.  Past Medical History:  Diagnosis Date  . Diabetes mellitus   . Heart murmur   . Heart murmur   . Hypertension     No past surgical history on file.  Family History  Problem Relation Age of Onset  . Hyperlipidemia Mother   . Heart failure Mother   . Hypertension Father   . Prostate cancer Father   . Cancer Other        grandmother  . Diabetes Brother   . Heart attack Other        grandfather  . Heart attack Brother 46  . Heart failure Brother   . Heart disease Paternal Uncle     Social History   Socioeconomic History   . Marital status: Married    Spouse name: Not on file  . Number of children: Not on file  . Years of education: Not on file  . Highest education level: Not on file  Occupational History  . Not on file  Tobacco Use  . Smoking status: Light Tobacco Smoker    Packs/day: 0.50  . Smokeless tobacco: Current User  Substance and Sexual Activity  . Alcohol use: Yes  . Drug use: No  . 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  Other Topics Concern  . Not on file  Social History Narrative  . Not on file   Social Determinants of Health   Financial Resource Strain:   . Difficulty of Paying Living Expenses: Not on file  Food Insecurity:   . Worried About Programme researcher, broadcasting/film/video in the Last Year: Not on file  . Ran Out of Food in the Last Year: Not on file  Transportation Needs:   . Lack of Transportation (Medical): Not on file  . Lack of Transportation (Non-Medical): Not on file  Physical Activity:   . Days of Exercise per Week: Not on file  . Minutes of  Exercise per Session: Not on file  Stress:   . Feeling of Stress : Not on file  Social Connections:   . Frequency of Communication with Friends and Family: Not on file  . Frequency of Social Gatherings with Friends and Family: Not on file  . Attends Religious Services: Not on file  . Active Member of Clubs or Organizations: Not on file  . Attends Banker Meetings: Not on file  . Marital Status: Not on file  Intimate Partner Violence:   . Fear of Current or Ex-Partner: Not on file  . Emotionally Abused: Not on file  . Physically Abused: Not on file  . Sexually Abused: Not on file    Outpatient Medications Prior to Visit  Medication Sig Dispense Refill  . albuterol (VENTOLIN HFA) 108 (90 Base) MCG/ACT inhaler INHALE 2 PUFFS INTO THE LUNGS EVERY 6 HOURS AS NEEDED FOR WHEEZING. (Patient taking differently: Inhale 2 puffs into the lungs every  6 (six) hours as needed for wheezing or shortness of breath. INHALE 2 PUFFS INTO THE LUNGS EVERY 6 HOURS AS NEEDED FOR WHEEZING.) 18 g 5  . AMBULATORY NON FORMULARY MEDICATION Medication Name: Glucometer with strips and lancets to check daily.  Dx: Diabetes 250.00 1 Units 3  . amLODipine (NORVASC) 5 MG tablet TAKE 1 TABLET BY MOUTH EVERY DAY 30 tablet 3  . atorvastatin (LIPITOR) 40 MG tablet TAKE 1 TABLET BY MOUTH EVERY DAY (Patient taking differently: Take 40 mg by mouth daily at 6 PM. ) 90 tablet 2  . clonazePAM (KLONOPIN) 0.5 MG tablet TAKE 1 TABLET (0.5 MG TOTAL) BY MOUTH DAILY AS NEEDED FOR ANXIETY. THIS IS A 30 DAY SUPPLY 20 tablet 0  . FLUoxetine (PROZAC) 40 MG capsule TAKE 1 CAPSULE BY MOUTH EVERY DAY (Patient taking differently: Take 40 mg by mouth daily. ) 90 capsule 1  . ibuprofen (ADVIL) 200 MG tablet Take 400 mg by mouth 2 (two) times daily as needed for moderate pain.    . Insulin Pen Needle (B-D ULTRAFINE III SHORT PEN) 31G X 8 MM MISC USE  TO ADMINISTER INSULIN, once daily. Dx E11.8 100 each 11  . JANUMET XR 50-1000 MG TB24 TAKE 1 TABLET BY MOUTH EVERY DAY 30 tablet 5  . losartan-hydrochlorothiazide (HYZAAR) 100-25 MG tablet Take 1 tablet by mouth daily. 90 tablet 1  . sildenafil (REVATIO) 20 MG tablet TAKE 2 TO 5 TABLETS BY MOUTH ONCE DAILY AS NEEDED 50 tablet 0  . TOUJEO SOLOSTAR 300 UNIT/ML SOPN INJECT 36 UNITS INTO THE SKIN AT BEDTIME. (Patient taking differently: Inject 26 Units into the skin at bedtime. ) 13.5 mL 1  . zolpidem (AMBIEN) 10 MG tablet TAKE 1 TABLET BY MOUTH AT BEDTIME AS NEEDED FOR SLEEP. (Patient taking differently: Take 10 mg by mouth at bedtime as needed for sleep. ) 30 tablet 0  . benzonatate (TESSALON) 200 MG capsule Take 200 mg by mouth 3 (three) times daily as needed for cough.     . ondansetron (ZOFRAN) 8 MG tablet Take 1 tablet (8 mg total) by mouth every 8 (eight) hours as needed for nausea or vomiting. 20 tablet 0  . sildenafil (VIAGRA) 100 MG tablet  Take 1 tablet (100 mg total) by mouth daily as needed for erectile dysfunction. (Patient not taking: Reported on 03/14/2019) 10 tablet 3   No facility-administered medications prior to visit.    Allergies  Allergen Reactions  . Gabapentin Other (See Comments)    Drowsiness   . Sulfonamide  Derivatives     REACTION: pt. can't remember reaction    ROS Review of Systems    Objective:    Physical Exam  Constitutional: He is oriented to person, place, and time. He appears well-developed and well-nourished.  HENT:  Head: Normocephalic and atraumatic.  Right Ear: External ear normal.  Left Ear: External ear normal.  Nose: Nose normal.  Mouth/Throat: Oropharynx is clear and moist.  TMs and canals are clear.   Eyes: Pupils are equal, round, and reactive to light. Conjunctivae and EOM are normal.  Neck: No thyromegaly present.  Cardiovascular: Normal rate and normal heart sounds.  Pulmonary/Chest: Effort normal and breath sounds normal.  Abdominal: Soft. Bowel sounds are normal. He exhibits no distension and no mass. There is abdominal tenderness. There is no rebound and no guarding.  Mild tenderness in the right and left upper quadrants.  Musculoskeletal:     Cervical back: Neck supple.  Lymphadenopathy:    He has no cervical adenopathy.  Neurological: He is alert and oriented to person, place, and time.  Skin: Skin is warm and dry.  + jaundice  Psychiatric: He has a normal mood and affect. His behavior is normal.    BP (!) 158/82   Pulse 74   Ht 5\' 8"  (1.727 m)   Wt 184 lb (83.5 kg)   SpO2 100%   BMI 27.98 kg/m  Wt Readings from Last 3 Encounters:  03/20/19 184 lb (83.5 kg)  03/13/19 188 lb (85.3 kg)  02/21/19 192 lb (87.1 kg)     Health Maintenance Due  Topic Date Due  . HIV Screening  02/23/1984  . OPHTHALMOLOGY EXAM  05/03/2013  . COLONOSCOPY  02/23/2019    There are no preventive care reminders to display for this patient.  Lab Results  Component Value  Date   TSH 2.23 02/21/2019   Lab Results  Component Value Date   WBC 7.3 03/14/2019   HGB 13.1 03/14/2019   HCT 37.9 (L) 03/14/2019   MCV 85.9 03/14/2019   PLT 192 03/14/2019   Lab Results  Component Value Date   NA 127 (L) 03/14/2019   K 4.7 03/14/2019   CO2 28 03/14/2019   GLUCOSE 556 (HH) 03/14/2019   BUN 11 03/14/2019   CREATININE 1.12 03/14/2019   BILITOT 6.0 (H) 03/14/2019   ALKPHOS 454 (H) 03/14/2019   AST 114 (H) 03/14/2019   ALT 486 (H) 03/14/2019   PROT 6.4 (L) 03/14/2019   ALBUMIN 2.5 (L) 03/14/2019   CALCIUM 8.5 (L) 03/14/2019   ANIONGAP 7 03/14/2019   Lab Results  Component Value Date   CHOL 127 02/21/2019   Lab Results  Component Value Date   HDL 40 02/21/2019   Lab Results  Component Value Date   LDLCALC 74 02/21/2019   Lab Results  Component Value Date   TRIG 54 02/21/2019   Lab Results  Component Value Date   CHOLHDL 3.2 02/21/2019   Lab Results  Component Value Date   HGBA1C 5.9 (A) 02/21/2019      Assessment & Plan:   Problem List Items Addressed This Visit      Cardiovascular and Mediastinum   HYPERTENSION, BENIGN ESSENTIAL    Uncontrolled today though he reports his home blood pressures are well controlled.  We will continue to monitor and plan to recheck in 4 weeks.        Digestive   Acute hepatitis A - Primary    He is significantly improved but still having a  little bit of discomfort on exam as well some decreased appetite fatigue and persistent jaundice.  We will recheck enzymes today.  Like to see him back in a month to make sure he is continuing to improve.  Blood pressure was not well controlled today but will follow again I see him back in a month.      Relevant Orders   COMPLETE METABOLIC PANEL WITH GFR   CBC     Endocrine   Type 2 diabetes mellitus with complication (HCC)    Uncontrolled.  Increase Toujeo to 34 units nightly for 2 nights.  Continue to increase by 2 units every 2 days until fasting blood sugar  is under 120.  Want to get to that dose then just stay on it and please call us if your blood sugar start to drop below 80.         No orders of the defined types were placed in this encounter.   Follow-up: Return in about 4 weeks (around 04/17/2019) for acute hepatitis.    Beatrice Lecher, MD

## 2019-03-21 ENCOUNTER — Ambulatory Visit: Payer: BC Managed Care – PPO | Admitting: Family Medicine

## 2019-03-21 LAB — CBC
HCT: 39.9 % (ref 38.5–50.0)
Hemoglobin: 13.1 g/dL — ABNORMAL LOW (ref 13.2–17.1)
MCH: 29.2 pg (ref 27.0–33.0)
MCHC: 32.8 g/dL (ref 32.0–36.0)
MCV: 89.1 fL (ref 80.0–100.0)
MPV: 11.9 fL (ref 7.5–12.5)
Platelets: 283 10*3/uL (ref 140–400)
RBC: 4.48 10*6/uL (ref 4.20–5.80)
RDW: 12.7 % (ref 11.0–15.0)
WBC: 8.5 10*3/uL (ref 3.8–10.8)

## 2019-03-21 LAB — COMPLETE METABOLIC PANEL WITH GFR
AG Ratio: 1 (calc) (ref 1.0–2.5)
ALT: 130 U/L — ABNORMAL HIGH (ref 9–46)
AST: 49 U/L — ABNORMAL HIGH (ref 10–35)
Albumin: 3.7 g/dL (ref 3.6–5.1)
Alkaline phosphatase (APISO): 528 U/L — ABNORMAL HIGH (ref 35–144)
BUN: 10 mg/dL (ref 7–25)
CO2: 28 mmol/L (ref 20–32)
Calcium: 9.1 mg/dL (ref 8.6–10.3)
Chloride: 92 mmol/L — ABNORMAL LOW (ref 98–110)
Creat: 0.75 mg/dL (ref 0.70–1.33)
GFR, Est African American: 124 mL/min/{1.73_m2} (ref >=60)
GFR, Est Non African American: 107 mL/min/{1.73_m2} (ref >=60)
Globulin: 3.8 g/dL (calc) — ABNORMAL HIGH (ref 1.9–3.7)
Glucose, Bld: 278 mg/dL — ABNORMAL HIGH (ref 65–99)
Potassium: 4.1 mmol/L (ref 3.5–5.3)
Sodium: 129 mmol/L — ABNORMAL LOW (ref 135–146)
Total Bilirubin: 2.9 mg/dL — ABNORMAL HIGH (ref 0.2–1.2)
Total Protein: 7.5 g/dL (ref 6.1–8.1)

## 2019-03-28 ENCOUNTER — Telehealth: Payer: Self-pay | Admitting: *Deleted

## 2019-03-28 NOTE — Telephone Encounter (Signed)
Pt advised that form has been completed he will p/u at the front desk.  Copy scanned into chart.[name], Viann Shove, CMA

## 2019-03-31 ENCOUNTER — Other Ambulatory Visit: Payer: Self-pay | Admitting: Family Medicine

## 2019-04-17 ENCOUNTER — Other Ambulatory Visit: Payer: Self-pay | Admitting: Family Medicine

## 2019-04-17 ENCOUNTER — Ambulatory Visit: Payer: BC Managed Care – PPO | Admitting: Family Medicine

## 2019-04-20 ENCOUNTER — Other Ambulatory Visit: Payer: Self-pay | Admitting: Family Medicine

## 2019-04-23 ENCOUNTER — Ambulatory Visit (INDEPENDENT_AMBULATORY_CARE_PROVIDER_SITE_OTHER): Payer: BC Managed Care – PPO | Admitting: Family Medicine

## 2019-04-23 ENCOUNTER — Encounter: Payer: Self-pay | Admitting: Family Medicine

## 2019-04-23 VITALS — BP 122/73 | HR 65 | Ht 68.0 in | Wt 190.0 lb

## 2019-04-23 DIAGNOSIS — N529 Male erectile dysfunction, unspecified: Secondary | ICD-10-CM

## 2019-04-23 DIAGNOSIS — B159 Hepatitis A without hepatic coma: Secondary | ICD-10-CM

## 2019-04-23 DIAGNOSIS — E118 Type 2 diabetes mellitus with unspecified complications: Secondary | ICD-10-CM | POA: Diagnosis not present

## 2019-04-23 DIAGNOSIS — I10 Essential (primary) hypertension: Secondary | ICD-10-CM

## 2019-04-23 LAB — COMPLETE METABOLIC PANEL WITH GFR
AG Ratio: 1.6 (calc) (ref 1.0–2.5)
ALT: 23 U/L (ref 9–46)
AST: 19 U/L (ref 10–35)
Albumin: 4.4 g/dL (ref 3.6–5.1)
Alkaline phosphatase (APISO): 111 U/L (ref 35–144)
BUN: 11 mg/dL (ref 7–25)
CO2: 29 mmol/L (ref 20–32)
Calcium: 9.8 mg/dL (ref 8.6–10.3)
Chloride: 94 mmol/L — ABNORMAL LOW (ref 98–110)
Creat: 0.95 mg/dL (ref 0.70–1.33)
GFR, Est African American: 108 mL/min/{1.73_m2} (ref >=60)
GFR, Est Non African American: 93 mL/min/{1.73_m2} (ref >=60)
Globulin: 2.8 g/dL (calc) (ref 1.9–3.7)
Glucose, Bld: 196 mg/dL — ABNORMAL HIGH (ref 65–139)
Potassium: 3.9 mmol/L (ref 3.5–5.3)
Sodium: 133 mmol/L — ABNORMAL LOW (ref 135–146)
Total Bilirubin: 1.1 mg/dL (ref 0.2–1.2)
Total Protein: 7.2 g/dL (ref 6.1–8.1)

## 2019-04-23 NOTE — Assessment & Plan Note (Signed)
Due to recheck liver enzymes. He is doing much better and weight is back up.

## 2019-04-23 NOTE — Assessment & Plan Note (Signed)
Refill his medications.

## 2019-04-23 NOTE — Assessment & Plan Note (Signed)
Well controlled. Continue current regimen. Follow up in 3 mo. Much better today.

## 2019-04-23 NOTE — Progress Notes (Signed)
Established Patient Office Visit  Subjective:  Patient ID: Larry Lawson, male    DOB: 08/21/1970  Age: 49 y.o. MRN: [id number]  CC:  Chief Complaint  Patient presents with  . Follow-up    HPI Larry Lawson presents for follow-up of recent diagnosis of hepatitis A.  When he came in after his hospitalization blood sugars were significantly elevated and he was using 26 units of Lantus.  Blood pressure was also uncontrolled but he was starting to feel somewhat better.  Ports that his appetite is still up and down but overall doing well.  He has had to increase his Lantus and is now up to 36 units but says his fasting blood sugars have been much more consistent.  Not had any chest pain or shortness of breath.  Did want a refill on his Cialis and sildenafil.  Evidently his insurance only pays for 3 sildenafil at [facility] so that he pays out-of-pocket as needed for the Cialis at [facility].  Past Medical History:  Diagnosis Date  . Diabetes mellitus   . Heart murmur   . Heart murmur   . Hypertension     No past surgical history on file.  Family History  Problem Relation Age of Onset  . Hyperlipidemia Mother   . Heart failure Mother   . Hypertension Father   . Prostate cancer Father   . Cancer Other        grandmother  . Diabetes Brother   . Heart attack Other        grandfather  . Heart attack Brother 46  . Heart failure Brother   . Heart disease Paternal Uncle     Social History   Socioeconomic History  . Marital status: Married    Spouse name: Not on file  . Number of children: Not on file  . Years of education: Not on file  . Highest education level: Not on file  Occupational History  . Not on file  Tobacco Use  . Smoking status: Light Tobacco Smoker    Packs/day: 0.50  . Smokeless tobacco: Current User  Substance and Sexual Activity  . Alcohol use: Yes  . Drug use: No  . 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  Other Topics Concern  . Not on file  Social History Narrative  . Not on file   Social Determinants of Health   Financial Resource Strain:   . Difficulty of Paying Living Expenses: Not on file  Food Insecurity:   . Worried About Programme researcher, broadcasting/film/video in the Last Year: Not on file  . Ran Out of Food in the Last Year: Not on file  Transportation Needs:   . Lack of Transportation (Medical): Not on file  . Lack of Transportation (Non-Medical): Not on file  Physical Activity:   . Days of Exercise per Week: Not on file  . Minutes of Exercise per Session: Not on file  Stress:   . Feeling of Stress : Not on file  Social Connections:   . Frequency of Communication with Friends and Family: Not on file  . Frequency of Social Gatherings with Friends and Family: Not on file  . Attends Religious Services: Not on file  . Active Member of Clubs or Organizations: Not on file  . Attends Banker Meetings: Not on file  . Marital Status: Not on file  Intimate Partner Violence:   .  Fear of Current or Ex-Partner: Not on file  . Emotionally Abused: Not on file  . Physically Abused: Not on file  . Sexually Abused: Not on file    Outpatient Medications Prior to Visit  Medication Sig Dispense Refill  . albuterol (VENTOLIN HFA) 108 (90 Base) MCG/ACT inhaler INHALE 2 PUFFS INTO THE LUNGS EVERY 6 HOURS AS NEEDED FOR WHEEZING. (Patient taking differently: Inhale 2 puffs into the lungs every 6 (six) hours as needed for wheezing or shortness of breath. INHALE 2 PUFFS INTO THE LUNGS EVERY 6 HOURS AS NEEDED FOR WHEEZING.) 18 g 5  . AMBULATORY NON FORMULARY MEDICATION Medication Name: Glucometer with strips and lancets to check daily.  Dx: Diabetes 250.00 1 Units 3  . amLODipine (NORVASC) 5 MG tablet TAKE 1 TABLET BY MOUTH EVERY DAY 30 tablet 3  . atorvastatin (LIPITOR) 40 MG tablet TAKE 1 TABLET BY MOUTH EVERY DAY (Patient taking differently: Take 40 mg by mouth daily  at 6 PM. ) 90 tablet 2  . clonazePAM (KLONOPIN) 0.5 MG tablet TAKE 1 TABLET (0.5 MG TOTAL) BY MOUTH DAILY AS NEEDED FOR ANXIETY. THIS IS A 30 DAY SUPPLY 20 tablet 0  . FLUoxetine (PROZAC) 40 MG capsule TAKE 1 CAPSULE BY MOUTH EVERY DAY (Patient taking differently: Take 40 mg by mouth daily. ) 90 capsule 1  . ibuprofen (ADVIL) 200 MG tablet Take 400 mg by mouth 2 (two) times daily as needed for moderate pain.    . Insulin Pen Needle (B-D ULTRAFINE III SHORT PEN) 31G X 8 MM MISC USE  TO ADMINISTER INSULIN, once daily. Dx E11.8 100 each 11  . JANUMET XR 50-1000 MG TB24 TAKE 1 TABLET BY MOUTH EVERY DAY 30 tablet 5  . losartan-hydrochlorothiazide (HYZAAR) 100-25 MG tablet TAKE 1 TABLET BY MOUTH EVERY DAY 90 tablet 1  . sildenafil (REVATIO) 20 MG tablet TAKE 2 TO 5 TABLETS BY MOUTH ONCE DAILY AS NEEDED 50 tablet 0  . TOUJEO SOLOSTAR 300 UNIT/ML SOPN INJECT 36 UNITS INTO THE SKIN AT BEDTIME. (Patient taking differently: Inject 26 Units into the skin at bedtime. ) 13.5 mL 1  . zolpidem (AMBIEN) 10 MG tablet Take 1 tablet (10 mg total) by mouth at bedtime as needed for sleep. 30 tablet 0   No facility-administered medications prior to visit.    Allergies  Allergen Reactions  . Gabapentin Other (See Comments)    Drowsiness   . Sulfonamide Derivatives     REACTION: pt. can't remember reaction    ROS Review of Systems    Objective:    Physical Exam  Constitutional: He is oriented to person, place, and time. He appears well-developed and well-nourished.  HENT:  Head: Normocephalic and atraumatic.  Cardiovascular: Normal rate, regular rhythm and normal heart sounds.  Pulmonary/Chest: Effort normal and breath sounds normal.  Abdominal: Soft. Bowel sounds are normal. He exhibits no distension and no mass. There is abdominal tenderness. There is no rebound and no guarding.  TTP in the LUQ and RLQ.    Neurological: He is alert and oriented to person, place, and time.  Skin: Skin is warm and dry.   Psychiatric: He has a normal mood and affect. His behavior is normal.    BP 122/73   Pulse 65   Ht 5\' 8"  (1.727 m)   Wt 190 lb (86.2 kg)   SpO2 97%   BMI 28.89 kg/m  Wt Readings from Last 3 Encounters:  04/23/19 190 lb (86.2 kg)  03/20/19 184 lb (83.5  kg)  03/13/19 188 lb (85.3 kg)     Health Maintenance Due  Topic Date Due  . HIV Screening  02/23/1984  . OPHTHALMOLOGY EXAM  05/03/2013  . COLONOSCOPY  02/23/2019  . FOOT EXAM  04/08/2019    There are no preventive care reminders to display for this patient.  Lab Results  Component Value Date   TSH 2.23 02/21/2019   Lab Results  Component Value Date   WBC 8.5 03/20/2019   HGB 13.1 (L) 03/20/2019   HCT 39.9 03/20/2019   MCV 89.1 03/20/2019   PLT 283 03/20/2019   Lab Results  Component Value Date   NA 129 (L) 03/20/2019   K 4.1 03/20/2019   CO2 28 03/20/2019   GLUCOSE 278 (H) 03/20/2019   BUN 10 03/20/2019   CREATININE 0.75 03/20/2019   BILITOT 2.9 (H) 03/20/2019   ALKPHOS 454 (H) 03/14/2019   AST 49 (H) 03/20/2019   ALT 130 (H) 03/20/2019   PROT 7.5 03/20/2019   ALBUMIN 2.5 (L) 03/14/2019   CALCIUM 9.1 03/20/2019   ANIONGAP 7 03/14/2019   Lab Results  Component Value Date   CHOL 127 02/21/2019   Lab Results  Component Value Date   HDL 40 02/21/2019   Lab Results  Component Value Date   LDLCALC 74 02/21/2019   Lab Results  Component Value Date   TRIG 54 02/21/2019   Lab Results  Component Value Date   CHOLHDL 3.2 02/21/2019   Lab Results  Component Value Date   HGBA1C 5.9 (A) 02/21/2019      Assessment & Plan:   Problem List Items Addressed This Visit      Cardiovascular and Mediastinum   HYPERTENSION, BENIGN ESSENTIAL    Well controlled. Continue current regimen. Follow up in 3 mo. Much better today.        Relevant Orders   COMPLETE METABOLIC PANEL WITH GFR     Digestive   Acute hepatitis A - Primary    Due to recheck liver enzymes. He is doing much better and weight is  back up.        Relevant Orders   COMPLETE METABOLIC PANEL WITH GFR     Endocrine   Type 2 diabetes mellitus with complication (HCC)    Sound like glucose is much better than it was previously.  Was bouncing all over the place.  He says now is up to 36 units on his Lantus and his fasting glucose this morning was 102 which is fantastic.  Continue with 36 units.  Aloe up in 3 months.      Relevant Orders   COMPLETE METABOLIC PANEL WITH GFR     Other   Erectile dysfunction    Refill his medications.         No orders of the defined types were placed in this encounter.   Follow-up: Return in about 3 months (around 07/21/2019) for Diabetes follow-up.    Beatrice Lecher, MD

## 2019-04-23 NOTE — Assessment & Plan Note (Signed)
Sound like glucose is much better than it was previously.  Was bouncing all over the place.  He says now is up to 36 units on his Lantus and his fasting glucose this morning was 102 which is fantastic.  Continue with 36 units.  Aloe up in 3 months.

## 2019-04-24 ENCOUNTER — Encounter: Payer: Self-pay | Admitting: Family Medicine

## 2019-04-24 ENCOUNTER — Ambulatory Visit (INDEPENDENT_AMBULATORY_CARE_PROVIDER_SITE_OTHER): Payer: BC Managed Care – PPO | Admitting: Family Medicine

## 2019-04-24 VITALS — BP 120/78

## 2019-04-24 DIAGNOSIS — R600 Localized edema: Secondary | ICD-10-CM

## 2019-04-24 DIAGNOSIS — N529 Male erectile dysfunction, unspecified: Secondary | ICD-10-CM | POA: Diagnosis not present

## 2019-04-24 DIAGNOSIS — E871 Hypo-osmolality and hyponatremia: Secondary | ICD-10-CM

## 2019-04-24 MED ORDER — SILDENAFIL CITRATE 20 MG PO TABS: ORAL_TABLET | ORAL | 5 refills | Status: DC

## 2019-04-24 NOTE — Progress Notes (Signed)
Pt stated that he has a concern about his sodium and chloride levels were low and an 8 lb weight gain over the past month, and concerned about having still some slight pain. He said that it is not like pain but tender. He has increased his water intake to 4L daily. He was doing 2L .

## 2019-04-24 NOTE — Progress Notes (Signed)
Virtual Visit via Video Note  I connected with Larry Lawson on 04/24/19 at 10:30 AM EST by a video enabled telemedicine application and verified that I am speaking with the correct person using two identifiers.   I discussed the limitations of evaluation and management by telemedicine and the availability of in person appointments. The patient expressed understanding and agreed to proceed.  Subjective:    CC: Go over test results   HPI: Pt stated that he has a concern about his sodium and chloride levels were low and an 8 lb weight gain over the past month, and concerned about having still some slight pain in his abdomen though admits it is much better than it was.. He said that it is not like pain but tender.  His sister is a Engineer, civil (consulting) and he had afforded his results to her and she had some questions and was particularly concerned about the low sodium levels.  He has increased his water intake to 4L daily and had increase of salt in his diet.  He reports that he has noticed some lower extremity swelling and some pitting around his ankles and feet.Marland Kitchen He was doing 2L   ED - [facility]. Says didn't need the Ambien.   Past medical history, Surgical history, Family history not pertinant except as noted below, Social history, Allergies, and medications have been entered into the medical record, reviewed, and corrections made.   Review of Systems: No fevers, chills, night sweats, weight loss, chest pain, or shortness of breath.   Objective:    General: Speaking clearly in complete sentences without any shortness of breath.  Alert and oriented x3.  Normal judgment. No apparent acute distress.    Impression and Recommendations:    Hyponatremia - sodium has been improving.  We discussed cutting back on fluid. He is drinking too much water and has increased his salt. Go back to usual diet without adding sodium and cut back to 84 oz of total fluid a day.   Acute hep A - great  news! His liver enzymes are back to normal.    Weight gain - likely from fluid retention. Cut back on salt and fluid intake and see if weight start trending down before the weekend. If not, asked him to call on Friday and will give him a diuretic if needed.    ED - refilled the sildenafil.     Time spent 22 min in encounter.    I discussed the assessment and treatment plan with the patient. The patient was provided an opportunity to ask questions and all were answered. The patient agreed with the plan and demonstrated an understanding of the instructions.   The patient was advised to call back or seek an in-person evaluation if the symptoms worsen or if the condition fails to improve as anticipated.   Nani Gasser, MD

## 2019-04-30 ENCOUNTER — Other Ambulatory Visit: Payer: Self-pay | Admitting: Family Medicine

## 2019-05-01 ENCOUNTER — Ambulatory Visit (INDEPENDENT_AMBULATORY_CARE_PROVIDER_SITE_OTHER): Payer: BC Managed Care – PPO | Admitting: Family Medicine

## 2019-05-01 ENCOUNTER — Encounter: Payer: Self-pay | Admitting: Family Medicine

## 2019-05-01 ENCOUNTER — Other Ambulatory Visit: Payer: Self-pay

## 2019-05-01 VITALS — BP 158/79 | HR 90 | Temp 99.5°F | Ht 68.0 in | Wt 189.0 lb

## 2019-05-01 DIAGNOSIS — R1013 Epigastric pain: Secondary | ICD-10-CM

## 2019-05-01 DIAGNOSIS — R112 Nausea with vomiting, unspecified: Secondary | ICD-10-CM | POA: Diagnosis not present

## 2019-05-01 NOTE — Progress Notes (Signed)
Acute Office Visit  Subjective:    Patient ID: Larry Lawson, male    DOB: 05-19-1970, 49 y.o.   MRN: [id number]  Chief Complaint  Patient presents with  . Abdominal Pain  . Emesis    HPI Patient is in today for vomiting.  Recent dx with acute viral hepatitis a that caused significant jaundice with significant elevation in liver enzymes.   He was actually feeling much better until this past weekend.  Says started vomiting again on Saturday.  Says it woke him up out of his sleep and vomited about 8 times overnight.  No fever, sweats or chills.  Vomit was brown.  He has had some dull discomfort in the upper abdominal area.  He says Saturday was the last time he actually vomited but he still feels extremely nauseated and is still having some intermittent upper epigastric pain maybe a little bit to the left compared to the right.  Only been eating really small amounts of rice, bananas and toast.  Denies change in bowel movement.  Said he had some evidence of trauma at home and has been using that but does seem to help.  He also picked up some over-the-counter omeprazole and he says he feels like that has helped a little bit as well.  He denies any excess gas or belching.  Past Medical History:  Diagnosis Date  . Diabetes mellitus   . Heart murmur   . Heart murmur   . Hypertension     History reviewed. No pertinent surgical history.  Family History  Problem Relation Age of Onset  . Hyperlipidemia Mother   . Heart failure Mother   . Hypertension Father   . Prostate cancer Father   . Cancer Other        grandmother  . Diabetes Brother   . Heart attack Other        grandfather  . Heart attack Brother 46  . Heart failure Brother   . Heart disease Paternal Uncle     Social History   Socioeconomic History  . Marital status: Married    Spouse name: Not on file  . Number of children: Not on file  . Years of education: Not on file  . Highest education level: Not on file   Occupational History  . Not on file  Tobacco Use  . Smoking status: Light Tobacco Smoker    Packs/day: 0.50  . Smokeless tobacco: Current User  Substance and Sexual Activity  . Alcohol use: Yes  . Drug use: No  . 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  Other Topics Concern  . Not on file  Social History Narrative  . Not on file   Social Determinants of Health   Financial Resource Strain:   . Difficulty of Paying Living Expenses: Not on file  Food Insecurity:   . Worried About Programme researcher, broadcasting/film/video in the Last Year: Not on file  . Ran Out of Food in the Last Year: Not on file  Transportation Needs:   . Lack of Transportation (Medical): Not on file  . Lack of Transportation (Non-Medical): Not on file  Physical Activity:   . Days of Exercise per Week: Not on file  . Minutes of Exercise per Session: Not on file  Stress:   . Feeling of Stress : Not on file  Social Connections:   . Frequency of Communication with Friends and  Family: Not on file  . Frequency of Social Gatherings with Friends and Family: Not on file  . Attends Religious Services: Not on file  . Active Member of Clubs or Organizations: Not on file  . Attends Archivist Meetings: Not on file  . Marital Status: Not on file  Intimate Partner Violence:   . Fear of Current or Ex-Partner: Not on file  . Emotionally Abused: Not on file  . Physically Abused: Not on file  . Sexually Abused: Not on file    Outpatient Medications Prior to Visit  Medication Sig Dispense Refill  . albuterol (VENTOLIN HFA) 108 (90 Base) MCG/ACT inhaler INHALE 2 PUFFS INTO THE LUNGS EVERY 6 HOURS AS NEEDED FOR WHEEZING. (Patient taking differently: Inhale 2 puffs into the lungs every 6 (six) hours as needed for wheezing or shortness of breath. INHALE 2 PUFFS INTO THE LUNGS EVERY 6 HOURS AS NEEDED FOR WHEEZING.) 18 g 5  . AMBULATORY NON FORMULARY  MEDICATION Medication Name: Glucometer with strips and lancets to check daily.  Dx: Diabetes 250.00 1 Units 3  . amLODipine (NORVASC) 5 MG tablet TAKE 1 TABLET BY MOUTH EVERY DAY 30 tablet 3  . atorvastatin (LIPITOR) 40 MG tablet TAKE 1 TABLET BY MOUTH EVERY DAY (Patient taking differently: Take 40 mg by mouth daily at 6 PM. ) 90 tablet 2  . clonazePAM (KLONOPIN) 0.5 MG tablet TAKE 1 TABLET (0.5 MG TOTAL) BY MOUTH DAILY AS NEEDED FOR ANXIETY. THIS IS A 30 DAY SUPPLY 20 tablet 0  . FLUoxetine (PROZAC) 40 MG capsule TAKE 1 CAPSULE BY MOUTH EVERY DAY (Patient taking differently: Take 40 mg by mouth daily. ) 90 capsule 1  . ibuprofen (ADVIL) 200 MG tablet Take 400 mg by mouth 2 (two) times daily as needed for moderate pain.    . Insulin Pen Needle (B-D ULTRAFINE III SHORT PEN) 31G X 8 MM MISC USE  TO ADMINISTER INSULIN, once daily. Dx E11.8 100 each 11  . JANUMET XR 50-1000 MG TB24 TAKE 1 TABLET BY MOUTH EVERY DAY 30 tablet 5  . losartan-hydrochlorothiazide (HYZAAR) 100-25 MG tablet TAKE 1 TABLET BY MOUTH EVERY DAY 90 tablet 1  . sildenafil (REVATIO) 20 MG tablet TAKE 2 TO 5 TABLETS BY MOUTH ONCE DAILY AS NEEDED 50 tablet 5  . TOUJEO SOLOSTAR 300 UNIT/ML SOPN INJECT 36 UNITS INTO THE SKIN AT BEDTIME. (Patient taking differently: Inject 26 Units into the skin at bedtime. ) 13.5 mL 1  . zolpidem (AMBIEN) 10 MG tablet Take 1 tablet (10 mg total) by mouth at bedtime as needed for sleep. 30 tablet 0   No facility-administered medications prior to visit.    Allergies  Allergen Reactions  . Gabapentin Other (See Comments)    Drowsiness   . Sulfonamide Derivatives     REACTION: pt. can't remember reaction    Review of Systems     Objective:    Physical Exam Constitutional:      Appearance: He is well-developed.  HENT:     Head: Normocephalic and atraumatic.  Cardiovascular:     Rate and Rhythm: Normal rate and regular rhythm.     Heart sounds: Normal heart sounds.  Pulmonary:     Effort:  Pulmonary effort is normal.     Breath sounds: Normal breath sounds.  Abdominal:     General: Bowel sounds are normal.     Palpations: There is no mass.     Tenderness: There is abdominal tenderness. There is no guarding.  Comments: Tender to palpation in the left upper quadrant and epigastric area.  Skin:    General: Skin is warm and dry.  Neurological:     Mental Status: He is alert and oriented to person, place, and time.  Psychiatric:        Behavior: Behavior normal.     BP (!) 158/79   Pulse 90   Temp 99.5 F (37.5 C)   Ht 5\' 8"  (1.727 m)   Wt 189 lb (85.7 kg)   SpO2 98%   BMI 28.74 kg/m  Wt Readings from Last 3 Encounters:  05/01/19 189 lb (85.7 kg)  04/23/19 190 lb (86.2 kg)  03/20/19 184 lb (83.5 kg)    Health Maintenance Due  Topic Date Due  . HIV Screening  02/23/1984  . OPHTHALMOLOGY EXAM  05/03/2013  . COLONOSCOPY  02/23/2019  . FOOT EXAM  04/08/2019    There are no preventive care reminders to display for this patient.   Lab Results  Component Value Date   TSH 2.23 02/21/2019   Lab Results  Component Value Date   WBC 8.5 03/20/2019   HGB 13.1 (L) 03/20/2019   HCT 39.9 03/20/2019   MCV 89.1 03/20/2019   PLT 283 03/20/2019   Lab Results  Component Value Date   NA 133 (L) 04/23/2019   K 3.9 04/23/2019   CO2 29 04/23/2019   GLUCOSE 196 (H) 04/23/2019   BUN 11 04/23/2019   CREATININE 0.95 04/23/2019   BILITOT 1.1 04/23/2019   ALKPHOS 454 (H) 03/14/2019   AST 19 04/23/2019   ALT 23 04/23/2019   PROT 7.2 04/23/2019   ALBUMIN 2.5 (L) 03/14/2019   CALCIUM 9.8 04/23/2019   ANIONGAP 7 03/14/2019   Lab Results  Component Value Date   CHOL 127 02/21/2019   Lab Results  Component Value Date   HDL 40 02/21/2019   Lab Results  Component Value Date   LDLCALC 74 02/21/2019   Lab Results  Component Value Date   TRIG 54 02/21/2019   Lab Results  Component Value Date   CHOLHDL 3.2 02/21/2019   Lab Results  Component Value Date    HGBA1C 5.9 (A) 02/21/2019       Assessment & Plan:   Problem List Items Addressed This Visit    None    Visit Diagnoses    Epigastric pain    -  Primary   Relevant Orders   COMPLETE METABOLIC PANEL WITH GFR   CBC with Differential/Platelet   Lipase   Non-intractable vomiting with nausea, unspecified vomiting type       Relevant Orders   COMPLETE METABOLIC PANEL WITH GFR   CBC with Differential/Platelet   Lipase     Epigastric/left upper quadrant pain associated with nausea and vomiting-we will evaluate further for possible pancreatitis.  He did have a recent episode of hepatitis A so we will recheck liver enzymes as well.  He does have a possible low-grade temperature today of 99.5.  He has not had a fever at home.  We will have him increase his omeprazole to 40 mg twice a day at least for the short-term he has about 15 tabs of Zofran left at home and says he will call if he runs out.  Just encouraged him to really push fluids and continue with bland diet for now. 12.  Also consider just acute gastritis as a potential cause.   No orders of the defined types were placed in this encounter.  Beatrice Lecher, MD

## 2019-05-01 NOTE — Patient Instructions (Signed)
Increase the omeprazole to 2 tabs twice a day.  If in a couple days of feeling better than okay to go down to 1 tab twice a day and then continue to taper off as you are doing well.

## 2019-05-02 ENCOUNTER — Other Ambulatory Visit: Payer: Self-pay | Admitting: Family Medicine

## 2019-05-02 LAB — LIPASE: Lipase: 11 U/L (ref 7–60)

## 2019-05-02 LAB — CBC WITH DIFFERENTIAL/PLATELET
Absolute Monocytes: 566 cells/uL (ref 200–950)
Basophils Absolute: 37 cells/uL (ref 0–200)
Basophils Relative: 0.3 %
Eosinophils Absolute: 0 cells/uL — ABNORMAL LOW (ref 15–500)
Eosinophils Relative: 0 %
HCT: 42.6 % (ref 38.5–50.0)
Hemoglobin: 14.6 g/dL (ref 13.2–17.1)
Lymphs Abs: 2423 cells/uL (ref 850–3900)
MCH: 29.7 pg (ref 27.0–33.0)
MCHC: 34.3 g/dL (ref 32.0–36.0)
MCV: 86.8 fL (ref 80.0–100.0)
MPV: 9.9 fL (ref 7.5–12.5)
Monocytes Relative: 4.6 %
Neutro Abs: 9274 cells/uL — ABNORMAL HIGH (ref 1500–7800)
Neutrophils Relative %: 75.4 %
Platelets: 267 10*3/uL (ref 140–400)
RBC: 4.91 10*6/uL (ref 4.20–5.80)
RDW: 12.6 % (ref 11.0–15.0)
Total Lymphocyte: 19.7 %
WBC: 12.3 10*3/uL — ABNORMAL HIGH (ref 3.8–10.8)

## 2019-05-02 LAB — COMPLETE METABOLIC PANEL WITH GFR
AG Ratio: 1.7 (calc) (ref 1.0–2.5)
ALT: 23 U/L (ref 9–46)
AST: 17 U/L (ref 10–35)
Albumin: 4.2 g/dL (ref 3.6–5.1)
Alkaline phosphatase (APISO): 105 U/L (ref 35–144)
BUN: 9 mg/dL (ref 7–25)
CO2: 28 mmol/L (ref 20–32)
Calcium: 9.7 mg/dL (ref 8.6–10.3)
Chloride: 98 mmol/L (ref 98–110)
Creat: 1.2 mg/dL (ref 0.70–1.33)
GFR, Est African American: 81 mL/min/{1.73_m2} (ref >=60)
GFR, Est Non African American: 70 mL/min/{1.73_m2} (ref >=60)
Globulin: 2.5 g/dL (calc) (ref 1.9–3.7)
Glucose, Bld: 181 mg/dL — ABNORMAL HIGH (ref 65–139)
Potassium: 4 mmol/L (ref 3.5–5.3)
Sodium: 134 mmol/L — ABNORMAL LOW (ref 135–146)
Total Bilirubin: 0.9 mg/dL (ref 0.2–1.2)
Total Protein: 6.7 g/dL (ref 6.1–8.1)

## 2019-05-12 ENCOUNTER — Other Ambulatory Visit: Payer: Self-pay | Admitting: Family Medicine

## 2019-05-16 ENCOUNTER — Other Ambulatory Visit: Payer: Self-pay | Admitting: Family Medicine

## 2019-05-22 DIAGNOSIS — F411 Generalized anxiety disorder: Secondary | ICD-10-CM | POA: Diagnosis not present

## 2019-05-22 DIAGNOSIS — F331 Major depressive disorder, recurrent, moderate: Secondary | ICD-10-CM | POA: Diagnosis not present

## 2019-05-31 ENCOUNTER — Other Ambulatory Visit: Payer: Self-pay | Admitting: Family Medicine

## 2019-06-10 ENCOUNTER — Other Ambulatory Visit: Payer: Self-pay | Admitting: Family Medicine

## 2019-06-21 DIAGNOSIS — L03012 Cellulitis of left finger: Secondary | ICD-10-CM | POA: Diagnosis not present

## 2019-06-21 DIAGNOSIS — Z87891 Personal history of nicotine dependence: Secondary | ICD-10-CM | POA: Diagnosis not present

## 2019-06-21 DIAGNOSIS — L03114 Cellulitis of left upper limb: Secondary | ICD-10-CM | POA: Diagnosis not present

## 2019-06-21 DIAGNOSIS — M7989 Other specified soft tissue disorders: Secondary | ICD-10-CM | POA: Diagnosis not present

## 2019-06-21 DIAGNOSIS — E119 Type 2 diabetes mellitus without complications: Secondary | ICD-10-CM | POA: Diagnosis not present

## 2019-06-21 DIAGNOSIS — M79642 Pain in left hand: Secondary | ICD-10-CM | POA: Diagnosis not present

## 2019-06-21 DIAGNOSIS — M79645 Pain in left finger(s): Secondary | ICD-10-CM | POA: Diagnosis not present

## 2019-07-03 DIAGNOSIS — F3342 Major depressive disorder, recurrent, in full remission: Secondary | ICD-10-CM | POA: Diagnosis not present

## 2019-07-03 DIAGNOSIS — F410 Panic disorder [episodic paroxysmal anxiety] without agoraphobia: Secondary | ICD-10-CM | POA: Diagnosis not present

## 2019-07-15 DIAGNOSIS — F17210 Nicotine dependence, cigarettes, uncomplicated: Secondary | ICD-10-CM | POA: Diagnosis not present

## 2019-07-15 DIAGNOSIS — R600 Localized edema: Secondary | ICD-10-CM | POA: Diagnosis not present

## 2019-07-15 DIAGNOSIS — S61203A Unspecified open wound of left middle finger without damage to nail, initial encounter: Secondary | ICD-10-CM | POA: Diagnosis not present

## 2019-07-15 DIAGNOSIS — S61402A Unspecified open wound of left hand, initial encounter: Secondary | ICD-10-CM | POA: Diagnosis not present

## 2019-07-15 DIAGNOSIS — E119 Type 2 diabetes mellitus without complications: Secondary | ICD-10-CM | POA: Diagnosis not present

## 2019-07-15 DIAGNOSIS — X58XXXA Exposure to other specified factors, initial encounter: Secondary | ICD-10-CM | POA: Diagnosis not present

## 2019-07-16 ENCOUNTER — Encounter: Payer: Self-pay | Admitting: Family Medicine

## 2019-07-16 ENCOUNTER — Ambulatory Visit (INDEPENDENT_AMBULATORY_CARE_PROVIDER_SITE_OTHER): Payer: BC Managed Care – PPO | Admitting: Family Medicine

## 2019-07-16 VITALS — BP 170/77 | HR 85 | Temp 99.0°F

## 2019-07-16 DIAGNOSIS — S61209A Unspecified open wound of unspecified finger without damage to nail, initial encounter: Secondary | ICD-10-CM

## 2019-07-16 DIAGNOSIS — L089 Local infection of the skin and subcutaneous tissue, unspecified: Secondary | ICD-10-CM

## 2019-07-16 DIAGNOSIS — M79645 Pain in left finger(s): Secondary | ICD-10-CM

## 2019-07-16 MED ORDER — DOXYCYCLINE HYCLATE 100 MG PO TABS: 100.0000 mg | ORAL_TABLET | Freq: Two times a day (BID) | ORAL | 0 refills | Status: DC

## 2019-07-16 MED ORDER — IBUPROFEN 600 MG PO TABS: 600.0000 mg | ORAL_TABLET | Freq: Three times a day (TID) | ORAL | 0 refills | Status: DC | PRN

## 2019-07-16 MED ORDER — HYDROCODONE-ACETAMINOPHEN 5-325 MG PO TABS: 1.0000 | ORAL_TABLET | Freq: Three times a day (TID) | ORAL | 0 refills | Status: DC | PRN

## 2019-07-16 NOTE — Progress Notes (Signed)
Acute Office Visit  Subjective:    Patient ID: Larry Lawson, male    DOB: Nov 01, 1970, 49 y.o.   MRN: [id number]  Chief Complaint  Patient presents with  . Wound Check    L middle finger. 3 x 2.2    HPI Patient is in today for open wound to his left middle finger.  He reports that he thinks he may have hit that middle finger while working on a roof he is not exactly sure.  He was trying to take care of the wound by himself.  He was evidently on clindamycin I am not exactly sure who prescribed that.  But he actually ended up going to the ED yesterday to have the finger looked at.  He said he had soaked it in peroxide for quite some time and pieces of skin had literally come off of the wound.  He also has a blister on the palmar side at the base of his fourth finger as well as a blister on his dorsum of his forearm on that left arm.  He says that they did do a wound culture released he asked him to do so he thinks that they did.  They wrote a prescription for him to switch to Keflex.  He says he has not picked it up yet.  He did great did bring in the discharge paperwork with him.  He is asking for something for pain in addition to an NSAID.  He says a couple days ago he actually had the wound with a razor blade. No fevers chills or sweats.  Past Medical History:  Diagnosis Date  . Diabetes mellitus   . Heart murmur   . Heart murmur   . Hypertension     No past surgical history on file.  Family History  Problem Relation Age of Onset  . Hyperlipidemia Mother   . Heart failure Mother   . Hypertension Father   . Prostate cancer Father   . Cancer Other        grandmother  . Diabetes Brother   . Heart attack Other        grandfather  . Heart attack Brother 46  . Heart failure Brother   . Heart disease Paternal Uncle     Social History   Socioeconomic History  . Marital status: Married    Spouse name: Not on file  . Number of children: Not on file  . Years of education:  Not on file  . Highest education level: Not on file  Occupational History  . Not on file  Tobacco Use  . Smoking status: Light Tobacco Smoker    Packs/day: 0.50  . Smokeless tobacco: Current User  Substance and Sexual Activity  . Alcohol use: Yes  . Drug use: No  . 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  Other Topics Concern  . Not on file  Social History Narrative  . Not on file   Social Determinants of Health   Financial Resource Strain:   . Difficulty of Paying Living Expenses:   Food Insecurity:   . Worried About Programme researcher, broadcasting/film/video in the Last Year:   . Barista in the Last Year:   Transportation Needs:   . Freight forwarder (Medical):   Marland Kitchen Lack of Transportation (Non-Medical):   Physical Activity:   . Days of Exercise per Week:   . Minutes  of Exercise per Session:   Stress:   . Feeling of Stress :   Social Connections:   . Frequency of Communication with Friends and Family:   . Frequency of Social Gatherings with Friends and Family:   . Attends Religious Services:   . Active Member of Clubs or Organizations:   . Attends Banker Meetings:   Marland Kitchen Marital Status:   Intimate Partner Violence:   . Fear of Current or Ex-Partner:   . Emotionally Abused:   Marland Kitchen Physically Abused:   . Sexually Abused:     Outpatient Medications Prior to Visit  Medication Sig Dispense Refill  . albuterol (VENTOLIN HFA) 108 (90 Base) MCG/ACT inhaler INHALE 2 PUFFS INTO THE LUNGS EVERY 6 HOURS AS NEEDED FOR WHEEZING. (Patient taking differently: Inhale 2 puffs into the lungs every 6 (six) hours as needed for wheezing or shortness of breath. INHALE 2 PUFFS INTO THE LUNGS EVERY 6 HOURS AS NEEDED FOR WHEEZING.) 18 g 5  . AMBULATORY NON FORMULARY MEDICATION Medication Name: Glucometer with strips and lancets to check daily.  Dx: Diabetes 250.00 1 Units 3  . amLODipine (NORVASC) 5 MG tablet  TAKE 1 TABLET BY MOUTH EVERY DAY 90 tablet 1  . atorvastatin (LIPITOR) 40 MG tablet TAKE 1 TABLET BY MOUTH EVERY DAY (Patient taking differently: Take 40 mg by mouth daily at 6 PM. ) 90 tablet 2  . clonazePAM (KLONOPIN) 0.5 MG tablet TAKE 1 TABLET (0.5 MG TOTAL) BY MOUTH DAILY AS NEEDED FOR ANXIETY. THIS IS A 30 DAY SUPPLY 20 tablet 0  . FLUoxetine (PROZAC) 40 MG capsule TAKE 1 CAPSULE BY MOUTH EVERY DAY (Patient taking differently: Take 40 mg by mouth daily. ) 90 capsule 1  . hydrochlorothiazide (MICROZIDE) 12.5 MG capsule TAKE 1 CAPSULE BY MOUTH EVERY DAY 90 capsule 1  . Insulin Pen Needle (B-D ULTRAFINE III SHORT PEN) 31G X 8 MM MISC USE  TO ADMINISTER INSULIN, once daily. Dx E11.8 100 each 11  . JANUMET XR 50-1000 MG TB24 TAKE 1 TABLET BY MOUTH EVERY DAY 30 tablet 5  . losartan-hydrochlorothiazide (HYZAAR) 100-25 MG tablet TAKE 1 TABLET BY MOUTH EVERY DAY 90 tablet 1  . sildenafil (REVATIO) 20 MG tablet TAKE 2 TO 5 TABLETS BY MOUTH ONCE DAILY AS NEEDED 50 tablet 5  . TOUJEO SOLOSTAR 300 UNIT/ML Solostar Pen INJECT 36 UNITS INTO THE SKIN AT BEDTIME. 15 pen 1  . zolpidem (AMBIEN) 10 MG tablet TAKE 1 TABLET BY MOUTH AT BEDTIME AS NEEDED FOR SLEEP. 30 tablet 3  . ibuprofen (ADVIL) 200 MG tablet Take 400 mg by mouth 2 (two) times daily as needed for moderate pain.     No facility-administered medications prior to visit.    Allergies  Allergen Reactions  . Gabapentin Other (See Comments)    Drowsiness   . Sulfonamide Derivatives     REACTION: pt. can't remember reaction    Review of Systems     Objective:    Physical Exam Vitals reviewed.  Constitutional:      Appearance: He is well-developed.  HENT:     Head: Normocephalic and atraumatic.  Eyes:     Conjunctiva/sclera: Conjunctivae normal.  Cardiovascular:     Rate and Rhythm: Normal rate.  Pulmonary:     Effort: Pulmonary effort is normal.  Musculoskeletal:     Comments: Large open wound on the distal left finger over the  DIP joint. Does have some serous drainage no active pus. He does have what looks  like a pustule or blister at the palmar side of the base of the fourth digit and he also had a pustule with some surrounding erythema on the dorsum of the left arm just below the elbow.  Skin:    General: Skin is dry.     Coloration: Skin is not pale.  Neurological:     Mental Status: He is alert and oriented to person, place, and time.  Psychiatric:        Behavior: Behavior normal.          There were no vitals taken for this visit. Wt Readings from Last 3 Encounters:  05/01/19 189 lb (85.7 kg)  04/23/19 190 lb (86.2 kg)  03/20/19 184 lb (83.5 kg)    Health Maintenance Due  Topic Date Due  . HIV Screening  Never done  . COVID-19 Vaccine (1) Never done  . OPHTHALMOLOGY EXAM  05/03/2013  . COLONOSCOPY  Never done  . FOOT EXAM  04/08/2019    There are no preventive care reminders to display for this patient.   Lab Results  Component Value Date   TSH 2.23 02/21/2019   Lab Results  Component Value Date   WBC 12.3 (H) 05/01/2019   HGB 14.6 05/01/2019   HCT 42.6 05/01/2019   MCV 86.8 05/01/2019   PLT 267 05/01/2019   Lab Results  Component Value Date   NA 134 (L) 05/01/2019   K 4.0 05/01/2019   CO2 28 05/01/2019   GLUCOSE 181 (H) 05/01/2019   BUN 9 05/01/2019   CREATININE 1.20 05/01/2019   BILITOT 0.9 05/01/2019   ALKPHOS 454 (H) 03/14/2019   AST 17 05/01/2019   ALT 23 05/01/2019   PROT 6.7 05/01/2019   ALBUMIN 2.5 (L) 03/14/2019   CALCIUM 9.7 05/01/2019   ANIONGAP 7 03/14/2019   Lab Results  Component Value Date   CHOL 127 02/21/2019   Lab Results  Component Value Date   HDL 40 02/21/2019   Lab Results  Component Value Date   LDLCALC 74 02/21/2019   Lab Results  Component Value Date   TRIG 54 02/21/2019   Lab Results  Component Value Date   CHOLHDL 3.2 02/21/2019   Lab Results  Component Value Date   HGBA1C 5.9 (A) 02/21/2019       Assessment & Plan:    Problem List Items Addressed This Visit    None    Visit Diagnoses    Open wound of finger, initial encounter    -  Primary   Skin pustule       Pain of finger of left hand         Wound culture obtained from the pustule on the left forearm. It sounds like they already did a culture on his fingers so we will try to call the hospital today to see if they can forward those results as soon as they are available. In the meantime we will put him on doxycycline to cover for possible MRSA.  The wound was cleaned and irrigated. Xeroform placed with cotton dressing on top and Coban. Okay to remove dressing in the morning okay to get wet in shower clean gently with antibacterial soap pat dry. Do not apply any additional ointments and apply the Xeroform and cotton dressing again.  Meds ordered this encounter  Medications  . ibuprofen (ADVIL) 600 MG tablet    Sig: Take 1 tablet (600 mg total) by mouth every 8 (eight) hours as needed.    Dispense:  30 tablet    Refill:  0  . doxycycline (VIBRA-TABS) 100 MG tablet    Sig: Take 1 tablet (100 mg total) by mouth 2 (two) times daily.    Dispense:  20 tablet    Refill:  0  . HYDROcodone-acetaminophen (NORCO/VICODIN) 5-325 MG tablet    Sig: Take 1 tablet by mouth every 8 (eight) hours as needed for moderate pain.    Dispense:  15 tablet    Refill:  0     Nani Gasser, MD

## 2019-07-16 NOTE — Addendum Note (Signed)
Addended by: Deno Etienne on: 07/16/2019 01:35 PM   Modules accepted: Orders

## 2019-07-17 ENCOUNTER — Ambulatory Visit (INDEPENDENT_AMBULATORY_CARE_PROVIDER_SITE_OTHER): Payer: BC Managed Care – PPO | Admitting: Family Medicine

## 2019-07-17 ENCOUNTER — Other Ambulatory Visit: Payer: Self-pay

## 2019-07-17 ENCOUNTER — Encounter: Payer: Self-pay | Admitting: Family Medicine

## 2019-07-17 VITALS — BP 136/81 | HR 82 | Ht 68.0 in | Wt 192.0 lb

## 2019-07-17 DIAGNOSIS — M79645 Pain in left finger(s): Secondary | ICD-10-CM | POA: Diagnosis not present

## 2019-07-17 DIAGNOSIS — S61209A Unspecified open wound of unspecified finger without damage to nail, initial encounter: Secondary | ICD-10-CM

## 2019-07-17 NOTE — Progress Notes (Signed)
Acute Office Visit  Subjective:    Patient ID: Larry Lawson, male    DOB: January 23, 1971, 49 y.o.   MRN: [id number]  Chief Complaint  Patient presents with  . Wound Check    HPI Patient is in today for f/u wound management.  He did change the dressing this morning and says it looks a little better to him.  She he also feels like the pustule on his fourth finger and on the dorsum of his left arm actually looks like it starting to dry up as well.  Again no fevers chills or sweats.  He did notice a little bit of drainage on the bandage.  Past Medical History:  Diagnosis Date  . Diabetes mellitus   . Heart murmur   . Heart murmur   . Hypertension     No past surgical history on file.  Family History  Problem Relation Age of Onset  . Hyperlipidemia Mother   . Heart failure Mother   . Hypertension Father   . Prostate cancer Father   . Cancer Other        grandmother  . Diabetes Brother   . Heart attack Other        grandfather  . Heart attack Brother 46  . Heart failure Brother   . Heart disease Paternal Uncle     Social History   Socioeconomic History  . Marital status: Married    Spouse name: Not on file  . Number of children: Not on file  . Years of education: Not on file  . Highest education level: Not on file  Occupational History  . Not on file  Tobacco Use  . Smoking status: Light Tobacco Smoker    Packs/day: 0.50  . Smokeless tobacco: Current User  Substance and Sexual Activity  . Alcohol use: Yes  . Drug use: No  . 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  Other Topics Concern  . Not on file  Social History Narrative  . Not on file   Social Determinants of Health   Financial Resource Strain:   . Difficulty of Paying Living Expenses:   Food Insecurity:   . Worried About Programme researcher, broadcasting/film/video in the Last Year:   . Barista in the Last Year:   Transportation  Needs:   . Freight forwarder (Medical):   Marland Kitchen Lack of Transportation (Non-Medical):   Physical Activity:   . Days of Exercise per Week:   . Minutes of Exercise per Session:   Stress:   . Feeling of Stress :   Social Connections:   . Frequency of Communication with Friends and Family:   . Frequency of Social Gatherings with Friends and Family:   . Attends Religious Services:   . Active Member of Clubs or Organizations:   . Attends Banker Meetings:   Marland Kitchen Marital Status:   Intimate Partner Violence:   . Fear of Current or Ex-Partner:   . Emotionally Abused:   Marland Kitchen Physically Abused:   . Sexually Abused:     Outpatient Medications Prior to Visit  Medication Sig Dispense Refill  . albuterol (VENTOLIN HFA) 108 (90 Base) MCG/ACT inhaler INHALE 2 PUFFS INTO THE LUNGS EVERY 6 HOURS AS NEEDED FOR WHEEZING. (Patient taking differently: Inhale 2 puffs into the lungs every 6 (six) hours as needed for wheezing or shortness of breath. INHALE 2 PUFFS INTO THE  LUNGS EVERY 6 HOURS AS NEEDED FOR WHEEZING.) 18 g 5  . AMBULATORY NON FORMULARY MEDICATION Medication Name: Glucometer with strips and lancets to check daily.  Dx: Diabetes 250.00 1 Units 3  . amLODipine (NORVASC) 5 MG tablet TAKE 1 TABLET BY MOUTH EVERY DAY 90 tablet 1  . atorvastatin (LIPITOR) 40 MG tablet TAKE 1 TABLET BY MOUTH EVERY DAY (Patient taking differently: Take 40 mg by mouth daily at 6 PM. ) 90 tablet 2  . clonazePAM (KLONOPIN) 0.5 MG tablet TAKE 1 TABLET (0.5 MG TOTAL) BY MOUTH DAILY AS NEEDED FOR ANXIETY. THIS IS A 30 DAY SUPPLY 20 tablet 0  . doxycycline (VIBRA-TABS) 100 MG tablet Take 1 tablet (100 mg total) by mouth 2 (two) times daily. 20 tablet 0  . FLUoxetine (PROZAC) 40 MG capsule TAKE 1 CAPSULE BY MOUTH EVERY DAY (Patient taking differently: Take 40 mg by mouth daily. ) 90 capsule 1  . hydrochlorothiazide (MICROZIDE) 12.5 MG capsule TAKE 1 CAPSULE BY MOUTH EVERY DAY 90 capsule 1  . ibuprofen (ADVIL) 600 MG  tablet Take 1 tablet (600 mg total) by mouth every 8 (eight) hours as needed. 30 tablet 0  . Insulin Pen Needle (B-D ULTRAFINE III SHORT PEN) 31G X 8 MM MISC USE  TO ADMINISTER INSULIN, once daily. Dx E11.8 100 each 11  . JANUMET XR 50-1000 MG TB24 TAKE 1 TABLET BY MOUTH EVERY DAY 30 tablet 5  . losartan-hydrochlorothiazide (HYZAAR) 100-25 MG tablet TAKE 1 TABLET BY MOUTH EVERY DAY 90 tablet 1  . sildenafil (REVATIO) 20 MG tablet TAKE 2 TO 5 TABLETS BY MOUTH ONCE DAILY AS NEEDED 50 tablet 5  . TOUJEO SOLOSTAR 300 UNIT/ML Solostar Pen INJECT 36 UNITS INTO THE SKIN AT BEDTIME. 15 pen 1  . zolpidem (AMBIEN) 10 MG tablet TAKE 1 TABLET BY MOUTH AT BEDTIME AS NEEDED FOR SLEEP. 30 tablet 3  . HYDROcodone-acetaminophen (NORCO/VICODIN) 5-325 MG tablet Take 1 tablet by mouth every 8 (eight) hours as needed for moderate pain. 15 tablet 0   No facility-administered medications prior to visit.    Allergies  Allergen Reactions  . Gabapentin Other (See Comments)    Drowsiness   . Sulfonamide Derivatives     REACTION: pt. can't remember reaction    Review of Systems     Objective:    Physical Exam  BP 136/81   Pulse 82   Ht 5\' 8"  (1.727 m)   Wt 192 lb (87.1 kg)   SpO2 99%   BMI 29.19 kg/m  Wt Readings from Last 3 Encounters:  07/17/19 192 lb (87.1 kg)  05/01/19 189 lb (85.7 kg)  04/23/19 190 lb (86.2 kg)    Health Maintenance Due  Topic Date Due  . HIV Screening  Never done  . COVID-19 Vaccine (1) Never done  . OPHTHALMOLOGY EXAM  05/03/2013  . COLONOSCOPY  Never done  . FOOT EXAM  04/08/2019    There are no preventive care reminders to display for this patient.   Lab Results  Component Value Date   TSH 2.23 02/21/2019   Lab Results  Component Value Date   WBC 12.3 (H) 05/01/2019   HGB 14.6 05/01/2019   HCT 42.6 05/01/2019   MCV 86.8 05/01/2019   PLT 267 05/01/2019   Lab Results  Component Value Date   NA 134 (L) 05/01/2019   K 4.0 05/01/2019   CO2 28 05/01/2019    GLUCOSE 181 (H) 05/01/2019   BUN 9 05/01/2019   CREATININE 1.20  05/01/2019   BILITOT 0.9 05/01/2019   ALKPHOS 454 (H) 03/14/2019   AST 17 05/01/2019   ALT 23 05/01/2019   PROT 6.7 05/01/2019   ALBUMIN 2.5 (L) 03/14/2019   CALCIUM 9.7 05/01/2019   ANIONGAP 7 03/14/2019   Lab Results  Component Value Date   CHOL 127 02/21/2019   Lab Results  Component Value Date   HDL 40 02/21/2019   Lab Results  Component Value Date   LDLCALC 74 02/21/2019   Lab Results  Component Value Date   TRIG 54 02/21/2019   Lab Results  Component Value Date   CHOLHDL 3.2 02/21/2019   Lab Results  Component Value Date   HGBA1C 5.9 (A) 02/21/2019       Assessment & Plan:   Problem List Items Addressed This Visit    None    Visit Diagnoses    Open wound of finger, initial encounter    -  Primary   Pain of finger of left hand         Open wound of finger it actually does look better today I think some of the swelling is down some of the erythema is now just more pink on exam.  No odor or active drainage such as pus there there is a little serous drainage.  Wound was cleaned and Xeroform replaced as well as gauze and some Coban dressing on top.  The pustule on the fourth finger looks like it is actually drying up as well as the one on the dorsum of the left arm that we cultured.  He did pick up the antibiotic and started it.  And also given him a short supply of hydrocodone he says he took 2 he brought the rest of the bottle in today and we disposed of it.  Towana Badger was witnessed at the disposal here in our office.  He is just relying on ibuprofen right now.  Commend return in 48 hours so that we can take a look at the wound before the weekend and make sure it is continuing to heal well.  No orders of the defined types were placed in this encounter.    Beatrice Lecher, MD

## 2019-07-19 ENCOUNTER — Other Ambulatory Visit: Payer: Self-pay

## 2019-07-19 ENCOUNTER — Ambulatory Visit (INDEPENDENT_AMBULATORY_CARE_PROVIDER_SITE_OTHER): Payer: BC Managed Care – PPO | Admitting: Family Medicine

## 2019-07-19 ENCOUNTER — Encounter: Payer: Self-pay | Admitting: Family Medicine

## 2019-07-19 VITALS — BP 162/89 | HR 72 | Ht 68.0 in | Wt 194.0 lb

## 2019-07-19 DIAGNOSIS — I10 Essential (primary) hypertension: Secondary | ICD-10-CM

## 2019-07-19 DIAGNOSIS — M79645 Pain in left finger(s): Secondary | ICD-10-CM | POA: Diagnosis not present

## 2019-07-19 DIAGNOSIS — S61209A Unspecified open wound of unspecified finger without damage to nail, initial encounter: Secondary | ICD-10-CM | POA: Diagnosis not present

## 2019-07-19 DIAGNOSIS — E118 Type 2 diabetes mellitus with unspecified complications: Secondary | ICD-10-CM

## 2019-07-19 LAB — WOUND CULTURE
MICRO NUMBER:: 10460490
SPECIMEN QUALITY:: ADEQUATE

## 2019-07-19 MED ORDER — AMOXICILLIN-POT CLAVULANATE 875-125 MG PO TABS: 1.0000 | ORAL_TABLET | Freq: Two times a day (BID) | ORAL | 0 refills | Status: DC

## 2019-07-19 MED ORDER — AMLODIPINE BESYLATE 10 MG PO TABS: 10.0000 mg | ORAL_TABLET | Freq: Every day | ORAL | 1 refills | Status: DC

## 2019-07-19 NOTE — Progress Notes (Signed)
Established Patient Office Visit  Subjective:  Patient ID: Larry Lawson, male    DOB: Apr 10, 1970  Age: 49 y.o. MRN: [id number]  CC:  Chief Complaint  Patient presents with  . Wound Check    HPI Larry Lawson presents for   Diabetes - no hypoglycemic events. No wounds or sores that are not healing well. No increased thirst or urination. Checking glucose at home. Taking medications as prescribed without any side effects.  F/U wound care - CX results back today. Strep pyogenes.  He has still notices some serous drainage.  No fever or chills.    Wound culture Order: [id number] Status:  Final result Visible to patient:  Yes (MyChart) Next appt:  Today at 08:10 AM in Family Medicine Nani Gasser, MD) Dx:  Skin pustule Specimen Information: Pustule; Wound     Component 3 d ago  MICRO NUMBER: [id number] P   SPECIMEN QUALITY: Adequate P   SOURCE: NOT GIVEN P   STATUS: PRELIMINARY P   GRAM STAIN: Gram positive cocci in chains   Comment: No white blood cells seen No epithelial cells seen Few Gram positive cocci in chains  ISOLATE 1: Streptococcus pyogenesAbnormal    Comment: Heavy growth of Group A Streptococcus isolated Beta-hemolytic streptococci are predictably susceptible to Penicillin and other beta-lactams. Susceptibility testing not routinely performed. Please contact the laboratory within 3 days if susceptibility  testing is desired.          Past Medical History:  Diagnosis Date  . Diabetes mellitus   . Heart murmur   . Heart murmur   . Hypertension     No past surgical history on file.  Family History  Problem Relation Age of Onset  . Hyperlipidemia Mother   . Heart failure Mother   . Hypertension Father   . Prostate cancer Father   . Cancer Other        grandmother  . Diabetes Brother   . Heart attack Other        grandfather  . Heart attack Brother 46  . Heart failure Brother   . Heart disease Paternal Uncle     Social History    Socioeconomic History  . Marital status: Married    Spouse name: Not on file  . Number of children: Not on file  . Years of education: Not on file  . Highest education level: Not on file  Occupational History  . Not on file  Tobacco Use  . Smoking status: Light Tobacco Smoker    Packs/day: 0.50  . Smokeless tobacco: Current User  Substance and Sexual Activity  . Alcohol use: Yes  . Drug use: No  . 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  Other Topics Concern  . Not on file  Social History Narrative  . Not on file   Social Determinants of Health   Financial Resource Strain:   . Difficulty of Paying Living Expenses:   Food Insecurity:   . Worried About Programme researcher, broadcasting/film/video in the Last Year:   . Barista in the Last Year:   Transportation Needs:   . Freight forwarder (Medical):   Marland Kitchen Lack of Transportation (Non-Medical):   Physical Activity:   . Days of Exercise per Week:   . Minutes of Exercise per Session:   Stress:   . Feeling of Stress :   Social Connections:   . Frequency  of Communication with Friends and Family:   . Frequency of Social Gatherings with Friends and Family:   . Attends Religious Services:   . Active Member of Clubs or Organizations:   . Attends Banker Meetings:   Marland Kitchen Marital Status:   Intimate Partner Violence:   . Fear of Current or Ex-Partner:   . Emotionally Abused:   Marland Kitchen Physically Abused:   . Sexually Abused:     Outpatient Medications Prior to Visit  Medication Sig Dispense Refill  . albuterol (VENTOLIN HFA) 108 (90 Base) MCG/ACT inhaler INHALE 2 PUFFS INTO THE LUNGS EVERY 6 HOURS AS NEEDED FOR WHEEZING. (Patient taking differently: Inhale 2 puffs into the lungs every 6 (six) hours as needed for wheezing or shortness of breath. INHALE 2 PUFFS INTO THE LUNGS EVERY 6 HOURS AS NEEDED FOR WHEEZING.) 18 g 5  . AMBULATORY NON FORMULARY  MEDICATION Medication Name: Glucometer with strips and lancets to check daily.  Dx: Diabetes 250.00 1 Units 3  . atorvastatin (LIPITOR) 40 MG tablet TAKE 1 TABLET BY MOUTH EVERY DAY (Patient taking differently: Take 40 mg by mouth daily at 6 PM. ) 90 tablet 2  . clonazePAM (KLONOPIN) 0.5 MG tablet TAKE 1 TABLET (0.5 MG TOTAL) BY MOUTH DAILY AS NEEDED FOR ANXIETY. THIS IS A 30 DAY SUPPLY 20 tablet 0  . FLUoxetine (PROZAC) 40 MG capsule TAKE 1 CAPSULE BY MOUTH EVERY DAY (Patient taking differently: Take 40 mg by mouth daily. ) 90 capsule 1  . hydrochlorothiazide (MICROZIDE) 12.5 MG capsule TAKE 1 CAPSULE BY MOUTH EVERY DAY 90 capsule 1  . ibuprofen (ADVIL) 600 MG tablet Take 1 tablet (600 mg total) by mouth every 8 (eight) hours as needed. 30 tablet 0  . Insulin Pen Needle (B-D ULTRAFINE III SHORT PEN) 31G X 8 MM MISC USE  TO ADMINISTER INSULIN, once daily. Dx E11.8 100 each 11  . JANUMET XR 50-1000 MG TB24 TAKE 1 TABLET BY MOUTH EVERY DAY 30 tablet 5  . losartan-hydrochlorothiazide (HYZAAR) 100-25 MG tablet TAKE 1 TABLET BY MOUTH EVERY DAY 90 tablet 1  . sildenafil (REVATIO) 20 MG tablet TAKE 2 TO 5 TABLETS BY MOUTH ONCE DAILY AS NEEDED 50 tablet 5  . TOUJEO SOLOSTAR 300 UNIT/ML Solostar Pen INJECT 36 UNITS INTO THE SKIN AT BEDTIME. 15 pen 1  . zolpidem (AMBIEN) 10 MG tablet TAKE 1 TABLET BY MOUTH AT BEDTIME AS NEEDED FOR SLEEP. 30 tablet 3  . amLODipine (NORVASC) 5 MG tablet TAKE 1 TABLET BY MOUTH EVERY DAY 90 tablet 1  . cephALEXin (KEFLEX) 500 MG capsule Take 500 mg by mouth every 8 (eight) hours.    Marland Kitchen doxycycline (VIBRA-TABS) 100 MG tablet Take 1 tablet (100 mg total) by mouth 2 (two) times daily. 20 tablet 0   No facility-administered medications prior to visit.    Allergies  Allergen Reactions  . Gabapentin Other (See Comments)    Drowsiness   . Sulfonamide Derivatives     REACTION: pt. can't remember reaction    ROS Review of Systems    Objective:    Physical Exam  BP (!)  162/89   Pulse 72   Ht 5\' 8"  (1.727 m)   Wt 194 lb (88 kg)   SpO2 100%   BMI 29.50 kg/m  Wt Readings from Last 3 Encounters:  07/19/19 194 lb (88 kg)  07/17/19 192 lb (87.1 kg)  05/01/19 189 lb (85.7 kg)     Health Maintenance Due  Topic Date Due  .  HIV Screening  Never done  . COVID-19 Vaccine (1) Never done  . OPHTHALMOLOGY EXAM  05/03/2013  . COLONOSCOPY  Never done  . FOOT EXAM  04/08/2019    There are no preventive care reminders to display for this patient.  Lab Results  Component Value Date   TSH 2.23 02/21/2019   Lab Results  Component Value Date   WBC 12.3 (H) 05/01/2019   HGB 14.6 05/01/2019   HCT 42.6 05/01/2019   MCV 86.8 05/01/2019   PLT 267 05/01/2019   Lab Results  Component Value Date   NA 134 (L) 05/01/2019   K 4.0 05/01/2019   CO2 28 05/01/2019   GLUCOSE 181 (H) 05/01/2019   BUN 9 05/01/2019   CREATININE 1.20 05/01/2019   BILITOT 0.9 05/01/2019   ALKPHOS 454 (H) 03/14/2019   AST 17 05/01/2019   ALT 23 05/01/2019   PROT 6.7 05/01/2019   ALBUMIN 2.5 (L) 03/14/2019   CALCIUM 9.7 05/01/2019   ANIONGAP 7 03/14/2019   Lab Results  Component Value Date   CHOL 127 02/21/2019   Lab Results  Component Value Date   HDL 40 02/21/2019   Lab Results  Component Value Date   LDLCALC 74 02/21/2019   Lab Results  Component Value Date   TRIG 54 02/21/2019   Lab Results  Component Value Date   CHOLHDL 3.2 02/21/2019   Lab Results  Component Value Date   HGBA1C 5.9 (A) 02/21/2019      Assessment & Plan:   Problem List Items Addressed This Visit      Cardiovascular and Mediastinum   HYPERTENSION, BENIGN ESSENTIAL    Not well controlled today though it did look better than when he was here couple days ago.  Umbilical had an increase his amlodipine to 10 mg to 5 mg.      Relevant Medications   amLODipine (NORVASC) 10 MG tablet     Endocrine   Type 2 diabetes mellitus with complication (HCC) - Primary    Foot exam performed today.   Referral placed to optometry.  Will return on Monday for A1c.      Relevant Orders   Ambulatory referral to Optometry    Other Visit Diagnoses    Open wound of finger, initial encounter       Relevant Medications   amoxicillin-clavulanate (AUGMENTIN) 875-125 MG tablet   Pain of finger of left hand         Open wound-again the swelling and redness looks improved.  Still serous drainage on the bandage.  Decided to switch to DuoDERM today is a still not see a good epithelial layer on the top of the wound.  He will keep it on until Sunday at that point he can take it off rinse it pat dry and apply Xeroform until we see him back on Monday.  Go ahead and switch him to Augmentin since has been taking doxycycline.  New prescription sent to pharmacy.  Meds ordered this encounter  Medications  . DISCONTD: amoxicillin-clavulanate (AUGMENTIN) 875-125 MG tablet    Sig: Take 1 tablet by mouth 2 (two) times daily.    Dispense:  20 tablet    Refill:  0  . amLODipine (NORVASC) 10 MG tablet    Sig: Take 1 tablet (10 mg total) by mouth daily.    Dispense:  90 tablet    Refill:  1  . amoxicillin-clavulanate (AUGMENTIN) 875-125 MG tablet    Sig: Take 1 tablet by mouth 2 (two)  times daily.    Dispense:  20 tablet    Refill:  0    Follow-up: No follow-ups on file.    Beatrice Lecher, MD

## 2019-07-19 NOTE — Assessment & Plan Note (Signed)
Not well controlled today though it did look better than when he was here couple days ago.  Umbilical had an increase his amlodipine to 10 mg to 5 mg.

## 2019-07-19 NOTE — Assessment & Plan Note (Signed)
Foot exam performed today.  Referral placed to optometry.  Will return on Monday for A1c.

## 2019-07-23 ENCOUNTER — Ambulatory Visit (INDEPENDENT_AMBULATORY_CARE_PROVIDER_SITE_OTHER): Payer: BC Managed Care – PPO | Admitting: Family Medicine

## 2019-07-23 ENCOUNTER — Encounter: Payer: Self-pay | Admitting: Family Medicine

## 2019-07-23 VITALS — BP 148/73 | HR 83 | Ht 68.0 in | Wt 190.0 lb

## 2019-07-23 DIAGNOSIS — I10 Essential (primary) hypertension: Secondary | ICD-10-CM | POA: Diagnosis not present

## 2019-07-23 DIAGNOSIS — S61209A Unspecified open wound of unspecified finger without damage to nail, initial encounter: Secondary | ICD-10-CM | POA: Diagnosis not present

## 2019-07-23 DIAGNOSIS — E118 Type 2 diabetes mellitus with unspecified complications: Secondary | ICD-10-CM | POA: Diagnosis not present

## 2019-07-23 DIAGNOSIS — Z794 Long term (current) use of insulin: Secondary | ICD-10-CM

## 2019-07-23 LAB — POCT GLYCOSYLATED HEMOGLOBIN (HGB A1C): Hemoglobin A1C: 7.4 % — AB (ref 4.0–5.6)

## 2019-07-23 MED ORDER — BD PEN NEEDLE SHORT U/F 31G X 8 MM MISC: 11 refills | Status: DC

## 2019-07-23 NOTE — Patient Instructions (Signed)
1 week for wound check

## 2019-07-23 NOTE — Progress Notes (Signed)
Established Patient Office Visit  Subjective:  Patient ID: Larry Lawson, male    DOB: 06/28/1970  Age: 49 y.o. MRN: [id number]  CC:  Chief Complaint  Patient presents with  . Wound Check  . Diabetes    HPI Larry Lawson presents for   Diabetes - no hypoglycemic events. No wounds or sores that are not healing well. No increased thirst or urination. Checking glucose at home. Taking medications as prescribed without any side effects.  Hypertension- Pt denies chest pain, SOB, dizziness, or heart palpitations.  Taking meds as directed w/o problems.  Denies medication side effects.   F/O Open Wound -he been using the DuoDERM he was able to keep it on for about 3 days before he changed and that it actually felt like it was helping.  He still had a little bit of drainage around the corners of the DuoDERM.  He says he may just have to quit working completely for right now because he feels like he just continues to get up and being get an injury while at work.  Past Medical History:  Diagnosis Date  . Diabetes mellitus   . Heart murmur   . Heart murmur   . Hypertension     No past surgical history on file.  Family History  Problem Relation Age of Onset  . Hyperlipidemia Mother   . Heart failure Mother   . Hypertension Father   . Prostate cancer Father   . Cancer Other        grandmother  . Diabetes Brother   . Heart attack Other        grandfather  . Heart attack Brother 46  . Heart failure Brother   . Heart disease Paternal Uncle     Social History   Socioeconomic History  . Marital status: Married    Spouse name: Not on file  . Number of children: Not on file  . Years of education: Not on file  . Highest education level: Not on file  Occupational History  . Not on file  Tobacco Use  . Smoking status: Light Tobacco Smoker    Packs/day: 0.50  . Smokeless tobacco: Current User  Substance and Sexual Activity  . Alcohol use: Yes  . Drug use: No  . 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  Other Topics Concern  . Not on file  Social History Narrative  . Not on file   Social Determinants of Health   Financial Resource Strain:   . Difficulty of Paying Living Expenses:   Food Insecurity:   . Worried About Programme researcher, broadcasting/film/video in the Last Year:   . Barista in the Last Year:   Transportation Needs:   . Freight forwarder (Medical):   Marland Kitchen Lack of Transportation (Non-Medical):   Physical Activity:   . Days of Exercise per Week:   . Minutes of Exercise per Session:   Stress:   . Feeling of Stress :   Social Connections:   . Frequency of Communication with Friends and Family:   . Frequency of Social Gatherings with Friends and Family:   . Attends Religious Services:   . Active Member of Clubs or Organizations:   . Attends Banker Meetings:   Marland Kitchen Marital Status:   Intimate Partner Violence:   . Fear of Current or Ex-Partner:   . Emotionally Abused:   .  Physically Abused:   . Sexually Abused:     Outpatient Medications Prior to Visit  Medication Sig Dispense Refill  . albuterol (VENTOLIN HFA) 108 (90 Base) MCG/ACT inhaler INHALE 2 PUFFS INTO THE LUNGS EVERY 6 HOURS AS NEEDED FOR WHEEZING. (Patient taking differently: Inhale 2 puffs into the lungs every 6 (six) hours as needed for wheezing or shortness of breath. INHALE 2 PUFFS INTO THE LUNGS EVERY 6 HOURS AS NEEDED FOR WHEEZING.) 18 g 5  . AMBULATORY NON FORMULARY MEDICATION Medication Name: Glucometer with strips and lancets to check daily.  Dx: Diabetes 250.00 1 Units 3  . amLODipine (NORVASC) 10 MG tablet Take 1 tablet (10 mg total) by mouth daily. 90 tablet 1  . amoxicillin-clavulanate (AUGMENTIN) 875-125 MG tablet Take 1 tablet by mouth 2 (two) times daily. 20 tablet 0  . atorvastatin (LIPITOR) 40 MG tablet TAKE 1 TABLET BY MOUTH EVERY DAY (Patient taking differently: Take 40 mg  by mouth daily at 6 PM. ) 90 tablet 2  . clonazePAM (KLONOPIN) 0.5 MG tablet TAKE 1 TABLET (0.5 MG TOTAL) BY MOUTH DAILY AS NEEDED FOR ANXIETY. THIS IS A 30 DAY SUPPLY 20 tablet 0  . FLUoxetine (PROZAC) 40 MG capsule TAKE 1 CAPSULE BY MOUTH EVERY DAY (Patient taking differently: Take 40 mg by mouth daily. ) 90 capsule 1  . ibuprofen (ADVIL) 600 MG tablet Take 1 tablet (600 mg total) by mouth every 8 (eight) hours as needed. 30 tablet 0  . JANUMET XR 50-1000 MG TB24 TAKE 1 TABLET BY MOUTH EVERY DAY 30 tablet 5  . losartan-hydrochlorothiazide (HYZAAR) 100-25 MG tablet TAKE 1 TABLET BY MOUTH EVERY DAY 90 tablet 1  . sildenafil (REVATIO) 20 MG tablet TAKE 2 TO 5 TABLETS BY MOUTH ONCE DAILY AS NEEDED 50 tablet 5  . TOUJEO SOLOSTAR 300 UNIT/ML Solostar Pen INJECT 36 UNITS INTO THE SKIN AT BEDTIME. 15 pen 1  . zolpidem (AMBIEN) 10 MG tablet TAKE 1 TABLET BY MOUTH AT BEDTIME AS NEEDED FOR SLEEP. 30 tablet 3  . hydrochlorothiazide (MICROZIDE) 12.5 MG capsule TAKE 1 CAPSULE BY MOUTH EVERY DAY 90 capsule 1  . Insulin Pen Needle (B-D ULTRAFINE III SHORT PEN) 31G X 8 MM MISC USE  TO ADMINISTER INSULIN, once daily. Dx E11.8 100 each 11   No facility-administered medications prior to visit.    Allergies  Allergen Reactions  . Gabapentin Other (See Comments)    Drowsiness   . Sulfonamide Derivatives     REACTION: pt. can't remember reaction    ROS Review of Systems    Objective:    Physical Exam  Constitutional: He is oriented to person, place, and time. He appears well-developed and well-nourished.  HENT:  Head: Normocephalic and atraumatic.  Eyes: Conjunctivae and EOM are normal.  Cardiovascular: Normal rate.  Pulmonary/Chest: Effort normal.  Neurological: He is alert and oriented to person, place, and time.  Skin: Skin is dry. No pallor.  Psychiatric: He has a normal mood and affect. His behavior is normal.  Vitals reviewed.        BP (!) 148/73   Pulse 83   Ht 5\' 8"  (1.727 m)    Wt 190 lb (86.2 kg)   SpO2 100%   BMI 28.89 kg/m  Wt Readings from Last 3 Encounters:  07/23/19 190 lb (86.2 kg)  07/19/19 194 lb (88 kg)  07/17/19 192 lb (87.1 kg)     Health Maintenance Due  Topic Date Due  . HIV Screening  Never done  .  COVID-19 Vaccine (1) Never done  . OPHTHALMOLOGY EXAM  05/03/2013  . COLONOSCOPY  Never done    There are no preventive care reminders to display for this patient.  Lab Results  Component Value Date   TSH 2.23 02/21/2019   Lab Results  Component Value Date   WBC 12.3 (H) 05/01/2019   HGB 14.6 05/01/2019   HCT 42.6 05/01/2019   MCV 86.8 05/01/2019   PLT 267 05/01/2019   Lab Results  Component Value Date   NA 134 (L) 05/01/2019   K 4.0 05/01/2019   CO2 28 05/01/2019   GLUCOSE 181 (H) 05/01/2019   BUN 9 05/01/2019   CREATININE 1.20 05/01/2019   BILITOT 0.9 05/01/2019   ALKPHOS 454 (H) 03/14/2019   AST 17 05/01/2019   ALT 23 05/01/2019   PROT 6.7 05/01/2019   ALBUMIN 2.5 (L) 03/14/2019   CALCIUM 9.7 05/01/2019   ANIONGAP 7 03/14/2019   Lab Results  Component Value Date   CHOL 127 02/21/2019   Lab Results  Component Value Date   HDL 40 02/21/2019   Lab Results  Component Value Date   LDLCALC 74 02/21/2019   Lab Results  Component Value Date   TRIG 54 02/21/2019   Lab Results  Component Value Date   CHOLHDL 3.2 02/21/2019   Lab Results  Component Value Date   HGBA1C 7.4 (A) 07/23/2019      Assessment & Plan:   Problem List Items Addressed This Visit      Cardiovascular and Mediastinum   HYPERTENSION, BENIGN ESSENTIAL - Primary    Blood pressure elevated today.  It looks better last week when he was here so we will just continue to keep an eye on it.  For now continue with losartan HCTZ and amlodipine 10 mg. He just restarted his amlodipine today so hopefully at his next visit in 1 week blood pressures will be better controlled.        Endocrine   Type 2 diabetes mellitus with complication (HCC)     His A1c was up significantly compared to previous.  Last A1c in December was 5.9 today it was 7.4. Encouraged hin to take meds consistently.  Admits he has not been using them consistently.      Relevant Medications   Insulin Pen Needle (B-D ULTRAFINE III SHORT PEN) 31G X 8 MM MISC   Other Relevant Orders   POCT glycosylated hemoglobin (Hb A1C) (Completed)    Other Visit Diagnoses    Open wound of finger, initial encounter       Type 2 diabetes mellitus with complication, with long-term current use of insulin (HCC)       Relevant Medications   Insulin Pen Needle (B-D ULTRAFINE III SHORT PEN) 31G X 8 MM MISC     Open wound -he was unable to pick up the Augmentin from the pharmacy it looks like it was called into the correct pharmacy so encouraged him to call them again today and try to get started on the medication.  The erythema looks much better but it still looks quite swollen.  Will cover with DuoDERM and then Coban dressing on top.  Okay to change the bandage in 3 days and replace dressing.  I will see him back in 1 week.  Meds ordered this encounter  Medications  . Insulin Pen Needle (B-D ULTRAFINE III SHORT PEN) 31G X 8 MM MISC    Sig: USE  TO ADMINISTER INSULIN, once daily. Dx E11.8  Dispense:  100 each    Refill:  11    Follow-up: Return in about 3 months (around 10/23/2019) for DM.    Nani Gasser, MD

## 2019-07-23 NOTE — Assessment & Plan Note (Signed)
His A1c was up significantly compared to previous.  Last A1c in December was 5.9 today it was 7.4. Encouraged hin to take meds consistently.  Admits he has not been using them consistently.

## 2019-07-23 NOTE — Assessment & Plan Note (Signed)
Blood pressure elevated today.  It looks better last week when he was here so we will just continue to keep an eye on it.  For now continue with losartan HCTZ and amlodipine 10 mg. He just restarted his amlodipine today so hopefully at his next visit in 1 week blood pressures will be better controlled.

## 2019-07-28 ENCOUNTER — Other Ambulatory Visit: Payer: Self-pay | Admitting: Family Medicine

## 2019-07-30 ENCOUNTER — Ambulatory Visit: Payer: BC Managed Care – PPO | Admitting: Family Medicine

## 2019-08-10 ENCOUNTER — Encounter: Payer: Self-pay | Admitting: Nurse Practitioner

## 2019-08-10 ENCOUNTER — Ambulatory Visit: Payer: BC Managed Care – PPO | Admitting: Nurse Practitioner

## 2019-08-10 ENCOUNTER — Inpatient Hospital Stay (HOSPITAL_COMMUNITY)
Admission: EM | Admit: 2019-08-10 | Discharge: 2019-08-12 | DRG: 988 | Disposition: A | Discharge status: Home / Self Care | Payer: BC Managed Care – PPO | Source: Ambulatory Visit | Attending: Internal Medicine | Admitting: Internal Medicine

## 2019-08-10 ENCOUNTER — Ambulatory Visit (INDEPENDENT_AMBULATORY_CARE_PROVIDER_SITE_OTHER): Payer: BC Managed Care – PPO

## 2019-08-10 ENCOUNTER — Encounter (HOSPITAL_COMMUNITY): Payer: Self-pay

## 2019-08-10 ENCOUNTER — Other Ambulatory Visit: Payer: Self-pay

## 2019-08-10 ENCOUNTER — Ambulatory Visit (INDEPENDENT_AMBULATORY_CARE_PROVIDER_SITE_OTHER): Payer: BC Managed Care – PPO | Admitting: Sports Medicine

## 2019-08-10 VITALS — BP 130/82 | HR 82 | Temp 98.5°F | Ht 68.0 in | Wt 188.7 lb

## 2019-08-10 DIAGNOSIS — S61203A Unspecified open wound of left middle finger without damage to nail, initial encounter: Secondary | ICD-10-CM | POA: Diagnosis not present

## 2019-08-10 DIAGNOSIS — Z794 Long term (current) use of insulin: Secondary | ICD-10-CM

## 2019-08-10 DIAGNOSIS — E1169 Type 2 diabetes mellitus with other specified complication: Secondary | ICD-10-CM | POA: Diagnosis not present

## 2019-08-10 DIAGNOSIS — Z83438 Family history of other disorder of lipoprotein metabolism and other lipidemia: Secondary | ICD-10-CM

## 2019-08-10 DIAGNOSIS — M00042 Staphylococcal arthritis, left hand: Secondary | ICD-10-CM | POA: Diagnosis not present

## 2019-08-10 DIAGNOSIS — I10 Essential (primary) hypertension: Secondary | ICD-10-CM | POA: Diagnosis present

## 2019-08-10 DIAGNOSIS — Z79899 Other long term (current) drug therapy: Secondary | ICD-10-CM | POA: Diagnosis not present

## 2019-08-10 DIAGNOSIS — M009 Pyogenic arthritis, unspecified: Secondary | ICD-10-CM

## 2019-08-10 DIAGNOSIS — Z833 Family history of diabetes mellitus: Secondary | ICD-10-CM

## 2019-08-10 DIAGNOSIS — B950 Streptococcus, group A, as the cause of diseases classified elsewhere: Secondary | ICD-10-CM | POA: Diagnosis present

## 2019-08-10 DIAGNOSIS — T383X5A Adverse effect of insulin and oral hypoglycemic [antidiabetic] drugs, initial encounter: Secondary | ICD-10-CM | POA: Diagnosis present

## 2019-08-10 DIAGNOSIS — B958 Unspecified staphylococcus as the cause of diseases classified elsewhere: Secondary | ICD-10-CM

## 2019-08-10 DIAGNOSIS — E11649 Type 2 diabetes mellitus with hypoglycemia without coma: Secondary | ICD-10-CM | POA: Diagnosis present

## 2019-08-10 DIAGNOSIS — E160 Drug-induced hypoglycemia without coma: Secondary | ICD-10-CM | POA: Diagnosis not present

## 2019-08-10 DIAGNOSIS — F329 Major depressive disorder, single episode, unspecified: Secondary | ICD-10-CM | POA: Diagnosis present

## 2019-08-10 DIAGNOSIS — S6992XA Unspecified injury of left wrist, hand and finger(s), initial encounter: Secondary | ICD-10-CM | POA: Diagnosis not present

## 2019-08-10 DIAGNOSIS — L98499 Non-pressure chronic ulcer of skin of other sites with unspecified severity: Secondary | ICD-10-CM | POA: Diagnosis not present

## 2019-08-10 DIAGNOSIS — F419 Anxiety disorder, unspecified: Secondary | ICD-10-CM | POA: Diagnosis present

## 2019-08-10 DIAGNOSIS — Z20822 Contact with and (suspected) exposure to covid-19: Secondary | ICD-10-CM | POA: Diagnosis present

## 2019-08-10 DIAGNOSIS — Z882 Allergy status to sulfonamides status: Secondary | ICD-10-CM

## 2019-08-10 DIAGNOSIS — L089 Local infection of the skin and subcutaneous tissue, unspecified: Secondary | ICD-10-CM | POA: Diagnosis not present

## 2019-08-10 DIAGNOSIS — M868X4 Other osteomyelitis, hand: Secondary | ICD-10-CM | POA: Diagnosis not present

## 2019-08-10 DIAGNOSIS — M86142 Other acute osteomyelitis, left hand: Secondary | ICD-10-CM | POA: Diagnosis not present

## 2019-08-10 DIAGNOSIS — Z8042 Family history of malignant neoplasm of prostate: Secondary | ICD-10-CM

## 2019-08-10 DIAGNOSIS — Z8249 Family history of ischemic heart disease and other diseases of the circulatory system: Secondary | ICD-10-CM

## 2019-08-10 DIAGNOSIS — M00242 Other streptococcal arthritis, left hand: Secondary | ICD-10-CM | POA: Diagnosis present

## 2019-08-10 DIAGNOSIS — M869 Osteomyelitis, unspecified: Secondary | ICD-10-CM | POA: Diagnosis present

## 2019-08-10 DIAGNOSIS — E119 Type 2 diabetes mellitus without complications: Secondary | ICD-10-CM

## 2019-08-10 DIAGNOSIS — Z03818 Encounter for observation for suspected exposure to other biological agents ruled out: Secondary | ICD-10-CM | POA: Diagnosis not present

## 2019-08-10 DIAGNOSIS — Z888 Allergy status to other drugs, medicaments and biological substances status: Secondary | ICD-10-CM | POA: Diagnosis not present

## 2019-08-10 DIAGNOSIS — F17210 Nicotine dependence, cigarettes, uncomplicated: Secondary | ICD-10-CM | POA: Diagnosis present

## 2019-08-10 DIAGNOSIS — Z720 Tobacco use: Secondary | ICD-10-CM | POA: Diagnosis present

## 2019-08-10 LAB — COMPREHENSIVE METABOLIC PANEL
ALT: 34 U/L (ref 0–44)
AST: 33 U/L (ref 15–41)
Albumin: 4 g/dL (ref 3.5–5.0)
Alkaline Phosphatase: 80 U/L (ref 38–126)
Anion gap: 10 (ref 5–15)
BUN: 13 mg/dL (ref 6–20)
CO2: 27 mmol/L (ref 22–32)
Calcium: 9.2 mg/dL (ref 8.9–10.3)
Chloride: 100 mmol/L (ref 98–111)
Creatinine, Ser: 0.92 mg/dL (ref 0.61–1.24)
GFR calc Af Amer: >60 mL/min (ref >60)
GFR calc non Af Amer: >60 mL/min (ref >60)
Glucose, Bld: 66 mg/dL — ABNORMAL LOW (ref 70–99)
Potassium: 3.7 mmol/L (ref 3.5–5.1)
Sodium: 137 mmol/L (ref 135–145)
Total Bilirubin: 0.5 mg/dL (ref 0.3–1.2)
Total Protein: 7.1 g/dL (ref 6.5–8.1)

## 2019-08-10 LAB — CBC WITH DIFFERENTIAL/PLATELET
Abs Immature Granulocytes: 0.07 10*3/uL (ref 0.00–0.07)
Basophils Absolute: 0.1 10*3/uL (ref 0.0–0.1)
Basophils Relative: 1 %
Eosinophils Absolute: 0 10*3/uL (ref 0.0–0.5)
Eosinophils Relative: 0 %
HCT: 42.7 % (ref 39.0–52.0)
Hemoglobin: 14 g/dL (ref 13.0–17.0)
Immature Granulocytes: 1 %
Lymphocytes Relative: 28 %
Lymphs Abs: 3.7 10*3/uL (ref 0.7–4.0)
MCH: 29.3 pg (ref 26.0–34.0)
MCHC: 32.8 g/dL (ref 30.0–36.0)
MCV: 89.3 fL (ref 80.0–100.0)
Monocytes Absolute: 0.7 10*3/uL (ref 0.1–1.0)
Monocytes Relative: 5 %
Neutro Abs: 8.8 10*3/uL — ABNORMAL HIGH (ref 1.7–7.7)
Neutrophils Relative %: 65 %
Platelets: 295 10*3/uL (ref 150–400)
RBC: 4.78 MIL/uL (ref 4.22–5.81)
RDW: 14.5 % (ref 11.5–15.5)
WBC: 13.3 10*3/uL — ABNORMAL HIGH (ref 4.0–10.5)
nRBC: 0 % (ref 0.0–0.2)

## 2019-08-10 LAB — PROTIME-INR
INR: 1 (ref 0.8–1.2)
Prothrombin Time: 12.3 seconds (ref 11.4–15.2)

## 2019-08-10 LAB — URINALYSIS, ROUTINE W REFLEX MICROSCOPIC
Bilirubin Urine: NEGATIVE
Glucose, UA: NEGATIVE mg/dL
Hgb urine dipstick: NEGATIVE
Ketones, ur: NEGATIVE mg/dL
Leukocytes,Ua: NEGATIVE
Nitrite: NEGATIVE
Protein, ur: NEGATIVE mg/dL
Specific Gravity, Urine: 1.004 — ABNORMAL LOW (ref 1.005–1.030)
pH: 6 (ref 5.0–8.0)

## 2019-08-10 LAB — LACTIC ACID, PLASMA: Lactic Acid, Venous: 1.1 mmol/L (ref 0.5–1.9)

## 2019-08-10 MED ORDER — VANCOMYCIN HCL IN DEXTROSE 1-5 GM/200ML-% IV SOLN
1000.0000 mg | Freq: Once | INTRAVENOUS | Status: AC
  Administered 2019-08-11: 1000 mg via INTRAVENOUS
  Filled 2019-08-10: qty 200

## 2019-08-10 MED ORDER — FENTANYL CITRATE (PF) 100 MCG/2ML IJ SOLN
50.0000 ug | Freq: Once | INTRAMUSCULAR | Status: AC
  Administered 2019-08-11: 50 ug via INTRAVENOUS
  Filled 2019-08-10: qty 2

## 2019-08-10 MED ORDER — PIPERACILLIN-TAZOBACTAM 3.375 G IVPB 30 MIN
3.3750 g | Freq: Once | INTRAVENOUS | Status: AC
  Administered 2019-08-11: 3.375 g via INTRAVENOUS
  Filled 2019-08-10: qty 50

## 2019-08-10 NOTE — Patient Instructions (Signed)
Osteomyelitis, Adult  Bone infections (osteomyelitis) occur when bacteria or other germs get inside a bone. This can happen if you have an infection in another part of your body that spreads through your blood. Germs from your skin or from outside of your body can also cause this type of infection if you have a wound or a broken bone (fracture) that breaks the skin. Bone infections need to be treated quickly to prevent bone damage and to prevent the infection from spreading to other areas of your body. What are the causes? Most bone infections are caused by bacteria. They can also be caused by other germs, such as viruses and funguses. What increases the risk? You are more likely to develop this condition if you:  Recently had surgery, especially bone or joint surgery.  Have a long-term (chronic) disease, such as: ? Diabetes. ? HIV (human immunodeficiency virus). ? Rheumatoid arthritis. ? Sickle cell anemia. ? Kidney disease that requires dialysis.  Are aged 60 years or older.  Have a condition or take medicines that block or weaken your body's defense system (immune system).  Have a condition that reduces your blood flow.  Have an artificial joint.  Have had a joint or bone repaired with plates or screws (surgical hardware).  Use IV drugs.  Have a central line for IV access.  Have had trauma, such as stepping on a nail or a broken bone that came through the skin. What are the signs or symptoms? Symptoms vary depending on the type and location of your infection. Common symptoms of bone infections include:  Fever and chills.  Skin redness and warmth.  Swelling.  Pain and stiffness.  Drainage of fluid or pus near the infection. How is this diagnosed? This condition may be diagnosed based on:  Your symptoms and medical history.  A physical exam.  Tests, such as: ? A sample of tissue, fluid, or blood taken to be examined under a microscope. ? Pus or discharge swabbed  from a wound for testing to identify germs and to determine what type of medicine will kill them (culture and sensitivity). ? Blood tests.  Imaging studies. These may include: ? X-rays. ? MRI. ? CT scan. ? Bone scan. ? Ultrasound. How is this treated? Treatment for this condition depends on the cause and type of infection. Antibiotic medicines are usually the first treatment for a bone infection. This may be done in a hospital at first. You may have to continue IV antibiotics at home or take antibiotics by mouth for several weeks after that. Other treatments may include surgery to remove:  Dead or dying tissue from a bone.  An infected artificial joint.  Infected plates or screws that were used to repair a broken bone. Follow these instructions at home: Medicines   Take over-the-counter and prescription medicines only as told by your health care provider.  Take your antibiotic medicine as told by your health care provider. Do not stop taking the antibiotic even if you start to feel better.  Follow instructions from your health care provider about how to take IV antibiotics at home. You may need to have a nurse come to your home to give you the IV antibiotics. General instructions   Ask your health care provider if you have any restrictions on your activities.  If directed, put ice on the affected area: ? Put ice in a plastic bag. ? Place a towel between your skin and the bag. ? Leave the ice on for 20   minutes, 2-3 times a day.  Wash your hands often with soap and water. If soap and water are not available, use hand sanitizer.  Do not use any products that contain nicotine or tobacco, such as cigarettes and e-cigarettes. These can delay bone healing. If you need help quitting, ask your health care provider.  Keep all follow-up visits as told by your health care provider. This is important. Contact a health care provider if:  You develop a fever or chills.  You have  redness, warmth, pain, or swelling that returns after treatment. Get help right away if:  You have rapid breathing or you have trouble breathing.  You have chest pain.  You cannot drink fluids or make urine.  The affected area swells, changes color, or turns blue.  You have numbness or severe pain in the affected area. Summary  Bone infections (osteomyelitis) occur when bacteria or other germs get inside a bone.  You may be more likely to get this type of infection if you have a condition, such as diabetes, that lowers your ability to fight infection or increases your chances of getting an infection.  Most bone infections are caused by bacteria. They can also be caused by other germs, such as viruses and funguses.  Treatment for this condition usually starts with taking antibiotics. Further treatment depends on the cause and type of infection. This information is not intended to replace advice given to you by your health care provider. Make sure you discuss any questions you have with your health care provider. Document Revised: 03/10/2017 Document Reviewed: 03/03/2017 [employer] Patient Education  2020 [employer]  

## 2019-08-10 NOTE — ED Provider Notes (Signed)
[facility] EMERGENCY DEPARTMENT Provider Note   CSN: [id number] Arrival date & time: 08/10/19  1613     History Chief Complaint  Patient presents with  . Osteomyelitis     Larry Lawson is a 49 y.o. male.  HPI   Patient presents to the emergency department with chief complaint of left middle finger pain.  Patient was sent from his primary care office who sent him here for further evaluation of osteomyelitis of the distal aspect of the third middle phalanx.  Patient had x-rays done of the hand which showed new third DIP joint septic arthritis and osteomyelitis involving the periarticular third middle and distal phalanx.  Patient states that he injured his hand about a month ago and has been trying to take care of it with ointments and bandages.  Patient recently bumped his finger on a box 2 days ago and resulted in extreme pain of his finger.  He states that the tip of his finger feels loose and he can wiggle it from side to side.  He admits that there is drainage coming from his finger.  Patient has not eaten or drank since 10:30 PM on Friday. Patient has significant medical history of diabetes, heart murmur, hypertension.  Patient states that he is compliant on his diabetic medication.  Patient denies fever, chills, headache, sore throat, chest pain, shortness of breath, nausea, vomiting, diarrhea, pedal edema.  Past Medical History:  Diagnosis Date  . Diabetes mellitus   . Heart murmur   . Heart murmur   . Hypertension     Patient Active Problem List   Diagnosis Date Noted  . Osteomyelitis of finger of left hand (HCC) 08/10/2019  . Acute hepatitis A 03/20/2019  . Anxiety 10/24/2018  . Acute hypoxemic respiratory failure (HCC) 03/05/2018  . Erectile dysfunction 05/21/2015  . Lumbar pain with radiation down left leg 04/15/2015  . Trigger thumb of left hand 04/15/2015  . Hyperlipemia 04/11/2015  . Exposure to hepatitis C 06/25/2014  . Type 2 diabetes mellitus  with complication (HCC) 06/21/2014  . Tobacco abuse 09/12/2012  . Depression 03/17/2011  . OBESITY NOS 05/04/2006  . HYPERTENSION, BENIGN ESSENTIAL 05/04/2006    History reviewed. No pertinent surgical history.     Family History  Problem Relation Age of Onset  . Hyperlipidemia Mother   . Heart failure Mother   . Hypertension Father   . Prostate cancer Father   . Cancer Other        grandmother  . Diabetes Brother   . Heart attack Other        grandfather  . Heart attack Brother 46  . Heart failure Brother   . Heart disease Paternal Uncle     Social History   Tobacco Use  . Smoking status: Light Tobacco Smoker    Packs/day: 0.50  . Smokeless tobacco: Current User  Substance Use Topics  . Alcohol use: Yes  . Drug use: No    Home Medications Prior to Admission medications   Medication Sig Start Date End Date Taking? Authorizing Provider  albuterol (VENTOLIN HFA) 108 (90 Base) MCG/ACT inhaler INHALE 2 PUFFS INTO THE LUNGS EVERY 6 HOURS AS NEEDED FOR WHEEZING. Patient taking differently: Inhale 2 puffs into the lungs every 6 (six) hours as needed for wheezing or shortness of breath. INHALE 2 PUFFS INTO THE LUNGS EVERY 6 HOURS AS NEEDED FOR WHEEZING. 03/07/19   Agapito Games, MD  AMBULATORY NON FORMULARY MEDICATION Medication Name: Glucometer with strips  and lancets to check daily.  Dx: Diabetes 250.00 06/21/14   Breeback, Jade L, PA-C  amLODipine (NORVASC) 10 MG tablet Take 1 tablet (10 mg total) by mouth daily. 07/19/19   Agapito Games, MD  amoxicillin-clavulanate (AUGMENTIN) 875-125 MG tablet Take 1 tablet by mouth 2 (two) times daily. 07/19/19   Agapito Games, MD  atorvastatin (LIPITOR) 40 MG tablet TAKE 1 TABLET BY MOUTH EVERY DAY Patient taking differently: Take 40 mg by mouth daily at 6 PM.  12/27/18   Agapito Games, MD  clonazePAM (KLONOPIN) 0.5 MG tablet TAKE 1 TABLET (0.5 MG TOTAL) BY MOUTH DAILY AS NEEDED FOR ANXIETY. THIS IS A 30  DAY SUPPLY 04/23/19   Agapito Games, MD  FLUoxetine (PROZAC) 40 MG capsule TAKE 1 CAPSULE BY MOUTH EVERY DAY Patient taking differently: Take 40 mg by mouth daily.  01/04/19   Agapito Games, MD  ibuprofen (ADVIL) 600 MG tablet Take 1 tablet (600 mg total) by mouth every 8 (eight) hours as needed. 07/16/19   Agapito Games, MD  Insulin Pen Needle (B-D ULTRAFINE III SHORT PEN) 31G X 8 MM MISC USE  TO ADMINISTER INSULIN, once daily. Dx E11.8 07/23/19   Agapito Games, MD  JANUMET XR 50-1000 MG TB24 TAKE 1 TABLET BY MOUTH EVERY DAY 02/27/19   Agapito Games, MD  losartan-hydrochlorothiazide [facility]) 100-25 MG tablet TAKE 1 TABLET BY MOUTH EVERY DAY 07/30/19   Agapito Games, MD  sildenafil (REVATIO) 20 MG tablet TAKE 2 TO 5 TABLETS BY MOUTH ONCE DAILY AS NEEDED 04/24/19   Agapito Games, MD  TOUJEO SOLOSTAR 300 UNIT/ML Solostar Pen INJECT 36 UNITS INTO THE SKIN AT BEDTIME. 06/11/19   Agapito Games, MD  zolpidem (AMBIEN) 10 MG tablet TAKE 1 TABLET BY MOUTH AT BEDTIME AS NEEDED FOR SLEEP. 05/03/19   Agapito Games, MD    Allergies    Gabapentin and Sulfonamide derivatives  Review of Systems   Review of Systems  Constitutional: Negative for chills and fever.  HENT: Negative for congestion and sore throat.   Eyes: Negative for visual disturbance.  Respiratory: Negative for shortness of breath.   Cardiovascular: Negative for chest pain.  Gastrointestinal: Negative for abdominal pain, diarrhea and vomiting.  Genitourinary: Negative for enuresis and flank pain.  Musculoskeletal: Negative for back pain.       Patient amitts to left third finger pain and pustulant drainage coming from the tip of his finger unable to bend it at the DIP joint.  Skin: Negative for rash.  Neurological: Negative for dizziness, numbness and headaches.  Hematological: Does not bruise/bleed easily.    Physical Exam Updated Vital Signs BP (!) 144/74 (BP Location:  Left Arm)   Pulse 68   Temp 98.8 F (37.1 C) (Oral)   Resp 19   Ht 5\' 9"  (1.753 m)   Wt 85.3 kg   SpO2 99%   BMI 27.76 kg/m   Physical Exam Vitals and nursing note reviewed.  Constitutional:      General: He is not in acute distress.    Appearance: He is not ill-appearing.  HENT:     Head: Normocephalic and atraumatic.     Nose: No congestion.     Mouth/Throat:     Mouth: Mucous membranes are moist.     Pharynx: Oropharynx is clear.  Eyes:     General: No scleral icterus. Cardiovascular:     Rate and Rhythm: Normal rate and regular rhythm.  Pulses: Normal pulses.     Heart sounds: No murmur. No friction rub. No gallop.   Pulmonary:     Effort: No respiratory distress.     Breath sounds: No wheezing, rhonchi or rales.  Abdominal:     General: There is no distension.     Palpations: Abdomen is soft.     Tenderness: There is no abdominal tenderness. There is no guarding.  Musculoskeletal:        General: No swelling.     Comments: On the left distal third middle phalanx there is purulent discharge coming from the DIP joint.  Finger is discolored and erythematous.  Tender to the touch, no cap refill, warm, unable to articulate at the DIP joint.  There was no rash, abnormalities noted on the patient's left hand.  Skin:    General: Skin is warm and dry.     Findings: No rash.  Neurological:     Mental Status: He is alert.  Psychiatric:        Mood and Affect: Mood normal.     ED Results / Procedures / Treatments   Labs (all labs ordered are listed, but only abnormal results are displayed) Labs Reviewed  COMPREHENSIVE METABOLIC PANEL - Abnormal; Notable for the following components:      Result Value   Glucose, Bld 66 (*)    All other components within normal limits  CBC WITH DIFFERENTIAL/PLATELET - Abnormal; Notable for the following components:   WBC 13.3 (*)    Neutro Abs 8.8 (*)    All other components within normal limits  URINALYSIS, ROUTINE W REFLEX  MICROSCOPIC - Abnormal; Notable for the following components:   Color, Urine STRAW (*)    Specific Gravity, Urine 1.004 (*)    All other components within normal limits  CULTURE, BLOOD (ROUTINE X 2)  CULTURE, BLOOD (ROUTINE X 2)  SARS CORONAVIRUS 2 BY RT PCR (HOSPITAL ORDER, PERFORMED IN [facility])  LACTIC ACID, PLASMA  PROTIME-INR  LACTIC ACID, PLASMA    EKG None  Radiology DG Hand Complete Left  Result Date: 08/10/2019 CLINICAL DATA:  Long finger wound. EXAM: LEFT HAND - COMPLETE 3+ VIEW COMPARISON:  CT left hand dated Jul 15, 2019. Left hand x-rays dated June 21, 2019. FINDINGS: New osseous destruction of the third middle phalanx head and base of the third distal phalanx with surrounding soft tissue swelling. Widenign of the third DIP joint space narrowing on the lateral view. Remaining joint spaces are preserved. IMPRESSION: 1. New third DIP joint septic arthritis and osteomyelitis involving the peri-articular third middle and distal phalanges. These results were called by telephone at the time of interpretation on 08/10/2019 at 3:00 pm to provider [facility], who verbally acknowledged these results. Electronically Signed   By: Obie Dredge M.D.   On: 08/10/2019 15:36    Procedures Procedures (including critical care time)  Medications Ordered in ED Medications  piperacillin-tazobactam (ZOSYN) IVPB 3.375 g (has no administration in time range)  vancomycin (VANCOCIN) IVPB 1000 mg/200 mL premix (has no administration in time range)  fentaNYL (SUBLIMAZE) injection 50 mcg (has no administration in time range)    ED Course  I have reviewed the triage vital signs and the nursing notes.  Pertinent labs & imaging results that were available during my care of the patient were reviewed by me and considered in my medical decision making (see chart for details).    MDM Rules/Calculators/A&P  I have personally reviewed all imaging, labs and  have interpreted them.  Due to patient's physical exam findings and radiology imaging I am concerned that the patient has osteomyelitis of the left distal third phalanx.  Patient was given fentanyl for pain and started on Zosyn and vancomycin for broad-spectrum coverage of osteomyelitis.  Consulted hand surgery Dr. Fredna Dow who will take the patient to surgery at 7 AM today.  Instructed that the patient is n.p.o. and continue with IV antibiotics as well as pain management.  We will admit him to hospitalist for continuing treatment.  CMP does not show any electrolyte abnormalities, AKI, elevated liver enzymes.  INR did not show abnormalities, leukocytosis of 13.3 with a elevated neutrophil count of 8.8 patient will be admitted to the hospitalist team I have spoken with Dr.Rathore who will take over patient care.  Final Clinical Impression(s) / ED Diagnoses Final diagnoses:  None    Rx / DC Orders ED Discharge Orders    None       Marcello Fennel, PA-C 08/11/19 0121    Ripley Fraise, MD 08/11/19 [phone]

## 2019-08-10 NOTE — Assessment & Plan Note (Signed)
Larry Lawson has had a finger sore for over 4 weeks now. It recently worsened, it feels unstable after mild trauma. X-rays do look like complete obliteration of the distal aspect of the third middle phalanx, likely consistent with an osteomyelitis. I do think he is going to lose the tip of this finger, I would like him to proceed to [facility], ED.

## 2019-08-10 NOTE — Progress Notes (Signed)
    Procedures performed today:    None.  Independent interpretation of notes and tests performed by another provider:   X-rays personally reviewed, I do think that there is a pathologic fracture secondary to osteomyelitis to the third middle phalanx on the left hand.  I discussed this with radiology and they agree  Brief History, Exam, Impression, and Recommendations:    Osteomyelitis of finger of left hand (HCC) Larry Lawson has had a finger sore for over 4 weeks now. It recently worsened, it feels unstable after mild trauma. X-rays do look like complete obliteration of the distal aspect of the third middle phalanx, likely consistent with an osteomyelitis. I do think he is going to lose the tip of this finger, I would like him to proceed to [facility], ED.    ___________________________________________ [facility]. Benjamin Stain, M.D., ABFM., CAQSM. Primary Care and Sports Medicine [facility]  Adjunct Instructor of Family Medicine  [employer]

## 2019-08-10 NOTE — ED Triage Notes (Signed)
Pt arrives POV for eval of osteomyelitis to L middle finger. Initial injury 1 month ago, got xray this week displaying osteo. Denies fevers/chills. States they sent him here for an amp

## 2019-08-10 NOTE — Progress Notes (Signed)
Acute Office Visit  Subjective:    Patient ID: [facility], male    DOB: Jul 23, 1970, 49 y.o.   MRN: [id number]  Chief Complaint  Patient presents with  . Finger Injury    left middle finger, [place] is montioring wound, was moving a box and bent finger, thinks it may be broken, has not changed bandage on finger    HPI Patient is in today for possible broken distal joint of the left middle finger. He currently has a wound on this finger that is being managed by his PCP. He has been placing dressings with duoderm and gauze wrap over the wound. He reports he was lifting a box day before yesterday. While he was lifting, the bandage on his finger was caught on the box and pulled on his finger. He reports when the finger pulled it appeared bent and was very painful. He tried to straighten the finger on his own, but it was so painful he felt he needed to have it evaluated. The pain is 10/10 and his finger is tingling. He reports that he has not removed the bandage because he was afraid of what it might look like.  Past Medical History:  Diagnosis Date  . Diabetes mellitus   . Heart murmur   . Heart murmur   . Hypertension     History reviewed. No pertinent surgical history.  Family History  Problem Relation Age of Onset  . Hyperlipidemia Mother   . Heart failure Mother   . Hypertension Father   . Prostate cancer Father   . Cancer Other        grandmother  . Diabetes Brother   . Heart attack Other        grandfather  . Heart attack Brother 52  . Heart failure Brother   . Heart disease Paternal Uncle     Social History   Socioeconomic History  . Marital status: Married    Spouse name: Not on file  . Number of children: Not on file  . Years of education: Not on file  . Highest education level: Not on file  Occupational History  . Not on file  Tobacco Use  . Smoking status: Light Tobacco Smoker    Packs/day: 0.50  . Smokeless tobacco: Current User  Substance and Sexual  Activity  . Alcohol use: Yes  . Drug use: No  . 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  Other Topics Concern  . Not on file  Social History Narrative  . Not on file   Social Determinants of Health   Financial Resource Strain:   . Difficulty of Paying Living Expenses:   Food Insecurity:   . Worried About Charity fundraiser in the Last Year:   . Arboriculturist in the Last Year:   Transportation Needs:   . Film/video editor (Medical):   Marland Kitchen Lack of Transportation (Non-Medical):   Physical Activity:   . Days of Exercise per Week:   . Minutes of Exercise per Session:   Stress:   . Feeling of Stress :   Social Connections:   . Frequency of Communication with Friends and Family:   . Frequency of Social Gatherings with Friends and Family:   . Attends Religious Services:   . Active Member of Clubs or Organizations:   . Attends Archivist Meetings:   Marland Kitchen Marital Status:   Intimate Production manager  Violence:   . Fear of Current or Ex-Partner:   . Emotionally Abused:   Marland Kitchen Physically Abused:   . Sexually Abused:     Outpatient Medications Prior to Visit  Medication Sig Dispense Refill  . albuterol (VENTOLIN HFA) 108 (90 Base) MCG/ACT inhaler INHALE 2 PUFFS INTO THE LUNGS EVERY 6 HOURS AS NEEDED FOR WHEEZING. (Patient taking differently: Inhale 2 puffs into the lungs every 6 (six) hours as needed for wheezing or shortness of breath. INHALE 2 PUFFS INTO THE LUNGS EVERY 6 HOURS AS NEEDED FOR WHEEZING.) 18 g 5  . AMBULATORY NON FORMULARY MEDICATION Medication Name: Glucometer with strips and lancets to check daily.  Dx: Diabetes 250.00 1 Units 3  . amLODipine (NORVASC) 10 MG tablet Take 1 tablet (10 mg total) by mouth daily. 90 tablet 1  . amoxicillin-clavulanate (AUGMENTIN) 875-125 MG tablet Take 1 tablet by mouth 2 (two) times daily. 20 tablet 0  . atorvastatin (LIPITOR) 40 MG tablet TAKE 1 TABLET BY  MOUTH EVERY DAY (Patient taking differently: Take 40 mg by mouth daily at 6 PM. ) 90 tablet 2  . clonazePAM (KLONOPIN) 0.5 MG tablet TAKE 1 TABLET (0.5 MG TOTAL) BY MOUTH DAILY AS NEEDED FOR ANXIETY. THIS IS A 30 DAY SUPPLY 20 tablet 0  . FLUoxetine (PROZAC) 40 MG capsule TAKE 1 CAPSULE BY MOUTH EVERY DAY (Patient taking differently: Take 40 mg by mouth daily. ) 90 capsule 1  . ibuprofen (ADVIL) 600 MG tablet Take 1 tablet (600 mg total) by mouth every 8 (eight) hours as needed. 30 tablet 0  . Insulin Pen Needle (B-D ULTRAFINE III SHORT PEN) 31G X 8 MM MISC USE  TO ADMINISTER INSULIN, once daily. Dx E11.8 100 each 11  . JANUMET XR 50-1000 MG TB24 TAKE 1 TABLET BY MOUTH EVERY DAY 30 tablet 5  . losartan-hydrochlorothiazide (HYZAAR) 100-25 MG tablet TAKE 1 TABLET BY MOUTH EVERY DAY 90 tablet 1  . sildenafil (REVATIO) 20 MG tablet TAKE 2 TO 5 TABLETS BY MOUTH ONCE DAILY AS NEEDED 50 tablet 5  . TOUJEO SOLOSTAR 300 UNIT/ML Solostar Pen INJECT 36 UNITS INTO THE SKIN AT BEDTIME. 15 pen 1  . zolpidem (AMBIEN) 10 MG tablet TAKE 1 TABLET BY MOUTH AT BEDTIME AS NEEDED FOR SLEEP. 30 tablet 3   No facility-administered medications prior to visit.    Allergies  Allergen Reactions  . Gabapentin Other (See Comments)    Drowsiness   . Sulfonamide Derivatives     REACTION: pt. can't remember reaction    Review of Systems  Constitutional: Negative for activity change, appetite change, chills, fatigue, fever and unexpected weight change.  Musculoskeletal: Positive for joint swelling.  Skin: Positive for color change and wound.  All other systems reviewed and are negative.      Objective:    Physical Exam Vitals and nursing note reviewed.  Constitutional:      Appearance: Normal appearance.  HENT:     Head: Normocephalic.  Eyes:     Extraocular Movements: Extraocular movements intact.     Conjunctiva/sclera: Conjunctivae normal.     Pupils: Pupils are equal, round, and reactive to light.   Cardiovascular:     Rate and Rhythm: Normal rate and regular rhythm.     Pulses: Normal pulses.  Pulmonary:     Effort: Pulmonary effort is normal.  Musculoskeletal:        General: Swelling, tenderness, deformity and signs of injury present.     Right hand: Normal.  Left hand: Swelling, deformity and tenderness present. Decreased range of motion. Decreased strength. Normal sensation. Normal capillary refill. Normal pulse.     Cervical back: Normal range of motion.     Comments: Wound with two openings and visible white and yellow exudate between the proximal and distal joint of the middle left finger.  The distal portion of the left middle finger is bent at approximately 10 degrees to the left, the skin is erythematous and ecchymotic. Edema is located proximally and notable where the bandage was wrapped around the finger to protect the wound.   Skin:    General: Skin is warm and dry.     Capillary Refill: Capillary refill takes less than 2 seconds.  Neurological:     General: No focal deficit present.     Mental Status: He is alert and oriented to person, place, and time.  Psychiatric:        Mood and Affect: Mood normal.        Behavior: Behavior normal.        Thought Content: Thought content normal.        Judgment: Judgment normal.     BP 130/82   Pulse 82   Temp 98.5 F (36.9 C) (Oral)   Ht 5\' 8"  (1.727 m)   Wt 188 lb 11.2 oz (85.6 kg)   SpO2 97%   BMI 28.69 kg/m  Wt Readings from Last 3 Encounters:  08/10/19 188 lb 11.2 oz (85.6 kg)  07/23/19 190 lb (86.2 kg)  07/19/19 194 lb (88 kg)    Health Maintenance Due  Topic Date Due  . OPHTHALMOLOGY EXAM  05/03/2013    There are no preventive care reminders to display for this patient.   Lab Results  Component Value Date   TSH 2.23 02/21/2019   Lab Results  Component Value Date   WBC 12.3 (H) 05/01/2019   HGB 14.6 05/01/2019   HCT 42.6 05/01/2019   MCV 86.8 05/01/2019   PLT 267 05/01/2019   Lab  Results  Component Value Date   NA 134 (L) 05/01/2019   K 4.0 05/01/2019   CO2 28 05/01/2019   GLUCOSE 181 (H) 05/01/2019   BUN 9 05/01/2019   CREATININE 1.20 05/01/2019   BILITOT 0.9 05/01/2019   ALKPHOS 454 (H) 03/14/2019   AST 17 05/01/2019   ALT 23 05/01/2019   PROT 6.7 05/01/2019   ALBUMIN 2.5 (L) 03/14/2019   CALCIUM 9.7 05/01/2019   ANIONGAP 7 03/14/2019   Lab Results  Component Value Date   CHOL 127 02/21/2019   Lab Results  Component Value Date   HDL 40 02/21/2019   Lab Results  Component Value Date   LDLCALC 74 02/21/2019   Lab Results  Component Value Date   TRIG 54 02/21/2019   Lab Results  Component Value Date   CHOLHDL 3.2 02/21/2019   Lab Results  Component Value Date   HGBA1C 7.4 (A) 07/23/2019       Assessment & Plan:   1. Injury of left middle finger, initial encounter Left middle finger erythematous and edematous with purulent drainage present from two wound openings at the distal joint. The most distal portion of the finger appears to be disconnected from the rest of the finger.  Obtained xray of left hand. Consult with Dr. 07/25/2019 with sports medicine to evaluate.  Xray reveals suspcted osteomyelitis of the distal phalangeal joint of the middle finger on the left hand.   PLAN: -Instructed patient to go to  the emergency room for evaluation and probable surgery to the affected finger.  -Explained that he will likely need surgical removal of the infected portion of the finger and he expressed understanding.  - DG Hand Complete Left   Tollie Eth, NP

## 2019-08-11 ENCOUNTER — Inpatient Hospital Stay (HOSPITAL_COMMUNITY): Payer: BC Managed Care – PPO | Admitting: Certified Registered"

## 2019-08-11 ENCOUNTER — Encounter (HOSPITAL_COMMUNITY)
Admission: EM | Disposition: A | Discharge status: Home / Self Care | Payer: Self-pay | Source: Ambulatory Visit | Attending: Internal Medicine

## 2019-08-11 DIAGNOSIS — Z882 Allergy status to sulfonamides status: Secondary | ICD-10-CM | POA: Diagnosis not present

## 2019-08-11 DIAGNOSIS — Z794 Long term (current) use of insulin: Secondary | ICD-10-CM | POA: Diagnosis not present

## 2019-08-11 DIAGNOSIS — T383X5A Adverse effect of insulin and oral hypoglycemic [antidiabetic] drugs, initial encounter: Secondary | ICD-10-CM | POA: Diagnosis present

## 2019-08-11 DIAGNOSIS — E11649 Type 2 diabetes mellitus with hypoglycemia without coma: Secondary | ICD-10-CM | POA: Diagnosis present

## 2019-08-11 DIAGNOSIS — Z8249 Family history of ischemic heart disease and other diseases of the circulatory system: Secondary | ICD-10-CM | POA: Diagnosis not present

## 2019-08-11 DIAGNOSIS — L98499 Non-pressure chronic ulcer of skin of other sites with unspecified severity: Secondary | ICD-10-CM | POA: Diagnosis not present

## 2019-08-11 DIAGNOSIS — Z79899 Other long term (current) drug therapy: Secondary | ICD-10-CM | POA: Diagnosis not present

## 2019-08-11 DIAGNOSIS — Z833 Family history of diabetes mellitus: Secondary | ICD-10-CM | POA: Diagnosis not present

## 2019-08-11 DIAGNOSIS — M869 Osteomyelitis, unspecified: Secondary | ICD-10-CM | POA: Diagnosis present

## 2019-08-11 DIAGNOSIS — I10 Essential (primary) hypertension: Secondary | ICD-10-CM | POA: Diagnosis present

## 2019-08-11 DIAGNOSIS — L089 Local infection of the skin and subcutaneous tissue, unspecified: Secondary | ICD-10-CM | POA: Diagnosis not present

## 2019-08-11 DIAGNOSIS — E160 Drug-induced hypoglycemia without coma: Secondary | ICD-10-CM | POA: Diagnosis not present

## 2019-08-11 DIAGNOSIS — E1169 Type 2 diabetes mellitus with other specified complication: Secondary | ICD-10-CM | POA: Diagnosis present

## 2019-08-11 DIAGNOSIS — E119 Type 2 diabetes mellitus without complications: Secondary | ICD-10-CM

## 2019-08-11 DIAGNOSIS — Z888 Allergy status to other drugs, medicaments and biological substances status: Secondary | ICD-10-CM | POA: Diagnosis not present

## 2019-08-11 DIAGNOSIS — F17210 Nicotine dependence, cigarettes, uncomplicated: Secondary | ICD-10-CM | POA: Diagnosis present

## 2019-08-11 DIAGNOSIS — Z03818 Encounter for observation for suspected exposure to other biological agents ruled out: Secondary | ICD-10-CM | POA: Diagnosis not present

## 2019-08-11 DIAGNOSIS — M86142 Other acute osteomyelitis, left hand: Secondary | ICD-10-CM | POA: Diagnosis not present

## 2019-08-11 DIAGNOSIS — M009 Pyogenic arthritis, unspecified: Secondary | ICD-10-CM

## 2019-08-11 DIAGNOSIS — Z20822 Contact with and (suspected) exposure to covid-19: Secondary | ICD-10-CM | POA: Diagnosis present

## 2019-08-11 DIAGNOSIS — Z83438 Family history of other disorder of lipoprotein metabolism and other lipidemia: Secondary | ICD-10-CM | POA: Diagnosis not present

## 2019-08-11 DIAGNOSIS — F329 Major depressive disorder, single episode, unspecified: Secondary | ICD-10-CM | POA: Diagnosis present

## 2019-08-11 DIAGNOSIS — Z8042 Family history of malignant neoplasm of prostate: Secondary | ICD-10-CM | POA: Diagnosis not present

## 2019-08-11 DIAGNOSIS — B950 Streptococcus, group A, as the cause of diseases classified elsewhere: Secondary | ICD-10-CM | POA: Diagnosis present

## 2019-08-11 DIAGNOSIS — M00242 Other streptococcal arthritis, left hand: Secondary | ICD-10-CM | POA: Diagnosis present

## 2019-08-11 DIAGNOSIS — F419 Anxiety disorder, unspecified: Secondary | ICD-10-CM | POA: Diagnosis present

## 2019-08-11 DIAGNOSIS — M00042 Staphylococcal arthritis, left hand: Secondary | ICD-10-CM | POA: Diagnosis not present

## 2019-08-11 DIAGNOSIS — M868X4 Other osteomyelitis, hand: Secondary | ICD-10-CM | POA: Diagnosis not present

## 2019-08-11 HISTORY — PX: I & D EXTREMITY: SHX5045

## 2019-08-11 LAB — CBC
HCT: 40.3 % (ref 39.0–52.0)
Hemoglobin: 13.1 g/dL (ref 13.0–17.0)
MCH: 29 pg (ref 26.0–34.0)
MCHC: 32.5 g/dL (ref 30.0–36.0)
MCV: 89.4 fL (ref 80.0–100.0)
Platelets: 243 10*3/uL (ref 150–400)
RBC: 4.51 MIL/uL (ref 4.22–5.81)
RDW: 14.6 % (ref 11.5–15.5)
WBC: 9.1 10*3/uL (ref 4.0–10.5)
nRBC: 0 % (ref 0.0–0.2)

## 2019-08-11 LAB — LACTIC ACID, PLASMA: Lactic Acid, Venous: 0.9 mmol/L (ref 0.5–1.9)

## 2019-08-11 LAB — SARS CORONAVIRUS 2 BY RT PCR (HOSPITAL ORDER, PERFORMED IN ~~LOC~~ HOSPITAL LAB): SARS Coronavirus 2: NEGATIVE

## 2019-08-11 LAB — GLUCOSE, CAPILLARY
Glucose-Capillary: 144 mg/dL — ABNORMAL HIGH (ref 70–99)
Glucose-Capillary: 148 mg/dL — ABNORMAL HIGH (ref 70–99)
Glucose-Capillary: 205 mg/dL — ABNORMAL HIGH (ref 70–99)
Glucose-Capillary: 210 mg/dL — ABNORMAL HIGH (ref 70–99)
Glucose-Capillary: 250 mg/dL — ABNORMAL HIGH (ref 70–99)
Glucose-Capillary: 87 mg/dL (ref 70–99)

## 2019-08-11 LAB — SURGICAL PCR SCREEN
MRSA, PCR: NEGATIVE
Staphylococcus aureus: NEGATIVE

## 2019-08-11 LAB — HIV ANTIBODY (ROUTINE TESTING W REFLEX): HIV Screen 4th Generation wRfx: NONREACTIVE

## 2019-08-11 LAB — CBG MONITORING, ED: Glucose-Capillary: 85 mg/dL (ref 70–99)

## 2019-08-11 SURGERY — IRRIGATION AND DEBRIDEMENT EXTREMITY
Anesthesia: General | Site: Hand | Laterality: Left

## 2019-08-11 MED ORDER — BUPIVACAINE HCL (PF) 0.25 % IJ SOLN
INTRAMUSCULAR | Status: DC | PRN
  Administered 2019-08-11: 10 mL

## 2019-08-11 MED ORDER — PHENYLEPHRINE HCL (PRESSORS) 10 MG/ML IV SOLN
INTRAVENOUS | Status: DC | PRN
  Administered 2019-08-11 (×8): 80 ug via INTRAVENOUS

## 2019-08-11 MED ORDER — VANCOMYCIN HCL IN DEXTROSE 1-5 GM/200ML-% IV SOLN
1000.0000 mg | Freq: Three times a day (TID) | INTRAVENOUS | Status: DC
  Administered 2019-08-11 – 2019-08-12 (×4): 1000 mg via INTRAVENOUS
  Filled 2019-08-11 (×6): qty 200

## 2019-08-11 MED ORDER — ONDANSETRON HCL 4 MG/2ML IJ SOLN
INTRAMUSCULAR | Status: DC | PRN
  Administered 2019-08-11: 4 mg via INTRAVENOUS

## 2019-08-11 MED ORDER — DEXTROSE-NACL 5-0.9 % IV SOLN: INTRAVENOUS | Status: DC

## 2019-08-11 MED ORDER — ASCORBIC ACID 500 MG PO TABS
1000.0000 mg | ORAL_TABLET | Freq: Every day | ORAL | Status: DC
  Administered 2019-08-11 – 2019-08-12 (×2): 1000 mg via ORAL
  Filled 2019-08-11 (×2): qty 2

## 2019-08-11 MED ORDER — NICOTINE 14 MG/24HR TD PT24
14.0000 mg | MEDICATED_PATCH | Freq: Every day | TRANSDERMAL | Status: DC
  Filled 2019-08-11: qty 1

## 2019-08-11 MED ORDER — FLUOXETINE HCL 20 MG PO CAPS
40.0000 mg | ORAL_CAPSULE | Freq: Every day | ORAL | Status: DC
  Administered 2019-08-11 – 2019-08-12 (×2): 40 mg via ORAL
  Filled 2019-08-11 (×2): qty 2

## 2019-08-11 MED ORDER — ACETAMINOPHEN 650 MG RE SUPP: 650.0000 mg | Freq: Four times a day (QID) | RECTAL | Status: DC | PRN

## 2019-08-11 MED ORDER — MIDAZOLAM HCL 2 MG/2ML IJ SOLN
INTRAMUSCULAR | Status: AC
  Filled 2019-08-11: qty 2

## 2019-08-11 MED ORDER — GLYCOPYRROLATE PF 0.2 MG/ML IJ SOSY
PREFILLED_SYRINGE | INTRAMUSCULAR | Status: DC | PRN
  Administered 2019-08-11: .2 mg via INTRAVENOUS

## 2019-08-11 MED ORDER — HYDROCODONE-ACETAMINOPHEN 5-325 MG PO TABS: ORAL_TABLET | ORAL | 0 refills | Status: DC

## 2019-08-11 MED ORDER — BUPIVACAINE HCL (PF) 0.25 % IJ SOLN
INTRAMUSCULAR | Status: AC
  Filled 2019-08-11: qty 30

## 2019-08-11 MED ORDER — ZOLPIDEM TARTRATE 5 MG PO TABS: 10.0000 mg | ORAL_TABLET | Freq: Every evening | ORAL | Status: DC | PRN

## 2019-08-11 MED ORDER — LACTATED RINGERS IV SOLN: INTRAVENOUS | Status: DC | PRN

## 2019-08-11 MED ORDER — EPHEDRINE SULFATE 50 MG/ML IJ SOLN
INTRAMUSCULAR | Status: DC | PRN
  Administered 2019-08-11 (×5): 5 mg via INTRAVENOUS

## 2019-08-11 MED ORDER — FENTANYL CITRATE (PF) 250 MCG/5ML IJ SOLN
INTRAMUSCULAR | Status: AC
  Filled 2019-08-11: qty 5

## 2019-08-11 MED ORDER — PROPOFOL 10 MG/ML IV BOLUS: INTRAVENOUS | Status: DC | PRN

## 2019-08-11 MED ORDER — AMLODIPINE BESYLATE 10 MG PO TABS
10.0000 mg | ORAL_TABLET | Freq: Every day | ORAL | Status: DC
  Administered 2019-08-11 – 2019-08-12 (×2): 10 mg via ORAL
  Filled 2019-08-11 (×2): qty 1

## 2019-08-11 MED ORDER — DOXYCYCLINE HYCLATE 50 MG PO CAPS
100.0000 mg | ORAL_CAPSULE | Freq: Two times a day (BID) | ORAL | 0 refills | Status: DC
Start: 2019-08-11 — End: 2019-08-12

## 2019-08-11 MED ORDER — ATORVASTATIN CALCIUM 40 MG PO TABS
40.0000 mg | ORAL_TABLET | Freq: Every day | ORAL | Status: DC
  Administered 2019-08-11 – 2019-08-12 (×2): 40 mg via ORAL
  Filled 2019-08-11 (×2): qty 1

## 2019-08-11 MED ORDER — PIPERACILLIN-TAZOBACTAM 3.375 G IVPB
3.3750 g | Freq: Three times a day (TID) | INTRAVENOUS | Status: DC
  Administered 2019-08-11 – 2019-08-12 (×3): 3.375 g via INTRAVENOUS
  Filled 2019-08-11 (×4): qty 50

## 2019-08-11 MED ORDER — ACETAMINOPHEN 325 MG PO TABS
650.0000 mg | ORAL_TABLET | Freq: Four times a day (QID) | ORAL | Status: DC | PRN
  Administered 2019-08-11 – 2019-08-12 (×2): 650 mg via ORAL
  Filled 2019-08-11 (×2): qty 2

## 2019-08-11 MED ORDER — PROPOFOL 10 MG/ML IV BOLUS
INTRAVENOUS | Status: DC | PRN
  Administered 2019-08-11: 250 mg via INTRAVENOUS
  Administered 2019-08-11: 40 mg via INTRAVENOUS

## 2019-08-11 MED ORDER — CLONAZEPAM 0.5 MG PO TABS: 0.5000 mg | ORAL_TABLET | Freq: Two times a day (BID) | ORAL | Status: DC | PRN

## 2019-08-11 MED ORDER — LACTATED RINGERS IV SOLN: INTRAVENOUS | Status: DC

## 2019-08-11 MED ORDER — 0.9 % SODIUM CHLORIDE (POUR BTL) OPTIME
TOPICAL | Status: DC | PRN
  Administered 2019-08-11: 1000 mL

## 2019-08-11 MED ORDER — FENTANYL CITRATE (PF) 100 MCG/2ML IJ SOLN
INTRAMUSCULAR | Status: DC | PRN
  Administered 2019-08-11 (×4): 50 ug via INTRAVENOUS

## 2019-08-11 MED ORDER — MIDAZOLAM HCL 5 MG/5ML IJ SOLN
INTRAMUSCULAR | Status: DC | PRN
  Administered 2019-08-11: 2 mg via INTRAVENOUS

## 2019-08-11 MED ORDER — LIDOCAINE 2% (20 MG/ML) 5 ML SYRINGE
INTRAMUSCULAR | Status: DC | PRN
  Administered 2019-08-11: 60 mg via INTRAVENOUS
  Administered 2019-08-11: 40 mg via INTRAVENOUS

## 2019-08-11 MED ORDER — MORPHINE SULFATE (PF) 2 MG/ML IV SOLN
1.0000 mg | INTRAVENOUS | Status: DC | PRN
  Administered 2019-08-11: 1 mg via INTRAVENOUS
  Filled 2019-08-11: qty 1

## 2019-08-11 MED ORDER — PROPOFOL 10 MG/ML IV BOLUS
INTRAVENOUS | Status: AC
  Filled 2019-08-11: qty 40

## 2019-08-11 SURGICAL SUPPLY — 42 items
BNDG CMPR 9X4 STRL LF SNTH (GAUZE/BANDAGES/DRESSINGS)
BNDG COHESIVE 1X5 TAN STRL LF (GAUZE/BANDAGES/DRESSINGS) ×1 IMPLANT
BNDG COHESIVE 2X5 TAN STRL LF (GAUZE/BANDAGES/DRESSINGS) IMPLANT
BNDG ELASTIC 3X5.8 VLCR STR LF (GAUZE/BANDAGES/DRESSINGS) ×2 IMPLANT
BNDG ELASTIC 4X5.8 VLCR STR LF (GAUZE/BANDAGES/DRESSINGS) ×2 IMPLANT
BNDG ESMARK 4X9 LF (GAUZE/BANDAGES/DRESSINGS) IMPLANT
BNDG GAUZE ELAST 4 BULKY (GAUZE/BANDAGES/DRESSINGS) ×2 IMPLANT
CORD BIPOLAR FORCEPS 12FT (ELECTRODE) ×2 IMPLANT
COVER SURGICAL LIGHT HANDLE (MISCELLANEOUS) ×2 IMPLANT
COVER WAND RF STERILE (DRAPES) ×2 IMPLANT
CUFF TOURN SGL QUICK 18X4 (TOURNIQUET CUFF) ×1 IMPLANT
DECANTER SPIKE VIAL GLASS SM (MISCELLANEOUS) ×2 IMPLANT
DRSG XEROFORM 1X8 (GAUZE/BANDAGES/DRESSINGS) ×1 IMPLANT
GAUZE SPONGE 4X4 12PLY STRL (GAUZE/BANDAGES/DRESSINGS) ×2 IMPLANT
GAUZE XEROFORM 1X8 LF (GAUZE/BANDAGES/DRESSINGS) ×2 IMPLANT
GLOVE BIO SURGEON STRL SZ7.5 (GLOVE) ×3 IMPLANT
GLOVE BIOGEL PI IND STRL 8 (GLOVE) ×1 IMPLANT
GLOVE BIOGEL PI INDICATOR 8 (GLOVE) ×3
GOWN STRL REUS W/ TWL LRG LVL3 (GOWN DISPOSABLE) ×1 IMPLANT
GOWN STRL REUS W/ TWL XL LVL3 (GOWN DISPOSABLE) ×1 IMPLANT
GOWN STRL REUS W/TWL LRG LVL3 (GOWN DISPOSABLE) ×2
GOWN STRL REUS W/TWL XL LVL3 (GOWN DISPOSABLE) ×2
KIT BASIN OR (CUSTOM PROCEDURE TRAY) ×2 IMPLANT
KIT TURNOVER KIT B (KITS) ×2 IMPLANT
LOOP VESSEL MAXI BLUE (MISCELLANEOUS) ×1 IMPLANT
NDL HYPO 25X1 1.5 SAFETY (NEEDLE) IMPLANT
NEEDLE HYPO 25X1 1.5 SAFETY (NEEDLE) IMPLANT
NS IRRIG 1000ML POUR BTL (IV SOLUTION) ×2 IMPLANT
PACK ORTHO EXTREMITY (CUSTOM PROCEDURE TRAY) ×2 IMPLANT
SOL PREP POV-IOD 4OZ 10% (MISCELLANEOUS) ×4 IMPLANT
SPONGE LAP 4X18 RFD (DISPOSABLE) ×2 IMPLANT
SUT ETHILON 4 0 PS 2 18 (SUTURE) IMPLANT
SUT MON AB 5-0 P3 18 (SUTURE) ×1 IMPLANT
SWAB COLLECTION DEVICE MRSA (MISCELLANEOUS) IMPLANT
SWAB CULTURE ESWAB REG 1ML (MISCELLANEOUS) IMPLANT
SYR 20ML LL LF (SYRINGE) ×2 IMPLANT
SYR CONTROL 10ML LL (SYRINGE) IMPLANT
TOWEL GREEN STERILE (TOWEL DISPOSABLE) ×2 IMPLANT
TUBE CONNECTING 12X1/4 (SUCTIONS) ×2 IMPLANT
TUBE FEEDING ENTERAL 5FR 16IN (TUBING) IMPLANT
UNDERPAD 30X36 HEAVY ABSORB (UNDERPADS AND DIAPERS) ×2 IMPLANT
YANKAUER SUCT BULB TIP NO VENT (SUCTIONS) ×2 IMPLANT

## 2019-08-11 NOTE — ED Provider Notes (Signed)
   Pt sent for evaluation for osteomyelitis of his finger. IV antibiotics have been ordered.  Pt will need admission.  Pt agreeable with plan   Patient gave verbal permission to utilize photo for medical documentation only The image was not stored on any personal device    Zadie Rhine, MD 08/11/19 0030

## 2019-08-11 NOTE — Transfer of Care (Signed)
Immediate Anesthesia Transfer of Care Note  Patient: Larry Lawson  Procedure(s) Performed: IRRIGATION AND DEBRIDEMENT LONG FINGER, AMPUTATION (Left Hand)  Patient Location: PACU  Anesthesia Type:General  Level of Consciousness: awake, alert , oriented and patient cooperative  Airway & Oxygen Therapy: Patient Spontanous Breathing  Post-op Assessment: Report given to RN and Post -op Vital signs reviewed and stable  Post vital signs: Reviewed and stable  Last Vitals:  Vitals Value Taken Time  BP 157/140 08/11/19 0912  Temp    Pulse 84 08/11/19 0912  Resp 18 08/11/19 0912  SpO2 96 % 08/11/19 0912  Vitals shown include unvalidated device data.  Last Pain:  Vitals:   08/11/19 0202  TempSrc: Oral  PainSc: 2       Patients Stated Pain Goal: 2 (08/11/19 0202)  Complications: No apparent anesthesia complications

## 2019-08-11 NOTE — Progress Notes (Signed)
Pt called reporting nurse and stated that he wanted to self discharge. Says that the doctor told him that he was getting discharged an hour after his long finger amputation this morning. No note entry regarding discharge from hand surgeon. Explained the cons of leaving AMA. Pt also irate at partner/girlfriend over the phone. Seemed to understand explanation given and changed his mind and decided to stay. hospitalist notified

## 2019-08-11 NOTE — Op Note (Signed)
NAME: Larry Lawson MEDICAL RECORD NO: [id number] DATE OF BIRTH: 12/09/1970 FACILITY: [facility] LOCATION: [facility] OR PHYSICIAN: Tennis Must, MD   OPERATIVE REPORT   DATE OF PROCEDURE: 08/11/19    PREOPERATIVE DIAGNOSIS:   Left long finger infection with osteomyelitis   POSTOPERATIVE DIAGNOSIS:   Left long finger infection with osteomyelitis   PROCEDURE:   Amputation left long finger through middle phalanx   SURGEON:  Leanora Cover, M.D.   ASSISTANT: none   ANESTHESIA:  General   INTRAVENOUS FLUIDS:  Per anesthesia flow sheet.   ESTIMATED BLOOD LOSS:  Minimal.   COMPLICATIONS:  None.   SPECIMENS:   Cultures to micro; finger to pathology for gross exam   TOURNIQUET TIME:    Total Tourniquet Time Documented: Upper Arm (Left) - 32 minutes Total: Upper Arm (Left) -32 minutes    DISPOSITION:  Stable to PACU.   INDICATIONS: 49 year old male states he has had infection of the left long finger over the past month.  This is progressively getting worse.  There is erythema swelling and pain.  There is a draining wound.  The end of the finger is unstable.   We discussed options including efforts to salvage the finger versus amputation.  He wishes to proceed with amputation.  Risks, benefits and alternatives of surgery were discussed including the risks of blood loss, infection, damage to nerves, vessels, tendons, ligaments, bone for surgery, need for additional surgery, complications with wound healing, continued pain, stiffness, continued infection with need for repeat irrigation debridement.  He voiced understanding of these risks and elected to proceed.  OPERATIVE COURSE:  After being identified preoperatively by myself,  the patient and I agreed on the procedure and site of the procedure.  The surgical site was marked.  Surgical consent had been signed. He was given IV antibiotics as preoperative antibiotic prophylaxis. He was transferred to the operating room and placed on the operating  table in supine position with the Left upper extremity on an arm board.  General anesthesia was induced by the anesthesiologist.  Left upper extremity was prepped and draped in normal sterile orthopedic fashion.  A surgical pause was performed between the surgeons, anesthesia, and operating room staff and all were in agreement as to the patient, procedure, and site of procedure.  Tourniquet at the proximal aspect of the extremity was inflated to 250 mmHg after exsanguination of the arm with an Esmarch bandage.    A fishmouth type incision was made over the finger preserving the volar tissue coverage.  The dorsal tissue was infected and the wound portion was removed.  Soft tissues right by spreading technique.  The radial and ulnar digital nerve and artery were identified.  They are placed under traction bipolar and allowed to retract.  The flexor tendons were sharply incised.  The extensor tendons were sharply incised.  The finger was amputated through the osteomyelitic portion of the middle phalanx.  The end of the finger was sent to pathology for gross exam.  Cultures were taken for aerobes and anaerobes.  Bone cultures were taken as well.  The rongeurs were used to remove infected bone from the middle phalanx back to hard bone and did not seem to be osteomyelitic.  The wound was copiously irrigated with sterile saline.  The volar flap was brought over the end of the bone and sutured to the dorsal skin.  5-0 Monocryl suture was used in interrupted fashion.  Good tension-free reapposition was obtained in the central portion.  The radial and ulnar sides were left open with a vessel loop drain to allow for drainage.  Digital block was performed with quarter percent plain Marcaine to aid in postoperative analgesia.  The wound was dressed with sterile Xeroform 4 x 4 and wrapped with Coban dressing lightly.  An AlumaFoam splint was placed and wrapped lightly with Coban dressing.  The tourniquet was deflated at 32  minutes.  Fingertips were pink with brisk capillary refill after deflation of tourniquet.  The operative  drapes were broken down.  The patient was awoken from anesthesia safely.  He was transferred back to the stretcher and taken to PACU in stable condition.  I will see him back in the office in 1 week for postoperative followup.  I will give him a prescription for Norco 5/325 1-2 tabs PO q6 hours prn pain, dispense # 20 doxycycline 100 mg p.o. twice daily x14 days.   Betha Loa, MD Electronically signed, 08/11/19

## 2019-08-11 NOTE — Consult Note (Signed)
Larry Lawson is an 49 y.o. male.   Chief Complaint: left long finger infection HPI: 49 yo male with 1 month of infection left long finger.  Started as blood blister that he popped.  Has progressively gotten more swollen and reddened.  Painful.  No fevers, chills, sweats.  Seen by PCP yesterday and sent to ED.  Case discussed with Berle Mull, [facility] and his note from 08/11/2019 reviewed. Xrays viewed and interpreted by me: 3 views left hand show osteomyelitis of condyles of left long finger middle phalanx and osteomyelitis of base of distal phalanx. Labs reviewed: WBC 9.1  Allergies:  Allergies  Allergen Reactions  . Gabapentin Other (See Comments)    Drowsiness   . Sulfonamide Derivatives     REACTION: pt. can't remember reaction    Past Medical History:  Diagnosis Date  . Diabetes mellitus   . Heart murmur   . Heart murmur   . Hypertension     History reviewed. No pertinent surgical history.  Family History: Family History  Problem Relation Age of Onset  . Hyperlipidemia Mother   . Heart failure Mother   . Hypertension Father   . Prostate cancer Father   . Cancer Other        grandmother  . Diabetes Brother   . Heart attack Other        grandfather  . Heart attack Brother 46  . Heart failure Brother   . Heart disease Paternal Uncle     Social History:   reports that he has been smoking. He has been smoking about 0.50 packs per day. He uses smokeless tobacco. He reports current alcohol use. He reports that he does not use drugs.  Medications: Medications Prior to Admission  Medication Sig Dispense Refill  . albuterol (VENTOLIN HFA) 108 (90 Base) MCG/ACT inhaler INHALE 2 PUFFS INTO THE LUNGS EVERY 6 HOURS AS NEEDED FOR WHEEZING. (Patient taking differently: Inhale 2 puffs into the lungs every 6 (six) hours as needed for wheezing or shortness of breath. INHALE 2 PUFFS INTO THE LUNGS EVERY 6 HOURS AS NEEDED FOR WHEEZING.) 18 g 5  . amLODipine (NORVASC) 10 MG tablet  Take 1 tablet (10 mg total) by mouth daily. 90 tablet 1  . atorvastatin (LIPITOR) 40 MG tablet TAKE 1 TABLET BY MOUTH EVERY DAY (Patient taking differently: Take 40 mg by mouth daily at 6 PM. ) 90 tablet 2  . clonazePAM (KLONOPIN) 0.5 MG tablet TAKE 1 TABLET (0.5 MG TOTAL) BY MOUTH DAILY AS NEEDED FOR ANXIETY. THIS IS A 30 DAY SUPPLY (Patient taking differently: Take 0.5 mg by mouth daily as needed for anxiety. ) 20 tablet 0  . FLUoxetine (PROZAC) 40 MG capsule TAKE 1 CAPSULE BY MOUTH EVERY DAY (Patient taking differently: Take 40 mg by mouth daily. ) 90 capsule 1  . ibuprofen (ADVIL) 600 MG tablet Take 1 tablet (600 mg total) by mouth every 8 (eight) hours as needed. (Patient taking differently: Take 600 mg by mouth every 8 (eight) hours as needed for moderate pain. ) 30 tablet 0  . JANUMET XR 50-1000 MG TB24 TAKE 1 TABLET BY MOUTH EVERY DAY (Patient taking differently: Take 1 tablet by mouth daily. ) 30 tablet 5  . losartan-hydrochlorothiazide (HYZAAR) 100-25 MG tablet TAKE 1 TABLET BY MOUTH EVERY DAY (Patient taking differently: Take 1 tablet by mouth daily. ) 90 tablet 1  . sildenafil (REVATIO) 20 MG tablet TAKE 2 TO 5 TABLETS BY MOUTH ONCE DAILY AS NEEDED (Patient taking  differently: Take 40-100 mg by mouth daily as needed (Erectile dysfunction.). ) 50 tablet 5  . TOUJEO SOLOSTAR 300 UNIT/ML Solostar Pen INJECT 36 UNITS INTO THE SKIN AT BEDTIME. (Patient taking differently: Inject 36 Units into the skin at bedtime. ) 15 pen 1  . zolpidem (AMBIEN) 10 MG tablet TAKE 1 TABLET BY MOUTH AT BEDTIME AS NEEDED FOR SLEEP. (Patient taking differently: Take 10 mg by mouth at bedtime as needed for sleep. ) 30 tablet 3  . AMBULATORY NON FORMULARY MEDICATION Medication Name: Glucometer with strips and lancets to check daily.  Dx: Diabetes 250.00 1 Units 3  . amoxicillin-clavulanate (AUGMENTIN) 875-125 MG tablet Take 1 tablet by mouth 2 (two) times daily. (Patient not taking: Reported on 08/11/2019) 20 tablet 0  .  Insulin Pen Needle (B-D ULTRAFINE III SHORT PEN) 31G X 8 MM MISC USE  TO ADMINISTER INSULIN, once daily. Dx E11.8 100 each 11    Results for orders placed or performed during the hospital encounter of 08/10/19 (from the past 48 hour(s))  Culture, blood (Routine x 2)     Status: None (Preliminary result)   Collection Time: 08/10/19  5:15 PM   Specimen: BLOOD  Result Value Ref Range   Specimen Description BLOOD RIGHT ANTECUBITAL    Special Requests      BOTTLES DRAWN AEROBIC AND ANAEROBIC Blood Culture adequate volume   Culture      NO GROWTH < 24 HOURS Performed at [facility], [address] [place], [place] [address]    Report Status PENDING   Comprehensive metabolic panel     Status: Abnormal   Collection Time: 08/10/19  5:16 PM  Result Value Ref Range   Sodium 137 135 - 145 mmol/L   Potassium 3.7 3.5 - 5.1 mmol/L   Chloride 100 98 - 111 mmol/L   CO2 27 22 - 32 mmol/L   Glucose, Bld 66 (L) 70 - 99 mg/dL    Comment: Glucose reference range applies only to samples taken after fasting for at least 8 hours.   BUN 13 6 - 20 mg/dL   Creatinine, Ser 6.040.92 0.61 - 1.24 mg/dL   Calcium 9.2 8.9 - 54.010.3 mg/dL   Total Protein 7.1 6.5 - 8.1 g/dL   Albumin 4.0 3.5 - 5.0 g/dL   AST 33 15 - 41 U/L   ALT 34 0 - 44 U/L   Alkaline Phosphatase 80 38 - 126 U/L   Total Bilirubin 0.5 0.3 - 1.2 mg/dL   GFR calc non Af Amer >60 >60 mL/min   GFR calc Af Amer >60 >60 mL/min   Anion gap 10 5 - 15    Comment: Performed at [facility], [address] [place], [place] [address]  Lactic acid, plasma     Status: None   Collection Time: 08/10/19  5:16 PM  Result Value Ref Range   Lactic Acid, Venous 1.1 0.5 - 1.9 mmol/L    Comment: Performed at [facility], [address] [place], [place] [address]  CBC with Differential     Status: Abnormal   Collection Time: 08/10/19  5:16 PM  Result Value Ref Range   WBC 13.3 (H) 4.0 - 10.5 K/uL   RBC 4.78 4.22 - 5.81 MIL/uL   Hemoglobin 14.0  13.0 - 17.0 g/dL   HCT 95.642.7 21.339.0 - 08.652.0 %   MCV 89.3 80.0 - 100.0 fL   MCH 29.3 26.0 - 34.0 pg   MCHC 32.8 30.0 -  36.0 g/dL   RDW 14.5 11.5 - 15.5 %   Platelets 295 150 - 400 K/uL   nRBC 0.0 0.0 - 0.2 %   Neutrophils Relative % 65 %   Neutro Abs 8.8 (H) 1.7 - 7.7 K/uL   Lymphocytes Relative 28 %   Lymphs Abs 3.7 0.7 - 4.0 K/uL   Monocytes Relative 5 %   Monocytes Absolute 0.7 0.1 - 1.0 K/uL   Eosinophils Relative 0 %   Eosinophils Absolute 0.0 0.0 - 0.5 K/uL   Basophils Relative 1 %   Basophils Absolute 0.1 0.0 - 0.1 K/uL   Immature Granulocytes 1 %   Abs Immature Granulocytes 0.07 0.00 - 0.07 K/uL    Comment: Performed at [facility] [address] [place], [place] [address]  Protime-INR     Status: None   Collection Time: 08/10/19  5:16 PM  Result Value Ref Range   Prothrombin Time 12.3 11.4 - 15.2 seconds   INR 1.0 0.8 - 1.2    Comment: (NOTE) INR goal varies based on device and disease states. Performed at [facility], [place] [address], [place], [place] [address]   Urinalysis, Routine w reflex microscopic     Status: Abnormal   Collection Time: 08/10/19  8:15 PM  Result Value Ref Range   Color, Urine STRAW (A) YELLOW   APPearance CLEAR CLEAR   Specific Gravity, Urine 1.004 (L) 1.005 - 1.030   pH 6.0 5.0 - 8.0   Glucose, UA NEGATIVE NEGATIVE mg/dL   Hgb urine dipstick NEGATIVE NEGATIVE   Bilirubin Urine NEGATIVE NEGATIVE   Ketones, ur NEGATIVE NEGATIVE mg/dL   Protein, ur NEGATIVE NEGATIVE mg/dL   Nitrite NEGATIVE NEGATIVE   Leukocytes,Ua NEGATIVE NEGATIVE    Comment: Performed at [facility] [address], [place], [place] [address]  Lactic acid, plasma     Status: None   Collection Time: 08/10/19 11:31 PM  Result Value Ref Range   Lactic Acid, Venous 0.9 0.5 - 1.9 mmol/L    Comment: Performed at [facility] [address], [place], [place] [address]  Culture, blood (Routine x 2)     Status: None (Preliminary result)   Collection  Time: 08/10/19 11:31 PM   Specimen: BLOOD  Result Value Ref Range   Specimen Description BLOOD RIGHT ARM    Special Requests      BOTTLES DRAWN AEROBIC AND ANAEROBIC Blood Culture results may not be optimal due to an excessive volume of blood received in culture bottles   Culture      NO GROWTH < 12 HOURS Performed at [facility] [address], [place],  [address]    Report Status PENDING   SARS Coronavirus 2 by RT PCR (hospital order, performed in [facility] hospital lab) Nasopharyngeal Nasopharyngeal Swab     Status: None   Collection Time: 08/11/19  1:31 AM   Specimen: Nasopharyngeal Swab  Result Value Ref Range   SARS Coronavirus 2 NEGATIVE NEGATIVE    Comment: (NOTE) SARS-CoV-2 target nucleic acids are NOT DETECTED. The SARS-CoV-2 RNA is generally detectable in upper and lower respiratory specimens during the acute phase of infection. The lowest concentration of SARS-CoV-2 viral copies this assay can detect is 250 copies / mL. A negative result does not preclude SARS-CoV-2 infection and should not be used as the sole basis for treatment or other patient management decisions.  A negative result may occur with improper specimen collection / handling,  submission of specimen other than nasopharyngeal swab, presence of viral mutation(s) within the areas targeted by this assay, and inadequate number of viral copies (<250 copies / mL). A negative result must be combined with clinical observations, patient history, and epidemiological information. Fact Sheet for Patients:   BoilerBrush.com.cy Fact Sheet for Healthcare Providers: https://pope.com/ This test is not yet approved or cleared  by the [place] FDA and has been authorized for detection and/or diagnosis of SARS-CoV-2 by FDA under an Emergency Use Authorization (EUA).  This EUA will remain in effect (meaning this test can be used) for the duration of  the COVID-19 declaration under Section 564(b)(1) of the Act, 21 U.S.C. section 360bbb-3(b)(1), unless the authorization is terminated or revoked sooner. Performed at [facility], [address] [place], [place] [address]   CBG monitoring, ED     Status: None   Collection Time: 08/11/19  1:37 AM  Result Value Ref Range   Glucose-Capillary 85 70 - 99 mg/dL    Comment: Glucose reference range applies only to samples taken after fasting for at least 8 hours.  Surgical pcr screen     Status: None   Collection Time: 08/11/19  2:24 AM   Specimen: Nasal Mucosa; Nasal Swab  Result Value Ref Range   MRSA, PCR NEGATIVE NEGATIVE   Staphylococcus aureus NEGATIVE NEGATIVE    Comment: (NOTE) The Xpert SA Assay (FDA approved for NASAL specimens in patients 60 years of age and older), is one component of a comprehensive surveillance program. It is not intended to diagnose infection nor to guide or monitor treatment. Performed at [facility], [address], [place], [place] [address]   CBC     Status: None   Collection Time: 08/11/19  2:26 AM  Result Value Ref Range   WBC 9.1 4.0 - 10.5 K/uL   RBC 4.51 4.22 - 5.81 MIL/uL   Hemoglobin 13.1 13.0 - 17.0 g/dL   HCT 89.2 11.9 - 41.7 %   MCV 89.4 80.0 - 100.0 fL   MCH 29.0 26.0 - 34.0 pg   MCHC 32.5 30.0 - 36.0 g/dL   RDW 40.8 14.4 - 81.8 %   Platelets 243 150 - 400 K/uL   nRBC 0.0 0.0 - 0.2 %    Comment: Performed at [facility], [address], [place], [place] [address]  Glucose, capillary     Status: None   Collection Time: 08/11/19  5:45 AM  Result Value Ref Range   Glucose-Capillary 87 70 - 99 mg/dL    Comment: Glucose reference range applies only to samples taken after fasting for at least 8 hours.    DG Hand Complete Left  Result Date: 08/10/2019 CLINICAL DATA:  Long finger wound. EXAM: LEFT HAND - COMPLETE 3+ VIEW COMPARISON:  CT left hand dated Jul 15, 2019. Left hand x-rays dated June 21, 2019. FINDINGS: New  osseous destruction of the third middle phalanx head and base of the third distal phalanx with surrounding soft tissue swelling. Widenign of the third DIP joint space narrowing on the lateral view. Remaining joint spaces are preserved. IMPRESSION: 1. New third DIP joint septic arthritis and osteomyelitis involving the peri-articular third middle and distal phalanges. These results were called by telephone at the time of interpretation on 08/10/2019 at 3:00 pm to provider [facility], who verbally acknowledged these results. Electronically Signed   By: Obie Dredge M.D.   On: 08/10/2019 15:36     A comprehensive review  of systems was negative. Review of Systems: No fevers, chills, night sweats, chest pain, shortness of breath, nausea, vomiting, diarrhea, constipation, easy bleeding or bruising, headaches, dizziness, vision changes, fainting.   Blood pressure (!) 115/96, pulse 60, temperature 98.5 F (36.9 C), resp. rate 16, height 5\' 9"  (1.753 m), weight 85.3 kg, SpO2 97 %.  General appearance: alert, cooperative and appears stated age Head: Normocephalic, without obvious abnormality, atraumatic Neck: supple, symmetrical, trachea midline Extremities: Intact sensation and capillary refill all digits.  +epl/fpl/io.  Left long finger with open wounds dorsally at dip joint.  Erythema surrounding.  Drainage from wounds.  Unstable finger distally.  Non tender over flexor tendon proximal and middle phalanges.  No proxima streaking. Pulses: 2+ and symmetric Skin: Skin color, texture, turgor normal. No rashes or lesions Neurologic: Grossly normal Incision/Wound: as above  Assessment/Plan Left long finger infection with osteomyelitis of middle and distal phalanges.  Discussed treatment options including efforts at digit salvage with I&D and likely eventual efforts at fusion of dip joint vs amputation of infected portion of finger and bone.  He wishes to proceed with amputation.  Risks, benefits and  alternatives of surgery were discussed including risks of blood loss, infection, damage to nerves/vessels/tendons/ligament/bone, failure of surgery, need for additional surgery, complication with wound healing, stiffness, need for repeat irrigation and debridement, amputation.  He voiced understanding of these risks and elected to proceed.    08/11/2019, 7:30 AM

## 2019-08-11 NOTE — Progress Notes (Signed)
Pt has q2hr CBGs with no clear indication. Ac/hs protocol will be implemented as pt is on a carb modified diet.

## 2019-08-11 NOTE — Anesthesia Procedure Notes (Signed)
Procedure Name: LMA Insertion Date/Time: 08/11/2019 8:00 AM Performed by: Wilder Glade, CRNA Pre-anesthesia Checklist: Patient identified, Emergency Drugs available, Suction available, Patient being monitored and Timeout performed Patient Re-evaluated:Patient Re-evaluated prior to induction Oxygen Delivery Method: Circle system utilized Preoxygenation: Pre-oxygenation with 100% oxygen Induction Type: IV induction Ventilation: Mask ventilation without difficulty LMA: LMA inserted LMA Size: 4.0 Number of attempts: 1 Placement Confirmation: positive ETCO2 and breath sounds checked- equal and bilateral ETT to lip (cm): yes. Tube secured with: Tape Dental Injury: Teeth and Oropharynx as per pre-operative assessment

## 2019-08-11 NOTE — Discharge Instructions (Signed)

## 2019-08-11 NOTE — Progress Notes (Signed)
Status post amputation left long finger for infection and osteomyelitis.  Tolerated procedure well.  Good soft tissue coverage obtained with drain in place.  Okay for discharge later today with follow-up next week in office.  Prescription for hydrocodone 5/325 1-2 p.o. every 6 hours brain pain dispense #20 and doxycycline 100 mg p.o. twice daily x14 days sent to pharmacy on file.

## 2019-08-11 NOTE — Progress Notes (Signed)
Pharmacy Antibiotic Note  MARLOW BERENGUER is a 49 y.o. male admitted on 08/10/2019 with osteo of finger - planned surgery in am  Good renal fx  Height: 5\' 9"  (175.3 cm) Weight: 85.3 kg (188 lb) IBW/kg (Calculated) : 70.7  Temp (24hrs), Avg:98.7 F (37.1 C), Min:98.5 F (36.9 C), Max:98.8 F (37.1 C)  Recent Labs  Lab 08/10/19 1716 08/10/19 2331  WBC 13.3*  --   CREATININE 0.92  --   LATICACIDVEN 1.1 0.9    Estimated Creatinine Clearance: 103.9 mL/min (by C-G formula based on SCr of 0.92 mg/dL).    Allergies  Allergen Reactions  . Gabapentin Other (See Comments)    Drowsiness   . Sulfonamide Derivatives     REACTION: pt. can't remember reaction   Plan: Vanc 1 g q8h Zosyn 3.375 gm iv q8 Monitor renal fx cx lvls prn F/U abx dc post-op?  10/10/19, PharmD, BCPS, BCCCP Clinical Pharmacist [phone]  Please check AMION for all [facility] Pharmacy numbers  08/11/2019 1:48 AM

## 2019-08-11 NOTE — Progress Notes (Signed)
Agree with plan as stated by the admitting physician. Hand surgery was consulted he is status post left finger amputation throughout the middle phalanx. Started empirically on IV vancomycin and Zosyn blood cultures are pending.  Can have a diet per surgery.  Once he is given a diet restart on long-acting insulin plus sliding scale.

## 2019-08-11 NOTE — Anesthesia Preprocedure Evaluation (Addendum)
Anesthesia Evaluation  Patient identified by MRN, date of birth, ID band Patient awake    Reviewed: Allergy & Precautions, NPO status , Patient's Chart, lab work & pertinent test results  Airway Mallampati: II  TM Distance: >3 FB Neck ROM: Full    Dental  (+) Missing   Pulmonary Current Smoker and Patient abstained from smoking.,    Pulmonary exam normal breath sounds clear to auscultation       Cardiovascular hypertension, Pt. on medications Normal cardiovascular exam Rhythm:Regular Rate:Normal     Neuro/Psych PSYCHIATRIC DISORDERS Anxiety Depression negative neurological ROS     GI/Hepatic negative GI ROS, Neg liver ROS,   Endo/Other  diabetes, Insulin Dependent  Renal/GU negative Renal ROS     Musculoskeletal  (+) Arthritis , osteomyelitis    Abdominal   Peds  Hematology HLD   Anesthesia Other Findings LEFT LONG FINGER INFECTION  Reproductive/Obstetrics                           Anesthesia Physical Anesthesia Plan  ASA: III  Anesthesia Plan: General   Post-op Pain Management:    Induction: Intravenous  PONV Risk Score and Plan: 1 and Ondansetron, Dexamethasone and Midazolam  Airway Management Planned: LMA  Additional Equipment:   Intra-op Plan:   Post-operative Plan: Extubation in OR  Informed Consent: I have reviewed the patients History and Physical, chart, labs and discussed the procedure including the risks, benefits and alternatives for the proposed anesthesia with the patient or authorized representative who has indicated his/her understanding and acceptance.     Dental advisory given  Plan Discussed with: CRNA  Anesthesia Plan Comments:       Anesthesia Quick Evaluation

## 2019-08-11 NOTE — H&P (Signed)
History and Physical    Larry Lawson MVE:[id number] DOB: [birth year] DOA: 08/10/2019  PCP: Hali Marry, MD Patient coming from: Home  Chief Complaint: Finger infection  HPI: Larry Lawson is a 49 y.o. male with medical history significant of insulin-dependent type 2 diabetes, hypertension presenting to the ED for evaluation of osteomyelitis of the left third digit.  Patient states he picked up blood blister on his finger about 4 weeks ago and the infection started.  It has continued to worsen since then and the finger is painful.  Recently he bumped this finger into an object at home.  Denies fevers or chills.  Denies cough, shortness of breath, chest pain, nausea, vomiting, abdominal pain, diarrhea.  Patient was seen by his PCP today and x-ray revealed new third DIP joint septic arthritis and osteomyelitis involving the periarticular third middle and distal phalanges.  He was sent to the ED for further management.  ED Course: Afebrile and hemodynamically stable.  WBC count 13.3.  Lactic acid normal x2.  Blood glucose 66.  Blood culture x2 pending.  Screening SARS-CoV-2 PCR test pending.  ED provider discussed the case with Dr. Fredna Dow from hand surgery who is planning on taking the patient to the OR in the morning.  Patient was given vancomycin and Zosyn.  Review of Systems:  All systems reviewed and apart from history of presenting illness, are negative.  Past Medical History:  Diagnosis Date  . Diabetes mellitus   . Heart murmur   . Heart murmur   . Hypertension     History reviewed. No pertinent surgical history.   reports that he has been smoking. He has been smoking about 0.50 packs per day. He uses smokeless tobacco. He reports current alcohol use. He reports that he does not use drugs.  Allergies  Allergen Reactions  . Gabapentin Other (See Comments)    Drowsiness   . Sulfonamide Derivatives     REACTION: pt. can't remember reaction    Family History    Problem Relation Age of Onset  . Hyperlipidemia Mother   . Heart failure Mother   . Hypertension Father   . Prostate cancer Father   . Cancer Other        grandmother  . Diabetes Brother   . Heart attack Other        grandfather  . Heart attack Brother 25  . Heart failure Brother   . Heart disease Paternal Uncle     Prior to Admission medications   Medication Sig Start Date End Date Taking? Authorizing Provider  albuterol (VENTOLIN HFA) 108 (90 Base) MCG/ACT inhaler INHALE 2 PUFFS INTO THE LUNGS EVERY 6 HOURS AS NEEDED FOR WHEEZING. Patient taking differently: Inhale 2 puffs into the lungs every 6 (six) hours as needed for wheezing or shortness of breath. INHALE 2 PUFFS INTO THE LUNGS EVERY 6 HOURS AS NEEDED FOR WHEEZING. 03/07/19   Hali Marry, MD  AMBULATORY NON FORMULARY MEDICATION Medication Name: Glucometer with strips and lancets to check daily.  Dx: Diabetes 250.00 06/21/14   Breeback, Jade L, PA-C  amLODipine (NORVASC) 10 MG tablet Take 1 tablet (10 mg total) by mouth daily. 07/19/19   Hali Marry, MD  amoxicillin-clavulanate (AUGMENTIN) 875-125 MG tablet Take 1 tablet by mouth 2 (two) times daily. 07/19/19   Hali Marry, MD  atorvastatin (LIPITOR) 40 MG tablet TAKE 1 TABLET BY MOUTH EVERY DAY Patient taking differently: Take 40 mg by mouth daily at 6 PM.  12/27/18  Agapito Games, MD  clonazePAM (KLONOPIN) 0.5 MG tablet TAKE 1 TABLET (0.5 MG TOTAL) BY MOUTH DAILY AS NEEDED FOR ANXIETY. THIS IS A 30 DAY SUPPLY 04/23/19   Agapito Games, MD  FLUoxetine (PROZAC) 40 MG capsule TAKE 1 CAPSULE BY MOUTH EVERY DAY Patient taking differently: Take 40 mg by mouth daily.  01/04/19   Agapito Games, MD  ibuprofen (ADVIL) 600 MG tablet Take 1 tablet (600 mg total) by mouth every 8 (eight) hours as needed. 07/16/19   Agapito Games, MD  Insulin Pen Needle (B-D ULTRAFINE III SHORT PEN) 31G X 8 MM MISC USE  TO ADMINISTER INSULIN, once  daily. Dx E11.8 07/23/19   Agapito Games, MD  JANUMET XR 50-1000 MG TB24 TAKE 1 TABLET BY MOUTH EVERY DAY 02/27/19   Agapito Games, MD  losartan-hydrochlorothiazide [facility]) 100-25 MG tablet TAKE 1 TABLET BY MOUTH EVERY DAY 07/30/19   Agapito Games, MD  sildenafil (REVATIO) 20 MG tablet TAKE 2 TO 5 TABLETS BY MOUTH ONCE DAILY AS NEEDED 04/24/19   Agapito Games, MD  TOUJEO SOLOSTAR 300 UNIT/ML Solostar Pen INJECT 36 UNITS INTO THE SKIN AT BEDTIME. 06/11/19   Agapito Games, MD  zolpidem (AMBIEN) 10 MG tablet TAKE 1 TABLET BY MOUTH AT BEDTIME AS NEEDED FOR SLEEP. 05/03/19   Agapito Games, MD    Physical Exam: Vitals:   08/11/19 0100 08/11/19 0115 08/11/19 0130 08/11/19 0202  BP: 132/71 119/86 136/76 (!) 141/83  Pulse: 61 (!) 58 62 63  Resp: 13 15 16 15   Temp:    98.5 F (36.9 C)  TempSrc:      SpO2: 98% 97% 96% 99%  Weight:      Height:        Physical Exam  Constitutional: He is oriented to person, place, and time. He appears well-developed and well-nourished. No distress.  HENT:  Head: Normocephalic.  Eyes: Right eye exhibits no discharge. Left eye exhibits no discharge.  Cardiovascular: Normal rate, regular rhythm and intact distal pulses.  Pulmonary/Chest: Effort normal and breath sounds normal. No respiratory distress. He has no wheezes. He has no rales.  Abdominal: Soft. Bowel sounds are normal. He exhibits no distension. There is no abdominal tenderness. There is no guarding.  Musculoskeletal:     Cervical back: Neck supple.     Comments: Left hand third digit swollen with erythema and purulent drainage  Neurological: He is alert and oriented to person, place, and time.  Skin: Skin is warm and dry. He is not diaphoretic.       Labs on Admission: I have personally reviewed following labs and imaging studies  CBC: Recent Labs  Lab 08/10/19 1716  WBC 13.3*  NEUTROABS 8.8*  HGB 14.0  HCT 42.7  MCV 89.3  PLT 295   Basic  Metabolic Panel: Recent Labs  Lab 08/10/19 1716  NA 137  K 3.7  CL 100  CO2 27  GLUCOSE 66*  BUN 13  CREATININE 0.92  CALCIUM 9.2   GFR: Estimated Creatinine Clearance: 103.9 mL/min (by C-G formula based on SCr of 0.92 mg/dL). Liver Function Tests: Recent Labs  Lab 08/10/19 1716  AST 33  ALT 34  ALKPHOS 80  BILITOT 0.5  PROT 7.1  ALBUMIN 4.0   No results for input(s): LIPASE, AMYLASE in the last 168 hours. No results for input(s): AMMONIA in the last 168 hours. Coagulation Profile: Recent Labs  Lab 08/10/19 1716  INR 1.0   Cardiac  Enzymes: No results for input(s): CKTOTAL, CKMB, CKMBINDEX, TROPONINI in the last 168 hours. BNP (last 3 results) No results for input(s): PROBNP in the last 8760 hours. HbA1C: No results for input(s): HGBA1C in the last 72 hours. CBG: Recent Labs  Lab 08/11/19 0137  GLUCAP 85   Lipid Profile: No results for input(s): CHOL, HDL, LDLCALC, TRIG, CHOLHDL, LDLDIRECT in the last 72 hours. Thyroid Function Tests: No results for input(s): TSH, T4TOTAL, FREET4, T3FREE, THYROIDAB in the last 72 hours. Anemia Panel: No results for input(s): VITAMINB12, FOLATE, FERRITIN, TIBC, IRON, RETICCTPCT in the last 72 hours. Urine analysis:    Component Value Date/Time   COLORURINE STRAW (A) 08/10/2019 2015   APPEARANCEUR CLEAR 08/10/2019 2015   LABSPEC 1.004 (L) 08/10/2019 2015   PHURINE 6.0 08/10/2019 2015   GLUCOSEU NEGATIVE 08/10/2019 2015   HGBUR NEGATIVE 08/10/2019 2015   BILIRUBINUR NEGATIVE 08/10/2019 2015   BILIRUBINUR neg 12/26/2015 1537   KETONESUR NEGATIVE 08/10/2019 2015   PROTEINUR NEGATIVE 08/10/2019 2015   UROBILINOGEN 0.2 12/26/2015 1537   NITRITE NEGATIVE 08/10/2019 2015   LEUKOCYTESUR NEGATIVE 08/10/2019 2015    Radiological Exams on Admission: DG Hand Complete Left  Result Date: 08/10/2019 CLINICAL DATA:  Long finger wound. EXAM: LEFT HAND - COMPLETE 3+ VIEW COMPARISON:  CT left hand dated Jul 15, 2019. Left hand x-rays  dated June 21, 2019. FINDINGS: New osseous destruction of the third middle phalanx head and base of the third distal phalanx with surrounding soft tissue swelling. Widenign of the third DIP joint space narrowing on the lateral view. Remaining joint spaces are preserved. IMPRESSION: 1. New third DIP joint septic arthritis and osteomyelitis involving the peri-articular third middle and distal phalanges. These results were called by telephone at the time of interpretation on 08/10/2019 at 3:00 pm to provider [facility], who verbally acknowledged these results. Electronically Signed   By: Obie Dredge M.D.   On: 08/10/2019 15:36    Assessment/Plan Principal Problem:   Osteomyelitis (HCC) Active Problems:   Tobacco abuse   Septic arthritis (HCC)   Hypoglycemia due to insulin   Type 2 diabetes mellitus (HCC)   Septic arthritis and osteomyelitis of the left third digit: Labs showing mild leukocytosis.  Patient is afebrile.  Lactic acid check twice normal.  No signs of sepsis.  Hand surgery is planning on taking patient to the OR in the morning. -Continue vancomycin and Zosyn.  Morphine as needed for pain.  Blood culture x2 pending.  Continue to monitor WBC count.  Keep n.p.o.  Hypoglycemia in the setting of insulin-dependent diabetes mellitus: Takes long-acting insulin (Toujeo) at home.  Per patient, his last dose of insulin was 6/3 evening.  However, hypoglycemic on arrival with blood glucose 66, repeat 85.  Patient states he did not eat dinner today. -Start D5-NS infusion as patient is currently n.p.o. Hold home Toujeo and Janumet.  Monitor CBG every 2 hours and adjust rate of dextrose infusion accordingly.  Tobacco use: Smokes 1/2 pack of cigarettes daily. -NicoDerm patch and counseling  HIV screening: The patient falls between the ages of 13-64 and should be screened for HIV, therefore HIV testing ordered.  DVT prophylaxis: SCDs Code Status: Full code Family Communication: No family  available at this time. Disposition Plan: Status is: Inpatient  Remains inpatient appropriate because:Ongoing active pain requiring inpatient pain management, IV treatments appropriate due to intensity of illness or inability to take PO, Inpatient level of care appropriate due to severity of illness and needs surgery for septic  arthritis and osteomyelitis of the left third digit.   Dispo: The patient is from: Home              Anticipated d/c is to: Home              Anticipated d/c date is: 3 days              Patient currently is not medically stable to d/c.  The medical decision making on this patient was of high complexity and the patient is at high risk for clinical deterioration, therefore this is a level 3 visit.  John Giovanni MD [facility]  If 7PM-7AM, please contact night-coverage [email or web address]  08/11/2019, 2:03 AM

## 2019-08-12 DIAGNOSIS — E160 Drug-induced hypoglycemia without coma: Secondary | ICD-10-CM | POA: Diagnosis not present

## 2019-08-12 DIAGNOSIS — M00042 Staphylococcal arthritis, left hand: Secondary | ICD-10-CM | POA: Diagnosis not present

## 2019-08-12 DIAGNOSIS — T383X5A Adverse effect of insulin and oral hypoglycemic [antidiabetic] drugs, initial encounter: Secondary | ICD-10-CM | POA: Diagnosis not present

## 2019-08-12 DIAGNOSIS — M869 Osteomyelitis, unspecified: Secondary | ICD-10-CM | POA: Diagnosis not present

## 2019-08-12 LAB — CBC
HCT: 36.9 % — ABNORMAL LOW (ref 39.0–52.0)
Hemoglobin: 11.9 g/dL — ABNORMAL LOW (ref 13.0–17.0)
MCH: 29.2 pg (ref 26.0–34.0)
MCHC: 32.2 g/dL (ref 30.0–36.0)
MCV: 90.4 fL (ref 80.0–100.0)
Platelets: 190 10*3/uL (ref 150–400)
RBC: 4.08 MIL/uL — ABNORMAL LOW (ref 4.22–5.81)
RDW: 14.5 % (ref 11.5–15.5)
WBC: 7 10*3/uL (ref 4.0–10.5)
nRBC: 0 % (ref 0.0–0.2)

## 2019-08-12 LAB — GLUCOSE, CAPILLARY
Glucose-Capillary: 147 mg/dL — ABNORMAL HIGH (ref 70–99)
Glucose-Capillary: 183 mg/dL — ABNORMAL HIGH (ref 70–99)

## 2019-08-12 MED ORDER — TRAMADOL HCL 50 MG PO TABS: 50.0000 mg | ORAL_TABLET | Freq: Four times a day (QID) | ORAL | 0 refills | Status: AC | PRN

## 2019-08-12 MED ORDER — DOXYCYCLINE HYCLATE 50 MG PO CAPS
100.0000 mg | ORAL_CAPSULE | Freq: Two times a day (BID) | ORAL | 0 refills | Status: AC
Start: 2019-08-12 — End: 2019-08-26

## 2019-08-12 NOTE — Discharge Summary (Addendum)
Physician Discharge Summary  Larry Lawson QBH:[id number] DOB: 29-Jan-1971 DOA: 08/10/2019  PCP: Hali Marry, MD  Admit date: 08/10/2019 Discharge date: 08/23/2019  Admitted From: Home Disposition:  Home  Recommendations for Outpatient Follow-up:  1. Follow up with Ortho in 1-2 weeks 2. Please obtain BMP/CBC in one week 3.   Home Health:No Equipment/Devices:None  Discharge Condition:Stable CODE STATUS:Full Diet recommendation: Heart Healthy    Brief/Interim Summary: 49 y.o. male with medical history significant of insulin-dependent type 2 diabetes, hypertension presenting to the ED for evaluation of osteomyelitis of the left third digit.  Patient states he picked up blood blister on his finger about 4 weeks ago and the infection started.  It has continued to worsen since then and the finger is painful.  Recently he bumped this finger into an object at home.    Discharge Diagnoses:  Principal Problem:   Osteomyelitis ([place]) Active Problems:   Tobacco abuse   Septic arthritis (HCC)   Hypoglycemia due to insulin   Type 2 diabetes mellitus (HCC)   Staph infection  Septic arthritis and osteomyelitis of the left third finger due to staph pyogenes: She was started empirically on IV vancomycin and Zosyn orthopedic surgery was consulted blood cultures were obtained which remain negative, he is status post amputation of the left long finger with osteomyelitis. He transition to oral doxycycline which should continue for 14 days follow-up with orthopedic surgery as an outpatient.  Hypoglycemia in the setting of insulin-dependent diabetes mellitus: Is blood glucose remain from 100 200 here in the hospital, he no changes made to his medication continue current regimen as an outpatient.  Tobacco abuse: Smokes half a pack of cigarettes per day.    Discharge Instructions  Discharge Instructions    Diet - low sodium heart healthy   Complete by: As directed    Increase activity  slowly   Complete by: As directed    No wound care   Complete by: As directed      Allergies as of 08/12/2019      Reactions   Gabapentin Other (See Comments)   Drowsiness   Sulfonamide Derivatives    REACTION: pt. can't remember reaction      Medication List    STOP taking these medications   amoxicillin-clavulanate 875-125 MG tablet Commonly known as: AUGMENTIN     TAKE these medications   albuterol 108 (90 Base) MCG/ACT inhaler Commonly known as: Ventolin HFA INHALE 2 PUFFS INTO THE LUNGS EVERY 6 HOURS AS NEEDED FOR WHEEZING. What changed:   how much to take  how to take this  when to take this  reasons to take this   [place] Medication Name: Glucometer with strips and lancets to check daily.  Dx: Diabetes 250.00   amLODipine 10 MG tablet Commonly known as: NORVASC Take 1 tablet (10 mg total) by mouth daily.   atorvastatin 40 MG tablet Commonly known as: LIPITOR TAKE 1 TABLET BY MOUTH EVERY DAY What changed: when to take this   B-D ULTRAFINE III SHORT PEN 31G X 8 MM Misc Generic drug: Insulin Pen Needle USE  TO ADMINISTER INSULIN, once daily. Dx E11.8   clonazePAM 0.5 MG tablet Commonly known as: KLONOPIN TAKE 1 TABLET (0.5 MG TOTAL) BY MOUTH DAILY AS NEEDED FOR ANXIETY. THIS IS A 30 DAY SUPPLY What changed: additional instructions   doxycycline 50 MG capsule Commonly known as: VIBRAMYCIN Take 2 capsules (100 mg total) by mouth 2 (two) times daily for 14 days.  FLUoxetine 40 MG capsule Commonly known as: PROZAC TAKE 1 CAPSULE BY MOUTH EVERY DAY What changed: how much to take   HYDROcodone-acetaminophen 5-325 MG tablet Commonly known as: Norco 1-2 tabs po q6 hours prn pain   ibuprofen 600 MG tablet Commonly known as: ADVIL Take 1 tablet (600 mg total) by mouth every 8 (eight) hours as needed. What changed: reasons to take this   Janumet XR 50-1000 MG Tb24 Generic drug: SitaGLIPtin-MetFORMIN HCl TAKE 1 TABLET BY  MOUTH EVERY DAY   losartan-hydrochlorothiazide 100-25 MG tablet Commonly known as: HYZAAR TAKE 1 TABLET BY MOUTH EVERY DAY   sildenafil 20 MG tablet Commonly known as: REVATIO TAKE 2 TO 5 TABLETS BY MOUTH ONCE DAILY AS NEEDED What changed:   how much to take  how to take this  when to take this  reasons to take this  additional instructions   Toujeo SoloStar 300 UNIT/ML Solostar Pen Generic drug: insulin glargine (1 Unit Dial) INJECT 36 UNITS INTO THE SKIN AT BEDTIME. What changed: See the new instructions.   zolpidem 10 MG tablet Commonly known as: AMBIEN TAKE 1 TABLET BY MOUTH AT BEDTIME AS NEEDED FOR SLEEP. What changed:   reasons to take this  additional instructions     ASK your doctor about these medications   traMADol 50 MG tablet Commonly known as: Ultram Take 1 tablet (50 mg total) by mouth every 6 (six) hours as needed for up to 5 days. Ask about: Should I take this medication?       Follow-up Information    Betha Loa, MD On 08/15/2019.   Specialty: Orthopedic Surgery Contact information: [address] [place] [address] [phone]              Allergies  Allergen Reactions  . Gabapentin Other (See Comments)    Drowsiness   . Sulfonamide Derivatives     REACTION: pt. can't remember reaction    Consultations:  Orthopedic surgery   Procedures/Studies: DG Hand Complete Left  Result Date: 08/10/2019 CLINICAL DATA:  Long finger wound. EXAM: LEFT HAND - COMPLETE 3+ VIEW COMPARISON:  CT left hand dated Jul 15, 2019. Left hand x-rays dated June 21, 2019. FINDINGS: New osseous destruction of the third middle phalanx head and base of the third distal phalanx with surrounding soft tissue swelling. Widenign of the third DIP joint space narrowing on the lateral view. Remaining joint spaces are preserved. IMPRESSION: 1. New third DIP joint septic arthritis and osteomyelitis involving the peri-articular third middle and distal phalanges.  These results were called by telephone at the time of interpretation on 08/10/2019 at 3:00 pm to provider [facility], who verbally acknowledged these results. Electronically Signed   By: Obie Dredge M.D.   On: 08/10/2019 15:36     Subjective: No complaints feels well.  Discharge Exam: Vitals:   08/12/19 0347 08/12/19 1038  BP: 137/74 (!) 153/70  Pulse: 64 63  Resp: 18 18  Temp: 98.3 F (36.8 C) 98.8 F (37.1 C)  SpO2: 97% 99%   Vitals:   08/11/19 1740 08/11/19 1958 08/12/19 0347 08/12/19 1038  BP: (!) 150/71 131/76 137/74 (!) 153/70  Pulse: (!) 59 (!) 57 64 63  Resp: 18 18 18 18   Temp: 98.9 F (37.2 C) 98.1 F (36.7 C) 98.3 F (36.8 C) 98.8 F (37.1 C)  TempSrc: Oral Oral Oral Oral  SpO2: 99% 99% 97% 99%  Weight:      Height:        General:  Pt is alert, awake, not in acute distress Cardiovascular: RRR, S1/S2 +, no rubs, no gallops Respiratory: CTA bilaterally, no wheezing, no rhonchi Abdominal: Soft, NT, ND, bowel sounds + Extremities: no edema, no cyanosis    The results of significant diagnostics from this hospitalization (including imaging, microbiology, ancillary and laboratory) are listed below for reference.     Microbiology: No results found for this or any previous visit (from the past 240 hour(s)).   Labs: BNP (last 3 results) No results for input(s): BNP in the last 8760 hours. Basic Metabolic Panel: No results for input(s): NA, K, CL, CO2, GLUCOSE, BUN, CREATININE, CALCIUM, MG, PHOS in the last 168 hours. Liver Function Tests: No results for input(s): AST, ALT, ALKPHOS, BILITOT, PROT, ALBUMIN in the last 168 hours. No results for input(s): LIPASE, AMYLASE in the last 168 hours. No results for input(s): AMMONIA in the last 168 hours. CBC: No results for input(s): WBC, NEUTROABS, HGB, HCT, MCV, PLT in the last 168 hours. Cardiac Enzymes: No results for input(s): CKTOTAL, CKMB, CKMBINDEX, TROPONINI in the last 168 hours. BNP: Invalid  input(s): POCBNP CBG: No results for input(s): GLUCAP in the last 168 hours. D-Dimer No results for input(s): DDIMER in the last 72 hours. Hgb A1c No results for input(s): HGBA1C in the last 72 hours. Lipid Profile No results for input(s): CHOL, HDL, LDLCALC, TRIG, CHOLHDL, LDLDIRECT in the last 72 hours. Thyroid function studies No results for input(s): TSH, T4TOTAL, T3FREE, THYROIDAB in the last 72 hours.  Invalid input(s): FREET3 Anemia work up No results for input(s): VITAMINB12, FOLATE, FERRITIN, TIBC, IRON, RETICCTPCT in the last 72 hours. Urinalysis    Component Value Date/Time   COLORURINE STRAW (A) 08/10/2019 2015   APPEARANCEUR CLEAR 08/10/2019 2015   LABSPEC 1.004 (L) 08/10/2019 2015   PHURINE 6.0 08/10/2019 2015   GLUCOSEU NEGATIVE 08/10/2019 2015   HGBUR NEGATIVE 08/10/2019 2015   BILIRUBINUR NEGATIVE 08/10/2019 2015   BILIRUBINUR neg 12/26/2015 1537   KETONESUR NEGATIVE 08/10/2019 2015   PROTEINUR NEGATIVE 08/10/2019 2015   UROBILINOGEN 0.2 12/26/2015 1537   NITRITE NEGATIVE 08/10/2019 2015   LEUKOCYTESUR NEGATIVE 08/10/2019 2015   Sepsis Labs Invalid input(s): PROCALCITONIN,  WBC,  LACTICIDVEN Microbiology No results found for this or any previous visit (from the past 240 hour(s)).   Time coordinating discharge: Over 40 minutes  SIGNED:   Marinda Elk, MD  [facility] 08/23/2019, 11:50 AM Pager   If 7PM-7AM, please contact night-coverage [email or web address] Password [email or web address]

## 2019-08-12 NOTE — Anesthesia Postprocedure Evaluation (Signed)
Anesthesia Post Note  Patient: Larry Lawson  Procedure(s) Performed: IRRIGATION AND DEBRIDEMENT LONG FINGER, AMPUTATION (Left Hand)     Patient location during evaluation: PACU Anesthesia Type: General Level of consciousness: awake and alert Pain management: pain level controlled Vital Signs Assessment: post-procedure vital signs reviewed and stable Respiratory status: spontaneous breathing, nonlabored ventilation, respiratory function stable and patient connected to nasal cannula oxygen Cardiovascular status: blood pressure returned to baseline and stable Postop Assessment: no apparent nausea or vomiting Anesthetic complications: no    Last Vitals:  Vitals:   08/11/19 1740 08/11/19 1958  BP: (!) 150/71 131/76  Pulse: (!) 59 (!) 57  Resp: 18 18  Temp: 37.2 C 36.7 C  SpO2: 99% 99%    Last Pain:  Vitals:   08/11/19 2007  TempSrc:   PainSc: 8                  Emmanuella Mirante P Harsh Trulock

## 2019-08-12 NOTE — Progress Notes (Signed)
Pt DC scheduled for today. Pt provided with e-presciptions, appointment reminders and educational information. Pt verbalized understanding of discharge instructions. Pt tolerated teaching session well. Pt ambulated off unit independently without incident.

## 2019-08-14 LAB — SURGICAL PATHOLOGY

## 2019-08-15 DIAGNOSIS — M869 Osteomyelitis, unspecified: Secondary | ICD-10-CM | POA: Diagnosis not present

## 2019-08-15 DIAGNOSIS — L02512 Cutaneous abscess of left hand: Secondary | ICD-10-CM | POA: Insufficient documentation

## 2019-08-15 LAB — CULTURE, BLOOD (ROUTINE X 2)
Culture: NO GROWTH
Culture: NO GROWTH
Special Requests: ADEQUATE

## 2019-08-16 LAB — AEROBIC/ANAEROBIC CULTURE W GRAM STAIN (SURGICAL/DEEP WOUND): Gram Stain: NONE SEEN

## 2019-08-23 DIAGNOSIS — B958 Unspecified staphylococcus as the cause of diseases classified elsewhere: Secondary | ICD-10-CM

## 2019-08-29 ENCOUNTER — Other Ambulatory Visit: Payer: Self-pay | Admitting: Family Medicine

## 2019-08-29 DIAGNOSIS — M869 Osteomyelitis, unspecified: Secondary | ICD-10-CM | POA: Diagnosis not present

## 2019-08-29 DIAGNOSIS — Z89021 Acquired absence of right finger(s): Secondary | ICD-10-CM | POA: Diagnosis not present

## 2019-09-26 ENCOUNTER — Other Ambulatory Visit: Payer: Self-pay | Admitting: Family Medicine

## 2019-10-10 ENCOUNTER — Other Ambulatory Visit: Payer: Self-pay | Admitting: Family Medicine

## 2019-10-22 ENCOUNTER — Other Ambulatory Visit: Payer: Self-pay | Admitting: Family Medicine

## 2019-10-23 ENCOUNTER — Ambulatory Visit: Payer: BC Managed Care – PPO | Admitting: Family Medicine

## 2019-10-23 ENCOUNTER — Telehealth: Payer: Self-pay | Admitting: Family Medicine

## 2019-10-23 NOTE — Telephone Encounter (Signed)
OK 

## 2019-10-23 NOTE — Telephone Encounter (Signed)
Patient will not make it, states he would be later than the 9 min rule. Patient has rescheduled.

## 2019-10-27 ENCOUNTER — Other Ambulatory Visit: Payer: Self-pay | Admitting: Family Medicine

## 2019-10-30 ENCOUNTER — Ambulatory Visit: Payer: BLUE CROSS/BLUE SHIELD | Admitting: Family Medicine

## 2019-11-01 ENCOUNTER — Other Ambulatory Visit: Payer: Self-pay | Admitting: Family Medicine

## 2019-11-04 ENCOUNTER — Other Ambulatory Visit: Payer: Self-pay | Admitting: Family Medicine

## 2019-11-06 ENCOUNTER — Other Ambulatory Visit: Payer: Self-pay | Admitting: Family Medicine

## 2019-11-12 ENCOUNTER — Other Ambulatory Visit: Payer: Self-pay | Admitting: Family Medicine

## 2019-11-14 MED ORDER — SILDENAFIL CITRATE 20 MG PO TABS: ORAL_TABLET | ORAL | 0 refills | Status: DC

## 2019-11-16 ENCOUNTER — Other Ambulatory Visit: Payer: Self-pay | Admitting: Family Medicine

## 2019-11-29 ENCOUNTER — Other Ambulatory Visit: Payer: Self-pay | Admitting: Family Medicine

## 2019-12-03 ENCOUNTER — Other Ambulatory Visit: Payer: Self-pay | Admitting: Family Medicine

## 2019-12-03 ENCOUNTER — Encounter: Payer: Self-pay | Admitting: Family Medicine

## 2019-12-03 ENCOUNTER — Ambulatory Visit (INDEPENDENT_AMBULATORY_CARE_PROVIDER_SITE_OTHER): Payer: BLUE CROSS/BLUE SHIELD | Admitting: Family Medicine

## 2019-12-03 ENCOUNTER — Other Ambulatory Visit: Payer: Self-pay

## 2019-12-03 VITALS — BP 141/78 | HR 91 | Ht 69.0 in | Wt 191.0 lb

## 2019-12-03 DIAGNOSIS — I10 Essential (primary) hypertension: Secondary | ICD-10-CM

## 2019-12-03 DIAGNOSIS — E118 Type 2 diabetes mellitus with unspecified complications: Secondary | ICD-10-CM | POA: Diagnosis not present

## 2019-12-03 DIAGNOSIS — M869 Osteomyelitis, unspecified: Secondary | ICD-10-CM

## 2019-12-03 DIAGNOSIS — Z23 Encounter for immunization: Secondary | ICD-10-CM | POA: Diagnosis not present

## 2019-12-03 LAB — POCT GLYCOSYLATED HEMOGLOBIN (HGB A1C): Hemoglobin A1C: 8.5 % — AB (ref 4.0–5.6)

## 2019-12-03 NOTE — Assessment & Plan Note (Signed)
A1c is elevated today at 8.5 it is uncontrolled.  This is up from 7.4 back in May.  Just discussed and encouraged him to get back on track with his medications as well as dietary choices will follow back up in 3 months.  He says he does not need refills today but he will need a new prescription for updated pen needles.

## 2019-12-03 NOTE — Progress Notes (Signed)
Established Patient Office Visit  Subjective:  Patient ID: Larry Lawson, male    DOB: 10/28/1970  Age: 49 y.o. MRN: [id number]  CC:  Chief Complaint  Patient presents with   Diabetes   Hypertension    HPI Larry SafeJoseph M Beedle presents for  Diabetes - no hypoglycemic events. No wounds or sores that are not healing well. No increased thirst or urination. Checking glucose at home. Taking medications as prescribed without any side effects.  Menses not been very consistent with his Janumet or Toujeo but says he has plenty of prescription medication he does have a hard time getting back on a routine.  He did let me know he stopped his amlodipine back in July he had had an episode where he had been working on the heat and suddenly felt very dizzy and lightheaded.  His sister who is a nurse had recommended that he stop his amlodipine so he has been off of it since then.  He said his been checking his blood pressures at home and they typically run around 130/85.  Is actually had an amputation of his left middle finger since I last saw him after he developed osteomyelitis.  He is doing well and is back to work.  Past Medical History:  Diagnosis Date   Diabetes mellitus    Heart murmur    Heart murmur    Hypertension     Past Surgical History:  Procedure Laterality Date   I & D EXTREMITY Left 08/11/2019   Procedure: IRRIGATION AND DEBRIDEMENT LONG FINGER, AMPUTATION;  Surgeon: Betha LoaKuzma, Kevin, MD;  Location: [facility] OR;  Service: Orthopedics;  Laterality: Left;    Family History  Problem Relation Age of Onset   Hyperlipidemia Mother    Heart failure Mother    Hypertension Father    Prostate cancer Father    Cancer Other        grandmother   Diabetes Brother    Heart attack Other        grandfather   Heart attack Brother [id number]   Heart failure Brother    Heart disease Paternal Uncle     Social History   Socioeconomic History   Marital status: Married    Spouse name: Not on  file   Number of children: Not on file   Years of education: Not on file   Highest education level: Not on file  Occupational History   Not on file  Tobacco Use   Smoking status: Light Tobacco Smoker    Packs/day: 0.50   Smokeless tobacco: Current User  Substance and Sexual Activity   Alcohol use: Yes   Drug use: No   Sexual activity: Not on file    Comment: Sports [employer]otion @ [employer], Masters degree, married, no regular exercise.  Other Topics Concern   Not on file  Social History Narrative   Not on file   Social Determinants of Health   Financial Resource Strain:    Difficulty of Paying Living Expenses: Not on file  Food Insecurity:    Worried About Running Out of Food in the Last Year: Not on file   Ran Out of Food in the Last Year: Not on file  Transportation Needs:    Lack of Transportation (Medical): Not on file   Lack of Transportation (Non-Medical): Not on file  Physical Activity:    Days of Exercise per Week: Not on file   Minutes of Exercise per Session: Not on file  Stress:  Feeling of Stress : Not on file  Social Connections:    Frequency of Communication with Friends and Family: Not on file   Frequency of Social Gatherings with Friends and Family: Not on file   Attends Religious Services: Not on file   Active Member of Clubs or Organizations: Not on file   Attends Banker Meetings: Not on file   Marital Status: Not on file  Intimate Partner Violence:    Fear of Current or Ex-Partner: Not on file   Emotionally Abused: Not on file   Physically Abused: Not on file   Sexually Abused: Not on file    Outpatient Medications Prior to Visit  Medication Sig Dispense Refill   albuterol (VENTOLIN HFA) 108 (90 Base) MCG/ACT inhaler INHALE 2 PUFFS INTO THE LUNGS EVERY 6 HOURS AS NEEDED FOR WHEEZING. (Patient taking differently: Inhale 2 puffs into the lungs every 6 (six) hours as needed for wheezing or shortness of  breath. INHALE 2 PUFFS INTO THE LUNGS EVERY 6 HOURS AS NEEDED FOR WHEEZING.) 18 g 5   AMBULATORY NON FORMULARY MEDICATION Medication Name: Glucometer with strips and lancets to check daily.  Dx: Diabetes 250.00 1 Units 3   atorvastatin (LIPITOR) 40 MG tablet TAKE 1 TABLET BY MOUTH EVERY DAY 90 tablet 2   clonazePAM (KLONOPIN) 0.5 MG tablet TAKE 1 TABLET (0.5 MG TOTAL) BY MOUTH DAILY AS NEEDED FOR ANXIETY. THIS IS A 30 DAY SUPPLY (Patient taking differently: Take 0.5 mg by mouth daily as needed for anxiety. ) 20 tablet 0   FLUoxetine (PROZAC) 40 MG capsule TAKE 1 CAPSULE BY MOUTH EVERY DAY 90 capsule 1   ibuprofen (ADVIL) 600 MG tablet Take 1 tablet (600 mg total) by mouth every 8 (eight) hours as needed. (Patient taking differently: Take 600 mg by mouth every 8 (eight) hours as needed for moderate pain. ) 30 tablet 0   Insulin Pen Needle (B-D ULTRAFINE III SHORT PEN) 31G X 8 MM MISC USE  TO ADMINISTER INSULIN, once daily. Dx E11.8 100 each 11   JANUMET XR 50-1000 MG TB24 TAKE 1 TABLET BY MOUTH EVERY DAY 30 tablet 1   losartan-hydrochlorothiazide (HYZAAR) 100-25 MG tablet TAKE 1 TABLET BY MOUTH EVERY DAY (Patient taking differently: Take 1 tablet by mouth daily. ) 90 tablet 1   sildenafil (REVATIO) 20 MG tablet TAKE 2 TO 5 TABLETS BY MOUTH ONCE DAILY AS NEEDED 50 tablet 0   TOUJEO SOLOSTAR 300 UNIT/ML Solostar Pen INJECT 36 UNITS INTO THE SKIN AT BEDTIME. (Patient taking differently: Inject 36 Units into the skin at bedtime. ) 15 pen 1   zolpidem (AMBIEN) 10 MG tablet TAKE 1 TABLET BY MOUTH AT BEDTIME AS NEEDED FOR SLEEP 30 tablet 1   amLODipine (NORVASC) 10 MG tablet TAKE 1 TABLET BY MOUTH EVERY DAY 90 tablet 0   HYDROcodone-acetaminophen (NORCO) 5-325 MG tablet 1-2 tabs po q6 hours prn pain 20 tablet 0   No facility-administered medications prior to visit.    Allergies  Allergen Reactions   Gabapentin Other (See Comments)    Drowsiness    Sulfonamide Derivatives     REACTION:  pt. can't remember reaction    ROS Review of Systems    Objective:    Physical Exam Constitutional:      Appearance: He is well-developed.  HENT:     Head: Normocephalic and atraumatic.  Cardiovascular:     Rate and Rhythm: Normal rate and regular rhythm.     Heart sounds: Normal heart  sounds.  Pulmonary:     Effort: Pulmonary effort is normal.     Breath sounds: Normal breath sounds.  Skin:    General: Skin is warm and dry.  Neurological:     Mental Status: He is alert and oriented to person, place, and time.  Psychiatric:        Behavior: Behavior normal.     BP (!) 141/78    Pulse 91    Ht 5\' 9"  (1.753 m)    Wt 191 lb (86.6 kg)    SpO2 93%    BMI 28.21 kg/m  Wt Readings from Last 3 Encounters:  12/03/19 191 lb (86.6 kg)  08/10/19 188 lb (85.3 kg)  08/10/19 188 lb 11.2 oz (85.6 kg)     Health Maintenance Due  Topic Date Due   COVID-19 Vaccine (1) Never done   OPHTHALMOLOGY EXAM  05/03/2013   COLONOSCOPY  Never done    There are no preventive care reminders to display for this patient.  Lab Results  Component Value Date   TSH 2.23 02/21/2019   Lab Results  Component Value Date   WBC 7.0 08/12/2019   HGB 11.9 (L) 08/12/2019   HCT 36.9 (L) 08/12/2019   MCV 90.4 08/12/2019   PLT 190 08/12/2019   Lab Results  Component Value Date   NA 137 08/10/2019   K 3.7 08/10/2019   CO2 27 08/10/2019   GLUCOSE 66 (L) 08/10/2019   BUN 13 08/10/2019   CREATININE 0.92 08/10/2019   BILITOT 0.5 08/10/2019   ALKPHOS 80 08/10/2019   AST 33 08/10/2019   ALT 34 08/10/2019   PROT 7.1 08/10/2019   ALBUMIN 4.0 08/10/2019   CALCIUM 9.2 08/10/2019   ANIONGAP 10 08/10/2019   Lab Results  Component Value Date   CHOL 127 02/21/2019   Lab Results  Component Value Date   HDL 40 02/21/2019   Lab Results  Component Value Date   LDLCALC 74 02/21/2019   Lab Results  Component Value Date   TRIG 54 02/21/2019   Lab Results  Component Value Date   CHOLHDL 3.2  02/21/2019   Lab Results  Component Value Date   HGBA1C 8.5 (A) 12/03/2019      Assessment & Plan:   Problem List Items Addressed This Visit      Cardiovascular and Mediastinum   HYPERTENSION, BENIGN ESSENTIAL    Not well controlled today.  Asked him to turn in 2 weeks for repeat check as well as monitor at home.  He says his recent blood pressures have actually been pretty good.  But we may need to consider restarting a low-dose of amlodipine versus the 10 mg if blood pressure stays elevated.        Endocrine   Type 2 diabetes mellitus with complication (HCC) - Primary    A1c is elevated today at 8.5 it is uncontrolled.  This is up from 7.4 back in May.  Just discussed and encouraged him to get back on track with his medications as well as dietary choices will follow back up in 3 months.  He says he does not need refills today but he will need a new prescription for updated pen needles.        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        Other Visit Diagnoses  Need for immunization against influenza       Relevant Orders   Flu Vaccine QUAD 36+ mos IM (Completed)      No orders of the defined types were placed in this encounter.   Follow-up: Return in about 2 weeks (around 12/17/2019) for recheck BP nurse visit.    Nani Gasser, MD

## 2019-12-03 NOTE — Assessment & Plan Note (Signed)
Not well controlled today.  Asked him to turn in 2 weeks for repeat check as well as monitor at home.  He says his recent blood pressures have actually been pretty good.  But we may need to consider restarting a low-dose of amlodipine versus the 10 mg if blood pressure stays elevated.

## 2019-12-03 NOTE — Assessment & Plan Note (Signed)
S/P amputation.

## 2019-12-04 ENCOUNTER — Encounter: Payer: Self-pay | Admitting: Family Medicine

## 2019-12-05 MED ORDER — SILDENAFIL CITRATE 20 MG PO TABS: ORAL_TABLET | ORAL | 3 refills | Status: DC

## 2019-12-05 NOTE — Addendum Note (Signed)
Addended by: Deno Etienne on: 12/05/2019 12:24 PM   Modules accepted: Orders

## 2019-12-13 ENCOUNTER — Encounter: Payer: Self-pay | Admitting: Family Medicine

## 2019-12-13 ENCOUNTER — Ambulatory Visit (INDEPENDENT_AMBULATORY_CARE_PROVIDER_SITE_OTHER): Payer: BLUE CROSS/BLUE SHIELD | Admitting: Family Medicine

## 2019-12-13 VITALS — BP 121/68 | HR 74 | Ht 69.0 in | Wt 188.0 lb

## 2019-12-13 DIAGNOSIS — L03113 Cellulitis of right upper limb: Secondary | ICD-10-CM | POA: Diagnosis not present

## 2019-12-13 DIAGNOSIS — S61501A Unspecified open wound of right wrist, initial encounter: Secondary | ICD-10-CM | POA: Diagnosis not present

## 2019-12-13 MED ORDER — CEFTRIAXONE SODIUM 1 G IJ SOLR
1.0000 g | Freq: Once | INTRAMUSCULAR | Status: AC
  Administered 2019-12-13: 1 g via INTRAMUSCULAR

## 2019-12-13 MED ORDER — DOXYCYCLINE HYCLATE 100 MG PO TABS: 100.0000 mg | ORAL_TABLET | Freq: Two times a day (BID) | ORAL | 0 refills | Status: DC

## 2019-12-13 NOTE — Patient Instructions (Signed)
Please pick up Hibiclens at the pharmacy it is a liquid surgical soap.  Most pharmacies have their own brand.  Just clean the area gently with your fingertips and the soap and then rinse with water and pat dry.  Then apply Vaseline twice a day.  Cover if needed to protect the wound from dirt or debris or dust.  Follow-up on Monday for wound recheck.

## 2019-12-13 NOTE — Progress Notes (Signed)
Acute Office Visit  Subjective:    Patient ID: Larry Lawson, male    DOB: 01-16-1971, 49 y.o.   MRN: [id number]  Chief Complaint  Patient presents with  . Laceration    HPI Patient is in today for laceration to right wrist 2 days ago.  He says he cleaned the wound.  Using a grinder. Was grinding metal.  Saw some pus yesterday. No fever or chills. No odor to the wound.      Past Medical History:  Diagnosis Date  . Diabetes mellitus   . Heart murmur   . Heart murmur   . Hypertension     Past Surgical History:  Procedure Laterality Date  . I & D EXTREMITY Left 08/11/2019   Procedure: IRRIGATION AND DEBRIDEMENT LONG FINGER, AMPUTATION;  Surgeon: Betha Loa, MD;  Location: [facility] OR;  Service: Orthopedics;  Laterality: Left;    Family History  Problem Relation Age of Onset  . Hyperlipidemia Mother   . Heart failure Mother   . Hypertension Father   . Prostate cancer Father   . Cancer Other        grandmother  . Diabetes Brother   . Heart attack Other        grandfather  . Heart attack Brother 46  . Heart failure Brother   . Heart disease Paternal Uncle     Social History   Socioeconomic History  . Marital status: Married    Spouse name: Not on file  . Number of children: Not on file  . Years of education: Not on file  . Highest education level: Not on file  Occupational History  . Not on file  Tobacco Use  . Smoking status: Light Tobacco Smoker    Packs/day: 0.50  . Smokeless tobacco: Current User  Substance and Sexual Activity  . Alcohol use: Yes  . Drug use: No  . 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  Other Topics Concern  . Not on file  Social History Narrative  . Not on file   Social Determinants of Health   Financial Resource Strain:   . Difficulty of Paying Living Expenses: Not on file  Food Insecurity:   . Worried About Programme researcher, broadcasting/film/video in the Last Year:  Not on file  . Ran Out of Food in the Last Year: Not on file  Transportation Needs:   . Lack of Transportation (Medical): Not on file  . Lack of Transportation (Non-Medical): Not on file  Physical Activity:   . Days of Exercise per Week: Not on file  . Minutes of Exercise per Session: Not on file  Stress:   . Feeling of Stress : Not on file  Social Connections:   . Frequency of Communication with Friends and Family: Not on file  . Frequency of Social Gatherings with Friends and Family: Not on file  . Attends Religious Services: Not on file  . Active Member of Clubs or Organizations: Not on file  . Attends Banker Meetings: Not on file  . Marital Status: Not on file  Intimate Partner Violence:   . Fear of Current or Ex-Partner: Not on file  . Emotionally Abused: Not on file  . Physically Abused: Not on file  . Sexually Abused: Not on file    Outpatient Medications Prior to Visit  Medication Sig Dispense Refill  . albuterol (VENTOLIN HFA) 108 (90 Base) MCG/ACT  inhaler INHALE 2 PUFFS INTO THE LUNGS EVERY 6 HOURS AS NEEDED FOR WHEEZING. (Patient taking differently: Inhale 2 puffs into the lungs every 6 (six) hours as needed for wheezing or shortness of breath. INHALE 2 PUFFS INTO THE LUNGS EVERY 6 HOURS AS NEEDED FOR WHEEZING.) 18 g 5  . AMBULATORY NON FORMULARY MEDICATION Medication Name: Glucometer with strips and lancets to check daily.  Dx: Diabetes 250.00 1 Units 3  . atorvastatin (LIPITOR) 40 MG tablet TAKE 1 TABLET BY MOUTH EVERY DAY 90 tablet 2  . clonazePAM (KLONOPIN) 1 MG tablet Take 1 mg by mouth 4 (four) times daily as needed.    . doxepin (SINEQUAN) 25 MG capsule Take 25 mg by mouth at bedtime.    Marland Kitchen FLUoxetine (PROZAC) 40 MG capsule TAKE 1 CAPSULE BY MOUTH EVERY DAY 90 capsule 1  . Insulin Pen Needle (B-D ULTRAFINE III SHORT PEN) 31G X 8 MM MISC USE  TO ADMINISTER INSULIN, once daily. Dx E11.8 100 each 11  . JANUMET XR 50-1000 MG TB24 TAKE 1 TABLET BY MOUTH  EVERY DAY 30 tablet 1  . losartan-hydrochlorothiazide (HYZAAR) 100-25 MG tablet TAKE 1 TABLET BY MOUTH EVERY DAY (Patient taking differently: Take 1 tablet by mouth daily. ) 90 tablet 1  . sildenafil (REVATIO) 20 MG tablet TAKE 2 TO 5 TABLETS BY MOUTH ONCE DAILY AS NEEDED 50 tablet 3  . TOUJEO SOLOSTAR 300 UNIT/ML Solostar Pen INJECT 36 UNITS INTO THE SKIN AT BEDTIME. (Patient taking differently: Inject 36 Units into the skin at bedtime. ) 15 pen 1  . zolpidem (AMBIEN) 10 MG tablet TAKE 1 TABLET BY MOUTH AT BEDTIME AS NEEDED FOR SLEEP 30 tablet 1  . clonazePAM (KLONOPIN) 0.5 MG tablet TAKE 1 TABLET (0.5 MG TOTAL) BY MOUTH DAILY AS NEEDED FOR ANXIETY. THIS IS A 30 DAY SUPPLY (Patient taking differently: Take 0.5 mg by mouth daily as needed for anxiety. ) 20 tablet 0  . ibuprofen (ADVIL) 600 MG tablet Take 1 tablet (600 mg total) by mouth every 8 (eight) hours as needed. (Patient taking differently: Take 600 mg by mouth every 8 (eight) hours as needed for moderate pain. ) 30 tablet 0   No facility-administered medications prior to visit.    Allergies  Allergen Reactions  . Gabapentin Other (See Comments)    Drowsiness   . Sulfonamide Derivatives     REACTION: pt. can't remember reaction    Review of Systems     Objective:    Physical Exam Vitals reviewed.  Constitutional:      Appearance: He is well-developed.  HENT:     Head: Normocephalic and atraumatic.  Eyes:     Conjunctiva/sclera: Conjunctivae normal.  Cardiovascular:     Rate and Rhythm: Normal rate.  Pulmonary:     Effort: Pulmonary effort is normal.  Musculoskeletal:     Comments: Wrist with NROM.  Skin:    General: Skin is dry.     Coloration: Skin is not pale.     Comments: Open wound on dorsum of right wrist. Some blood with pressure on the wound but not pus.    Neurological:     Mental Status: He is alert and oriented to person, place, and time.  Psychiatric:        Behavior: Behavior normal.           BP 121/68   Pulse 74   Ht 5\' 9"  (1.753 m)   Wt 188 lb (85.3 kg)   SpO2 99%  BMI 27.76 kg/m  Wt Readings from Last 3 Encounters:  12/13/19 188 lb (85.3 kg)  12/03/19 191 lb (86.6 kg)  08/10/19 188 lb (85.3 kg)    Health Maintenance Due  Topic Date Due  . COVID-19 Vaccine (1) Never done  . OPHTHALMOLOGY EXAM  05/03/2013  . COLONOSCOPY  Never done      There are no preventive care reminders to display for this patient.   Lab Results  Component Value Date   TSH 2.23 02/21/2019   Lab Results  Component Value Date   WBC 7.0 08/12/2019   HGB 11.9 (L) 08/12/2019   HCT 36.9 (L) 08/12/2019   MCV 90.4 08/12/2019   PLT 190 08/12/2019   Lab Results  Component Value Date   NA 137 08/10/2019   K 3.7 08/10/2019   CO2 27 08/10/2019   GLUCOSE 66 (L) 08/10/2019   BUN 13 08/10/2019   CREATININE 0.92 08/10/2019   BILITOT 0.5 08/10/2019   ALKPHOS 80 08/10/2019   AST 33 08/10/2019   ALT 34 08/10/2019   PROT 7.1 08/10/2019   ALBUMIN 4.0 08/10/2019   CALCIUM 9.2 08/10/2019   ANIONGAP 10 08/10/2019   Lab Results  Component Value Date   CHOL 127 02/21/2019   Lab Results  Component Value Date   HDL 40 02/21/2019   Lab Results  Component Value Date   LDLCALC 74 02/21/2019   Lab Results  Component Value Date   TRIG 54 02/21/2019   Lab Results  Component Value Date   CHOLHDL 3.2 02/21/2019   Lab Results  Component Value Date   HGBA1C 8.5 (A) 12/03/2019       Assessment & Plan:   Problem List Items Addressed This Visit    None    Visit Diagnoses    Open wound of right wrist, initial encounter    -  Primary   Cellulitis of right upper extremity       Relevant Medications   cefTRIAXone (ROCEPHIN) injection 1 g (Completed)     Open wound of right wrist with cellulitis.  Just a little serous drainage on exam today no pus.  I do not feel any induration under the skin to indicate abscess.  Just some mild swelling and some mild erythema.  Because of my  concern for penicillin infection, especially with recently elevated hemoglobin A1c given 1 g of Rocephin IM and encouraged to pick up the doxycycline as soon as possible today and get started on it.  Normally I would have him follow-up in 48 hours with that would be Saturday so we will have him return on Monday gave him warning signs and symptoms for when to go to the ED he recently had a infection of his finger back in May that ended up resulting in a amputation and so 1 avoid that if at all possible.  Discussed the importance of really keeping his blood sugars under control while fighting this infection.  Really needs to cut back on carbs and sweets and try to eat healthy and drink plenty of water.  Meds ordered this encounter  Medications  . DISCONTD: doxycycline (VIBRA-TABS) 100 MG tablet    Sig: Take 1 tablet (100 mg total) by mouth 2 (two) times daily.    Dispense:  20 tablet    Refill:  0  . doxycycline (VIBRA-TABS) 100 MG tablet    Sig: Take 1 tablet (100 mg total) by mouth 2 (two) times daily.    Dispense:  20 tablet  Refill:  0  . cefTRIAXone (ROCEPHIN) injection 1 g    Order Specific Question:   Antibiotic Indication:    Answer:   Cellulitis     Nani Gasser, MD

## 2019-12-18 ENCOUNTER — Ambulatory Visit: Payer: BLUE CROSS/BLUE SHIELD | Admitting: Family Medicine

## 2019-12-26 ENCOUNTER — Encounter: Payer: BLUE CROSS/BLUE SHIELD | Admitting: Family Medicine

## 2020-01-05 ENCOUNTER — Other Ambulatory Visit: Payer: Self-pay | Admitting: Family Medicine

## 2020-01-07 MED ORDER — ZOLPIDEM TARTRATE 10 MG PO TABS
ORAL_TABLET | ORAL | 1 refills | Status: DC
Start: 2020-01-07 — End: 2020-03-06

## 2020-01-10 ENCOUNTER — Encounter: Payer: Self-pay | Admitting: Family Medicine

## 2020-02-16 ENCOUNTER — Other Ambulatory Visit: Payer: Self-pay | Admitting: Family Medicine

## 2020-03-06 ENCOUNTER — Other Ambulatory Visit: Payer: Self-pay | Admitting: Family Medicine

## 2020-03-21 ENCOUNTER — Other Ambulatory Visit: Payer: Self-pay | Admitting: Family Medicine

## 2020-03-22 ENCOUNTER — Other Ambulatory Visit: Payer: Self-pay | Admitting: Family Medicine

## 2020-04-19 ENCOUNTER — Other Ambulatory Visit: Payer: Self-pay | Admitting: Family Medicine

## 2020-05-30 ENCOUNTER — Other Ambulatory Visit: Payer: Self-pay | Admitting: Family Medicine

## 2020-06-05 ENCOUNTER — Other Ambulatory Visit: Payer: Self-pay

## 2020-06-05 DIAGNOSIS — N529 Male erectile dysfunction, unspecified: Secondary | ICD-10-CM

## 2020-06-05 DIAGNOSIS — I10 Essential (primary) hypertension: Secondary | ICD-10-CM

## 2020-06-05 MED ORDER — SILDENAFIL CITRATE 20 MG PO TABS: ORAL_TABLET | ORAL | 0 refills | Status: DC

## 2020-06-19 ENCOUNTER — Ambulatory Visit (INDEPENDENT_AMBULATORY_CARE_PROVIDER_SITE_OTHER): Payer: Self-pay | Admitting: Physician Assistant

## 2020-06-19 DIAGNOSIS — T148XXA Other injury of unspecified body region, initial encounter: Secondary | ICD-10-CM

## 2020-06-19 DIAGNOSIS — L03221 Cellulitis of neck: Secondary | ICD-10-CM

## 2020-06-19 DIAGNOSIS — Z8614 Personal history of Methicillin resistant Staphylococcus aureus infection: Secondary | ICD-10-CM

## 2020-06-19 MED ORDER — DOXYCYCLINE HYCLATE 100 MG PO TABS: 100.0000 mg | ORAL_TABLET | Freq: Two times a day (BID) | ORAL | 0 refills | Status: DC

## 2020-06-19 NOTE — Progress Notes (Signed)
..Virtual Visit via Video Note  I connected with Larry Lawson on 06/19/2020 at  3:00 PM EDT by a video enabled telemedicine application and verified that I am speaking with the correct person using two identifiers.  Location: Patient: home Provider:clinic  .Marland KitchenParticipating in visit:  Patient: Larry Lawson Provider: Tandy Gaw PA-C   I discussed the limitations of evaluation and management by telemedicine and the availability of in person appointments. The patient expressed understanding and agreed to proceed.  History of Present Illness: Pt is a 50 yo male with T2DM, current smoker, hx of wound infection and amputation who calls into the clinic with red, warm, draining papule of his left posterior neck. He is putting topical antibiotic ointment on it with no improvement. Denies any fever, chills, vomiting, nausea. He is concerned about infection. He admits to scraping it with some metal at work the other day.   .. Active Ambulatory Problems    Diagnosis Date Noted  . OBESITY NOS 05/04/2006  . HYPERTENSION, BENIGN ESSENTIAL 05/04/2006  . Depression 03/17/2011  . Tobacco abuse 09/12/2012  . Type 2 diabetes mellitus with complication (HCC) 06/21/2014  . Exposure to hepatitis C 06/25/2014  . Hyperlipemia 04/11/2015  . Lumbar pain with radiation down left leg 04/15/2015  . Trigger thumb of left hand 04/15/2015  . Erectile dysfunction 05/21/2015  . Anxiety 10/24/2018  . Acute hypoxemic respiratory failure (HCC) 03/05/2018  . Acute hepatitis A 03/20/2019  . Osteomyelitis of finger of left hand (HCC) 08/10/2019  . Osteomyelitis (HCC) 08/11/2019  . Septic arthritis (HCC) 08/11/2019  . Hypoglycemia due to insulin 08/11/2019  . Pulp abscess of finger of left hand 08/15/2019  . Cellulitis of neck 06/19/2020   Resolved Ambulatory Problems    Diagnosis Date Noted  . DM (diabetes mellitus) type 2, uncontrolled, with ketoacidosis (HCC) 05/11/2007  . EYE PAIN, LEFT 08/24/2006  . Impaired fasting  glucose 05/12/2007  . Other abnormal glucose 05/11/2007  . Pneumonia 03/10/2018  . Type 2 diabetes mellitus (HCC) 08/11/2019  . Staph infection 08/23/2019   Past Medical History:  Diagnosis Date  . Diabetes mellitus   . Heart murmur   . Heart murmur   . Hypertension     Observations/Objective: No acute distress Hard to visulize but appears to be oozing skin wound with surrounding redness and excoriations on his left posterior neck.   Marland Kitchen.There were no vitals filed for this visit. There is no height or weight on file to calculate BMI.    Assessment and Plan: .Marland KitchenMack was seen today for cyst.  Diagnoses and all orders for this visit:  Cellulitis of neck -     doxycycline (VIBRA-TABS) 100 MG tablet; Take 1 tablet (100 mg total) by mouth 2 (two) times daily.  Abrasion  History of MRSA infection -     doxycycline (VIBRA-TABS) 100 MG tablet; Take 1 tablet (100 mg total) by mouth 2 (two) times daily.   Long easter weekend. Start doxycyline for MRSA coverage. Keep wound clean with soap and water. Keep covered to not let additional bacteria get into wound. Tdap up to date. If not better by Monday needs to be seen in person or if redness, drainage worsening or fever go to UC.     Follow Up Instructions:    I discussed the assessment and treatment plan with the patient. The patient was provided an opportunity to ask questions and all were answered. The patient agreed with the plan and demonstrated an understanding of the instructions.   The  patient was advised to call back or seek an in-person evaluation if the symptoms worsen or if the condition fails to improve as anticipated.   Iran Planas, PA-C

## 2020-06-23 ENCOUNTER — Encounter: Payer: Self-pay | Admitting: Physician Assistant

## 2020-07-31 ENCOUNTER — Other Ambulatory Visit: Payer: Self-pay | Admitting: Family Medicine

## 2020-07-31 ENCOUNTER — Ambulatory Visit (INDEPENDENT_AMBULATORY_CARE_PROVIDER_SITE_OTHER): Payer: Self-pay | Admitting: Family Medicine

## 2020-07-31 ENCOUNTER — Encounter: Payer: Self-pay | Admitting: Family Medicine

## 2020-07-31 ENCOUNTER — Other Ambulatory Visit: Payer: Self-pay

## 2020-07-31 ENCOUNTER — Ambulatory Visit: Payer: Self-pay | Admitting: Family Medicine

## 2020-07-31 VITALS — BP 174/83 | HR 78 | Temp 98.5°F | Ht 69.0 in | Wt 157.5 lb

## 2020-07-31 DIAGNOSIS — E118 Type 2 diabetes mellitus with unspecified complications: Secondary | ICD-10-CM

## 2020-07-31 DIAGNOSIS — L089 Local infection of the skin and subcutaneous tissue, unspecified: Secondary | ICD-10-CM

## 2020-07-31 DIAGNOSIS — I10 Essential (primary) hypertension: Secondary | ICD-10-CM

## 2020-07-31 DIAGNOSIS — R634 Abnormal weight loss: Secondary | ICD-10-CM

## 2020-07-31 MED ORDER — HYDROCHLOROTHIAZIDE 25 MG PO TABS: 12.5000 mg | ORAL_TABLET | Freq: Every day | ORAL | 1 refills | Status: DC

## 2020-07-31 MED ORDER — TOUJEO SOLOSTAR 300 UNIT/ML ~~LOC~~ SOPN: PEN_INJECTOR | SUBCUTANEOUS | 0 refills | Status: DC

## 2020-07-31 MED ORDER — METFORMIN HCL 1000 MG PO TABS: 1000.0000 mg | ORAL_TABLET | Freq: Two times a day (BID) | ORAL | 3 refills | Status: DC

## 2020-07-31 MED ORDER — LOSARTAN POTASSIUM 100 MG PO TABS: 100.0000 mg | ORAL_TABLET | Freq: Every day | ORAL | 1 refills | Status: DC

## 2020-07-31 MED ORDER — DOXYCYCLINE HYCLATE 100 MG PO TABS: 100.0000 mg | ORAL_TABLET | Freq: Two times a day (BID) | ORAL | 0 refills | Status: AC

## 2020-07-31 NOTE — Patient Instructions (Signed)
Labs today.  Prescriptions given If your your symptoms worsen at all or don't begin improving - go to the ED.

## 2020-07-31 NOTE — Progress Notes (Addendum)
Acute Office Visit  Subjective:    Patient ID: Larry Lawson, male    DOB: Oct 19, 1970, 50 y.o.   MRN: [id number]  CC: wounds  HPI Patient is in today for wounds.  Patient states about 2 weeks ago he noticed some pain to his right buttocks - thinks he initially had a burn there that has become infected. He states it started draining and he thinks the drainage caused other wounds to erupt over the past week. He reports other similar wounds to suprapubic area, perineal area behind scrotum, and left buttocks. Reports it is extremely painful (8/10) to sit down. States he has felt feverish/chills a few days ago, but has not checked his temperature. He has also been experiencing fatigue and body aches. Denies any tachycardia, chest pain, trouble breathing, diaphoresis, other rashes or wounds.  He denies any illegal substance use.  Reports he has not been taking any of his medications because he left them in a hotel a few months and they would not return them to him. He lost his insurance so he has not asked for any new prescriptions to be sent in.      Past Medical History:  Diagnosis Date  . Diabetes mellitus   . Heart murmur   . Heart murmur   . Hypertension     Past Surgical History:  Procedure Laterality Date  . I & D EXTREMITY Left 08/11/2019   Procedure: IRRIGATION AND DEBRIDEMENT LONG FINGER, AMPUTATION;  Surgeon: Betha Loa, MD;  Location: [facility] OR;  Service: Orthopedics;  Laterality: Left;    Family History  Problem Relation Age of Onset  . Hyperlipidemia Mother   . Heart failure Mother   . Hypertension Father   . Prostate cancer Father   . Cancer Other        grandmother  . Diabetes Brother   . Heart attack Other        grandfather  . Heart attack Brother 46  . Heart failure Brother   . Heart disease Paternal Uncle     Social History   Socioeconomic History  . Marital status: Married    Spouse name: Not on file  . Number of children: Not on file  . Years of  education: Not on file  . Highest education level: Not on file  Occupational History  . Not on file  Tobacco Use  . Smoking status: Light Tobacco Smoker    Packs/day: 0.50  . Smokeless tobacco: Current User  Substance and Sexual Activity  . Alcohol use: Yes  . Drug use: No  . 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  Other Topics Concern  . Not on file  Social History Narrative  . Not on file   Social Determinants of Health   Financial Resource Strain: Not on file  Food Insecurity: Not on file  Transportation Needs: Not on file  Physical Activity: Not on file  Stress: Not on file  Social Connections: Not on file  Intimate Partner Violence: Not on file    Outpatient Medications Prior to Visit  Medication Sig Dispense Refill  . albuterol (VENTOLIN HFA) 108 (90 Base) MCG/ACT inhaler INHALE 2 PUFFS INTO THE LUNGS EVERY 6 HOURS AS NEEDED FOR WHEEZING. (Patient not taking: Reported on 07/31/2020) 18 g 5  . AMBULATORY NON FORMULARY MEDICATION Medication Name: Glucometer with strips and lancets to check daily.  Dx: Diabetes 250.00 (Patient not taking: Reported  on 07/31/2020) 1 Units 3  . atorvastatin (LIPITOR) 40 MG tablet TAKE 1 TABLET BY MOUTH EVERY DAY (Patient not taking: Reported on 07/31/2020) 90 tablet 2  . clonazePAM (KLONOPIN) 1 MG tablet Take 1 mg by mouth 4 (four) times daily as needed. (Patient not taking: Reported on 07/31/2020)    . doxepin (SINEQUAN) 25 MG capsule Take 25 mg by mouth at bedtime. (Patient not taking: Reported on 07/31/2020)    . doxycycline (VIBRA-TABS) 100 MG tablet Take 1 tablet (100 mg total) by mouth 2 (two) times daily. (Patient not taking: Reported on 07/31/2020) 20 tablet 0  . doxycycline (VIBRA-TABS) 100 MG tablet Take 1 tablet (100 mg total) by mouth 2 (two) times daily. (Patient not taking: Reported on 07/31/2020) 20 tablet 0  . FLUoxetine (PROZAC) 40 MG capsule TAKE 1  CAPSULE BY MOUTH EVERY DAY (Patient not taking: Reported on 07/31/2020) 90 capsule 1  . Insulin Pen Needle (B-D ULTRAFINE III SHORT PEN) 31G X 8 MM MISC USE  TO ADMINISTER INSULIN, once daily. Dx E11.8 (Patient not taking: Reported on 07/31/2020) 100 each 11  . JANUMET XR 50-1000 MG TB24 TAKE 1 TABLET BY MOUTH EVERY DAY (Patient not taking: Reported on 07/31/2020) 30 tablet 1  . losartan-hydrochlorothiazide (HYZAAR) 100-25 MG tablet TAKE 1 TABLET BY MOUTH EVERY DAY (Patient not taking: Reported on 07/31/2020) 90 tablet 1  . sildenafil (REVATIO) 20 MG tablet TAKE 2 TO 5 TABLETS BY MOUTH ONCE DAILY AS NEEDED, APPT FOR FUTURE REFILLS (Patient not taking: Reported on 07/31/2020) 50 tablet 0  . TOUJEO SOLOSTAR 300 UNIT/ML Solostar Pen INJECT 36 UNITS INTO THE SKIN AT BEDTIME. (Patient not taking: Reported on 07/31/2020) 15 pen 1  . zolpidem (AMBIEN) 10 MG tablet TAKE 1 TABLET BY MOUTH EVERY DAY AT BEDTIME AS NEEDED FOR SLEEP (Patient not taking: Reported on 07/31/2020) 30 tablet 1   No facility-administered medications prior to visit.    Allergies  Allergen Reactions  . Gabapentin Other (See Comments)    Drowsiness   . Sulfonamide Derivatives     REACTION: pt. can't remember reaction    Review of Systems All review of systems negative except what is listed in the HPI     Objective:    Physical Exam Constitutional:      Appearance: Normal appearance. He is normal weight.  HENT:     Head: Normocephalic and atraumatic.  Skin:    General: Skin is warm and dry.     Findings: Lesion present.     Comments: Multiple wounds with slough. See images below  Neurological:     General: No focal deficit present.     Mental Status: He is alert and oriented to person, place, and time.  Psychiatric:        Mood and Affect: Mood normal.        Behavior: Behavior normal.        Thought Content: Thought content normal.        Judgment: Judgment normal.    "newest" wound per patient; perineal  area:    Midline suprapubic:    Left upper leg/glute:    Right buttocks - approximately 7cm area of induration under open wound where erythema is visible     BP (!) 174/71   Pulse 77   Temp 98.5 F (36.9 C) (Oral)   Ht 5\' 9"  (1.753 m)   Wt 157 lb 8 oz (71.4 kg)   SpO2 98%   BMI 23.26 kg/m  Wt Readings from  Last 3 Encounters:  07/31/20 157 lb 8 oz (71.4 kg)  12/13/19 188 lb (85.3 kg)  12/03/19 191 lb (86.6 kg)    Health Maintenance Due  Topic Date Due  . COVID-19 Vaccine (1) Never done  . OPHTHALMOLOGY EXAM  05/03/2013  . Zoster Vaccines- Shingrix (1 of 2) Never done  . HEMOGLOBIN A1C  06/01/2020  . FOOT EXAM  07/22/2020    There are no preventive care reminders to display for this patient.   Lab Results  Component Value Date   TSH 2.23 02/21/2019   Lab Results  Component Value Date   WBC 7.0 08/12/2019   HGB 11.9 (L) 08/12/2019   HCT 36.9 (L) 08/12/2019   MCV 90.4 08/12/2019   PLT 190 08/12/2019   Lab Results  Component Value Date   NA 137 08/10/2019   K 3.7 08/10/2019   CO2 27 08/10/2019   GLUCOSE 66 (L) 08/10/2019   BUN 13 08/10/2019   CREATININE 0.92 08/10/2019   BILITOT 0.5 08/10/2019   ALKPHOS 80 08/10/2019   AST 33 08/10/2019   ALT 34 08/10/2019   PROT 7.1 08/10/2019   ALBUMIN 4.0 08/10/2019   CALCIUM 9.2 08/10/2019   ANIONGAP 10 08/10/2019   Lab Results  Component Value Date   CHOL 127 02/21/2019   Lab Results  Component Value Date   HDL 40 02/21/2019   Lab Results  Component Value Date   LDLCALC 74 02/21/2019   Lab Results  Component Value Date   TRIG 54 02/21/2019   Lab Results  Component Value Date   CHOLHDL 3.2 02/21/2019   Lab Results  Component Value Date   HGBA1C 8.5 (A) 12/03/2019       Assessment & Plan:   1. Wound infection Attempted culture of right buttocks wound, but covered in slough so unsure if sample will be adequate. Doxycycline given and some duoderm dressings placed on buttocks for comfort  while sitting. Given various locations, cannot attribute to pressure ulcers. Santyl ointment too expensive at this time - urgent referral to wound care. Patient needs to go to ED if symptoms persist after starting ABX or if he has more fevers, fatigue, etc. Wounds likely need debriding - if he cannot get in with wound care soon, then he needs to go to ED.   - AMB referral to wound care center - CBC with Differential - COMPLETE METABOLIC PANEL WITH GFR - doxycycline (VIBRA-TABS) 100 MG tablet; Take 1 tablet (100 mg total) by mouth 2 (two) times daily for 10 days.  Dispense: 20 tablet; Refill: 0 - Wound culture; Future  2. Type 2 diabetes mellitus with complication (HCC) Has been off of medications. CBC and CMP today. Gave him a sample of Toujeo. Changed oral meds to metformin as he can likely afford this - printed script given for him to take wherever cheapest.   - AMB referral to wound care center - CBC with Differential - COMPLETE METABOLIC PANEL WITH GFR - insulin glargine, 1 Unit Dial, (TOUJEO SOLOSTAR) 300 UNIT/ML Solostar Pen; INJECT 36 UNITS INTO THE SKIN AT BEDTIME.  Dispense: 1 mL; Refill: 0 - metFORMIN (GLUCOPHAGE) 1000 MG tablet; Take 1 tablet (1,000 mg total) by mouth 2 (two) times daily with a meal.  Dispense: 180 tablet; Refill: 3  3. HYPERTENSION, BENIGN ESSENTIAL Pt has been off medications for several months d/t self-pay. I reinforced significance of managing BP and reordered his meds individually and gave him printed prescriptions that he can take to whichever pharmacy  he finds to be cheapest. Checking labs today.  - CBC with Differential - COMPLETE METABOLIC PANEL WITH GFR - losartan (COZAAR) 100 MG tablet; Take 1 tablet (100 mg total) by mouth daily.  Dispense: 90 tablet; Refill: 1 - hydrochlorothiazide (HYDRODIURIL) 25 MG tablet; Take 0.5 tablets (12.5 mg total) by mouth daily.  Dispense: 30 tablet; Refill: 1  4. Weight loss 30 pound weight loss in past 7 months. Patient  denies substance use. CBC/CMP today.  Patient aware of signs/symptoms requiring further/urgent evaluation.  Follow-up next week for wound and BP check or go to the ED if symptoms worsen.   **low threshold for going to the ED - any fevers, nausea, vomiting, diaphoresis, severe pain, tachycardia, tachypnea, fatigue/lethargy, decreased urine output, etc.**   *Addedum 08/01/20- adding Infectious Disease referral as well per Dr. Cecelia Byars (wound care) recommendation  Clayborne Dana, NP

## 2020-08-01 NOTE — Addendum Note (Signed)
Addended by: Hyman Hopes B on: 08/01/2020 09:01 AM   Modules accepted: Orders

## 2020-08-02 ENCOUNTER — Other Ambulatory Visit: Payer: Self-pay | Admitting: Family Medicine

## 2020-08-02 DIAGNOSIS — I10 Essential (primary) hypertension: Secondary | ICD-10-CM

## 2020-08-02 DIAGNOSIS — N529 Male erectile dysfunction, unspecified: Secondary | ICD-10-CM

## 2020-08-03 LAB — WOUND CULTURE
MICRO NUMBER:: 11942085
SPECIMEN QUALITY:: ADEQUATE

## 2020-08-03 LAB — HOUSE ACCOUNT TRACKING

## 2020-08-05 ENCOUNTER — Other Ambulatory Visit: Payer: Self-pay | Admitting: Family Medicine

## 2020-08-05 ENCOUNTER — Ambulatory Visit (INDEPENDENT_AMBULATORY_CARE_PROVIDER_SITE_OTHER): Payer: Self-pay | Admitting: Internal Medicine

## 2020-08-05 ENCOUNTER — Encounter: Payer: Self-pay | Admitting: Family Medicine

## 2020-08-05 ENCOUNTER — Other Ambulatory Visit: Payer: Self-pay

## 2020-08-05 ENCOUNTER — Other Ambulatory Visit: Payer: Self-pay | Admitting: *Deleted

## 2020-08-05 ENCOUNTER — Encounter: Payer: Self-pay | Admitting: Internal Medicine

## 2020-08-05 DIAGNOSIS — N529 Male erectile dysfunction, unspecified: Secondary | ICD-10-CM

## 2020-08-05 DIAGNOSIS — I10 Essential (primary) hypertension: Secondary | ICD-10-CM

## 2020-08-05 DIAGNOSIS — L0291 Cutaneous abscess, unspecified: Secondary | ICD-10-CM

## 2020-08-05 MED ORDER — AMOXICILLIN 500 MG PO CAPS: 500.0000 mg | ORAL_CAPSULE | Freq: Three times a day (TID) | ORAL | 0 refills | Status: DC

## 2020-08-05 MED ORDER — ZOLPIDEM TARTRATE 10 MG PO TABS: ORAL_TABLET | ORAL | 1 refills | Status: DC

## 2020-08-05 NOTE — Telephone Encounter (Signed)
Ambien resent to [facility] in [place].  We just need to call [facility] to make sure that the other 1 was canceled.

## 2020-08-05 NOTE — Progress Notes (Signed)
[facility]      Reason for Consult: buttock infection    Referring Physician:  Irish Lack, NP     Patient ID: Larry Lawson, male    DOB: [birth year], 50 y.o.   MRN: [id number]  HPI:   He is here for buttock and groin wounds.  Developed over the last two weeks and seen by his PCPs office and given doxycycline.  No culture able to be sent.  Was referred to wound care and ID.  Felt to need debridement/I and D, but has not been sent to surgery.  No fever or chills.  Had a similar area on his neck recently.  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  He has not heard from wound care, thought that this appointment was for wound care.  He was 25 minutes late for the appointment.   Pictures reviewed  Past Medical History:  Diagnosis Date  . Diabetes mellitus   . Heart murmur   . Heart murmur   . Hypertension     Prior to Admission medications   Medication Sig Start Date End Date Taking? Authorizing Provider  albuterol (VENTOLIN HFA) 108 (90 Base) MCG/ACT inhaler INHALE 2 PUFFS INTO THE LUNGS EVERY 6 HOURS AS NEEDED FOR WHEEZING. Patient not taking: Reported on 07/31/2020 03/07/19   Agapito Games, MD  AMBULATORY NON FORMULARY MEDICATION Medication Name: Glucometer with strips and lancets to check daily.  Dx: Diabetes 250.00 Patient not taking: Reported on 07/31/2020 06/21/14   Tandy Gaw L, PA-C  atorvastatin (LIPITOR) 40 MG tablet TAKE 1 TABLET BY MOUTH EVERY DAY Patient not taking: Reported on 07/31/2020 10/29/19   Agapito Games, MD  clonazePAM (KLONOPIN) 1 MG tablet Take 1 mg by mouth 4 (four) times daily as needed. Patient not taking: Reported on 07/31/2020 11/19/19   [provider]  doxepin (SINEQUAN) 25 MG capsule Take 25 mg by mouth at bedtime. Patient not taking: Reported on 07/31/2020 12/09/19   [provider]  doxycycline (VIBRA-TABS) 100 MG tablet Take 1 tablet (100 mg total) by  mouth 2 (two) times daily for 10 days. 07/31/20 08/10/20  Clayborne Dana, NP  FLUoxetine (PROZAC) 40 MG capsule TAKE 1 CAPSULE BY MOUTH EVERY DAY Patient not taking: Reported on 07/31/2020 10/10/19   Agapito Games, MD  hydrochlorothiazide (HYDRODIURIL) 25 MG tablet Take 0.5 tablets (12.5 mg total) by mouth daily. 07/31/20   Clayborne Dana, NP  insulin glargine, 1 Unit Dial, (TOUJEO SOLOSTAR) 300 UNIT/ML Solostar Pen INJECT 36 UNITS INTO THE SKIN AT BEDTIME. 07/31/20   Hyman Hopes B, NP  Insulin Pen Needle (B-D ULTRAFINE III SHORT PEN) 31G X 8 MM MISC USE  TO ADMINISTER INSULIN, once daily. Dx E11.8 Patient not taking: Reported on 07/31/2020 07/23/19   Agapito Games, MD  losartan (COZAAR) 100 MG tablet Take 1 tablet (100 mg total) by mouth daily. 07/31/20   Clayborne Dana, NP  metFORMIN (GLUCOPHAGE) 1000 MG tablet Take 1 tablet (1,000 mg total) by mouth 2 (two) times daily with a meal. 07/31/20   Hyman Hopes B, NP  sildenafil (REVATIO) 20 MG tablet TAKE 2 TO 5 TABLETS BY MOUTH ONCE DAILY AS NEEDED, APPT FOR FUTURE REFILLS Patient not taking: Reported on 07/31/2020 06/05/20   Agapito Games, MD  zolpidem (AMBIEN) 10 MG tablet TAKE 1 TABLET BY MOUTH EVERY DAY AT BEDTIME AS NEEDED FOR SLEEP Patient not taking: Reported on 07/31/2020 05/30/20  Agapito Games, MD    Allergies  Allergen Reactions  . Gabapentin Other (See Comments)    Drowsiness   . Sulfonamide Derivatives     REACTION: pt. can't remember reaction    Social History   Tobacco Use  . Smoking status: Light Tobacco Smoker    Packs/day: 0.50  . Smokeless tobacco: Current User  Substance Use Topics  . Alcohol use: Yes  . Drug use: No    Family History  Problem Relation Age of Onset  . Hyperlipidemia Mother   . Heart failure Mother   . Hypertension Father   . Prostate cancer Father   . Cancer Other        grandmother  . Diabetes Brother   . Heart attack Other        grandfather  . Heart attack Brother  46  . Heart failure Brother   . Heart disease Paternal Uncle     Review of Systems  Constitutional: negative for fevers and chills Gastrointestinal: negative for nausea and diarrhea Musculoskeletal: negative for myalgias and arthralgias All other systems reviewed and are negative    Constitutional: in no apparent distress There were no vitals filed for this visit. EYES: anicteric Cardiovascular: Cor RRR Respiratory: clear Musculoskeletal: no pedal edema noted Skin: negatives: no rash  Labs: Lab Results  Component Value Date   WBC 7.0 08/12/2019   HGB 11.9 (L) 08/12/2019   HCT 36.9 (L) 08/12/2019   MCV 90.4 08/12/2019   PLT 190 08/12/2019    Lab Results  Component Value Date   CREATININE 0.92 08/10/2019   BUN 13 08/10/2019   NA 137 08/10/2019   K 3.7 08/10/2019   CL 100 08/10/2019   CO2 27 08/10/2019    Lab Results  Component Value Date   ALT 34 08/10/2019   AST 33 08/10/2019   GGT 384 (H) 03/14/2019   ALKPHOS 80 08/10/2019   BILITOT 0.5 08/10/2019   INR 1.0 08/10/2019     Assessment: abscesses, ? Impetigo.  Organism typically Strep, though Staph also prevalent.  He may need I and D of one area.  I will add amoxicillin for him for 7 days.    Plan: 1) amoxicillin for Strep coverage plus doxy 7 days.   2) Surgery or wound care for I and D I discussed bleach baths once lesions are all healed up

## 2020-08-06 NOTE — Telephone Encounter (Signed)
[facility]

## 2020-08-09 NOTE — Progress Notes (Signed)
The culture of the wound did show MRSA. Finish the entire course of the doxycycline I sent you. Keep following with Infectious Disease. Have you been able to see Wound Care provider yet?

## 2020-08-28 ENCOUNTER — Other Ambulatory Visit: Payer: Self-pay | Admitting: Family Medicine

## 2020-08-28 DIAGNOSIS — N529 Male erectile dysfunction, unspecified: Secondary | ICD-10-CM

## 2020-08-28 DIAGNOSIS — I10 Essential (primary) hypertension: Secondary | ICD-10-CM

## 2020-08-28 MED ORDER — SILDENAFIL CITRATE 20 MG PO TABS: ORAL_TABLET | ORAL | 0 refills | Status: DC

## 2020-09-23 ENCOUNTER — Other Ambulatory Visit: Payer: Self-pay | Admitting: Family Medicine

## 2020-09-23 DIAGNOSIS — I10 Essential (primary) hypertension: Secondary | ICD-10-CM

## 2020-09-23 DIAGNOSIS — N529 Male erectile dysfunction, unspecified: Secondary | ICD-10-CM

## 2020-09-24 MED ORDER — SILDENAFIL CITRATE 20 MG PO TABS: ORAL_TABLET | ORAL | 0 refills | Status: DC

## 2020-10-02 ENCOUNTER — Other Ambulatory Visit: Payer: Self-pay

## 2020-10-02 MED ORDER — ZOLPIDEM TARTRATE 10 MG PO TABS: ORAL_TABLET | ORAL | 1 refills | Status: DC

## 2020-10-15 ENCOUNTER — Other Ambulatory Visit: Payer: Self-pay | Admitting: *Deleted

## 2020-10-15 DIAGNOSIS — N529 Male erectile dysfunction, unspecified: Secondary | ICD-10-CM

## 2020-10-15 DIAGNOSIS — I10 Essential (primary) hypertension: Secondary | ICD-10-CM

## 2020-10-15 MED ORDER — SILDENAFIL CITRATE 20 MG PO TABS: ORAL_TABLET | ORAL | 0 refills | Status: DC

## 2020-10-29 ENCOUNTER — Telehealth: Payer: Self-pay

## 2020-10-29 MED ORDER — ZOLPIDEM TARTRATE 10 MG PO TABS: ORAL_TABLET | ORAL | 0 refills | Status: DC

## 2020-10-29 NOTE — Telephone Encounter (Signed)
Pt called and LVM stating that last month his [facility] was [facility] but the refill that was sent in needs to go to [facility] in [place]. He states that the one written last [facility] was [facility] to [facility] wrong [facility] in the first place but he went ahead and [facility], but would like it transferred to [facility] in [place]. Rx has been tee'd up below and ready for review and approval/denial.

## 2020-10-29 NOTE — Telephone Encounter (Signed)
Meds ordered this encounter  Medications   zolpidem (AMBIEN) 10 MG tablet    Sig: TAKE 1 TABLET BY MOUTH EVERY DAY AT BEDTIME AS NEEDED FOR SLEEP    Dispense:  30 tablet    Refill:  0    Not to exceed 4 additional fills before 09/02/2020.

## 2020-12-08 ENCOUNTER — Other Ambulatory Visit: Payer: Self-pay | Admitting: Family Medicine

## 2020-12-09 NOTE — Telephone Encounter (Signed)
Pt was scheduled with Ladona Ridgel, he is only in town for a few days and needed refills. Told to schedule a follow up with [facility] per [facility] recommendations.

## 2020-12-09 NOTE — Telephone Encounter (Signed)
Please call pt and inform him that he is OVERDUE for f/u for bp/dm and insomnia.

## 2020-12-11 ENCOUNTER — Ambulatory Visit (INDEPENDENT_AMBULATORY_CARE_PROVIDER_SITE_OTHER): Payer: Self-pay | Admitting: Family Medicine

## 2020-12-11 ENCOUNTER — Encounter: Payer: Self-pay | Admitting: Family Medicine

## 2020-12-11 VITALS — BP 183/97 | HR 87 | Temp 98.0°F | Wt 148.1 lb

## 2020-12-11 DIAGNOSIS — E118 Type 2 diabetes mellitus with unspecified complications: Secondary | ICD-10-CM

## 2020-12-11 DIAGNOSIS — F419 Anxiety disorder, unspecified: Secondary | ICD-10-CM

## 2020-12-11 DIAGNOSIS — Z23 Encounter for immunization: Secondary | ICD-10-CM

## 2020-12-11 DIAGNOSIS — G4700 Insomnia, unspecified: Secondary | ICD-10-CM

## 2020-12-11 DIAGNOSIS — Z794 Long term (current) use of insulin: Secondary | ICD-10-CM

## 2020-12-11 DIAGNOSIS — I10 Essential (primary) hypertension: Secondary | ICD-10-CM

## 2020-12-11 LAB — POCT UA - MICROALBUMIN
Albumin/Creatinine Ratio, Urine, POC: >300
Creatinine, POC: 50 mg/dL
Microalbumin Ur, POC: 80 mg/L

## 2020-12-11 LAB — POCT GLYCOSYLATED HEMOGLOBIN (HGB A1C): HbA1c POC (<> result, manual entry): >14 % (ref 4.0–5.6)

## 2020-12-11 MED ORDER — TOUJEO SOLOSTAR 300 UNIT/ML ~~LOC~~ SOPN: PEN_INJECTOR | SUBCUTANEOUS | 0 refills | Status: AC

## 2020-12-11 MED ORDER — SITAGLIPTIN PHOSPHATE 50 MG PO TABS: 50.0000 mg | ORAL_TABLET | Freq: Every day | ORAL | 3 refills | Status: AC

## 2020-12-11 MED ORDER — FLUOXETINE HCL 40 MG PO CAPS: ORAL_CAPSULE | ORAL | 1 refills | Status: DC

## 2020-12-11 MED ORDER — HYDROCHLOROTHIAZIDE 25 MG PO TABS: 12.5000 mg | ORAL_TABLET | Freq: Every day | ORAL | 1 refills | Status: DC

## 2020-12-11 MED ORDER — ZOLPIDEM TARTRATE 10 MG PO TABS: ORAL_TABLET | ORAL | 0 refills | Status: DC

## 2020-12-11 MED ORDER — BD PEN NEEDLE SHORT U/F 31G X 8 MM MISC: 11 refills | Status: DC

## 2020-12-11 MED ORDER — ATORVASTATIN CALCIUM 40 MG PO TABS: 40.0000 mg | ORAL_TABLET | Freq: Every day | ORAL | 1 refills | Status: DC

## 2020-12-11 MED ORDER — LOSARTAN POTASSIUM 100 MG PO TABS: 100.0000 mg | ORAL_TABLET | Freq: Every day | ORAL | 1 refills | Status: DC

## 2020-12-11 MED ORDER — METFORMIN HCL 1000 MG PO TABS: 1000.0000 mg | ORAL_TABLET | Freq: Two times a day (BID) | ORAL | 3 refills | Status: DC

## 2020-12-11 NOTE — Patient Instructions (Signed)
Restart all diabetes and blood pressure meds Monitor your blood sugar and blood pressure at home Adding Januvia for blood sugars. Urine and blood work today - we will let you know results Follow-up in 2 weeks for blood pressure check with nurse Follow-up in 4 weeks with Dr. Linford Arnold - bring blood sugar log

## 2020-12-11 NOTE — Progress Notes (Signed)
Established Patient Office Visit  Subjective:  Patient ID: Larry Lawson, male    DOB: [birth year]  Age: 50 y.o. MRN: [id number]  CC:  Chief Complaint  Patient presents with   Hypertension   Diabetes   Insomnia     HPI Larry Lawson presents for routine follow-up   HYPERTENSION: - Medications: HCTZ 12.5 mg daily, losartan 100 mg daily, - Compliance: has been out of meds for the past month - Checking BP at home: not recently - Denies any SOB, recurrent headaches, CP, vision changes, LE edema, dizziness, palpitations, or medication side effects. - Diet: "whatever I can get" - Exercise: stays active - Stressors: broke up girlfriend last week, transitioning to new job, late on bills   DIABETES: - Checking BG at home: not in the past month - Medications: Toujeo 36 units nightly, metformin 1000 mg BID, lipitor 40 mg daily - wants to eventually switch to other regimen once he has insurance, cannot afford right now - Compliance: good until losing meds last month - Diet: "whatever I can get" - Exercise: stays active - Eye exam: needs to schedule - Reports polydipsia, polyuria - Denies symptoms of hypoglycemia, numbness extremities, foot ulcers/trauma, wounds that are not healing, medication side effects    INSOMNIA: - Medication: ambien 10 mg nightly PRN, needs refill     Past Medical History:  Diagnosis Date   Diabetes mellitus    Heart murmur    Heart murmur    Hypertension     Past Surgical History:  Procedure Laterality Date   I & D EXTREMITY Left 08/11/2019   Procedure: IRRIGATION AND DEBRIDEMENT LONG FINGER, AMPUTATION;  Surgeon: Betha Loa, MD;  Location: [facility] OR;  Service: Orthopedics;  Laterality: Left;    Family History  Problem Relation Age of Onset   Hyperlipidemia Mother    Heart failure Mother    Hypertension Father    Prostate cancer Father    Cancer Other        grandmother   Diabetes Brother    Heart attack Other        grandfather   Heart  attack Brother 61   Heart failure Brother    Heart disease Paternal Uncle     Social History   Socioeconomic History   Marital status: Married    Spouse name: Not on file   Number of children: Not on file   Years of education: Not on file   Highest education level: Not on file  Occupational History   Not on file  Tobacco Use   Smoking status: Light Smoker    Packs/day: 0.50    Types: Cigarettes   Smokeless tobacco: Current  Substance and Sexual Activity   Alcohol use: Yes   Drug use: No   Sexual activity: Not on file    Comment: [employer] @ [employer], Masters degree, married, no regular exercise.  Other Topics Concern   Not on file  Social History Narrative   Not on file   Social Determinants of Health   Financial Resource Strain: Not on file  Food Insecurity: Not on file  Transportation Needs: Not on file  Physical Activity: Not on file  Stress: Not on file  Social Connections: Not on file  Intimate Partner Violence: Not on file    Outpatient Medications Prior to Visit  Medication Sig Dispense Refill   amoxicillin (AMOXIL) 500 MG capsule Take 1 capsule (500 mg total) by mouth 3 (three) times daily. 21 capsule 0  sildenafil (REVATIO) 20 MG tablet TAKE 2 TO 5 TABLETS BY MOUTH ONCE DAILY AS NEEDED 50 tablet 0   FLUoxetine (PROZAC) 40 MG capsule TAKE 1 CAPSULE BY MOUTH EVERY DAY 90 capsule 1   hydrochlorothiazide (HYDRODIURIL) 25 MG tablet Take 0.5 tablets (12.5 mg total) by mouth daily. 30 tablet 1   insulin glargine, 1 Unit Dial, (TOUJEO SOLOSTAR) 300 UNIT/ML Solostar Pen INJECT 36 UNITS INTO THE SKIN AT BEDTIME. 1 mL 0   losartan (COZAAR) 100 MG tablet Take 1 tablet (100 mg total) by mouth daily. 90 tablet 1   metFORMIN (GLUCOPHAGE) 1000 MG tablet Take 1 tablet (1,000 mg total) by mouth 2 (two) times daily with a meal. 180 tablet 3   zolpidem (AMBIEN) 10 MG tablet TAKE 1 TABLET BY MOUTH EVERY DAY AT BEDTIME AS NEEDED FOR SLEEP 10 tablet 0   albuterol  (VENTOLIN HFA) 108 (90 Base) MCG/ACT inhaler INHALE 2 PUFFS INTO THE LUNGS EVERY 6 HOURS AS NEEDED FOR WHEEZING. (Patient not taking: No sig reported) 18 g 5   AMBULATORY NON FORMULARY MEDICATION Medication Name: Glucometer with strips and lancets to check daily.  Dx: Diabetes 250.00 (Patient not taking: No sig reported) 1 Units 3   clonazePAM (KLONOPIN) 1 MG tablet Take 1 mg by mouth 4 (four) times daily as needed. (Patient not taking: No sig reported)     doxepin (SINEQUAN) 25 MG capsule Take 25 mg by mouth at bedtime. (Patient not taking: No sig reported)     atorvastatin (LIPITOR) 40 MG tablet TAKE 1 TABLET BY MOUTH EVERY DAY (Patient not taking: No sig reported) 90 tablet 2   Insulin Pen Needle (B-D ULTRAFINE III SHORT PEN) 31G X 8 MM MISC USE  TO ADMINISTER INSULIN, once daily. Dx E11.8 (Patient not taking: No sig reported) 100 each 11   No facility-administered medications prior to visit.    Allergies  Allergen Reactions   Gabapentin Other (See Comments)    Drowsiness    Sulfonamide Derivatives     REACTION: pt. can't remember reaction    ROS Review of Systems All review of systems negative except what is listed in the HPI    Objective:    Physical Exam Vitals reviewed.  HENT:     Head: Normocephalic and atraumatic.     Right Ear: Tympanic membrane normal.     Left Ear: Tympanic membrane normal.  Cardiovascular:     Rate and Rhythm: Normal rate and regular rhythm.     Heart sounds: Normal heart sounds.  Pulmonary:     Effort: Pulmonary effort is normal.     Breath sounds: Normal breath sounds.  Musculoskeletal:     Cervical back: Normal range of motion and neck supple. No tenderness.  Lymphadenopathy:     Cervical: No cervical adenopathy.  Skin:    Comments: Refused foot exam  Neurological:     Mental Status: He is alert and oriented to person, place, and time.  Psychiatric:        Thought Content: Thought content normal.        Judgment: Judgment normal.     BP (!) 183/97 (BP Location: Left Arm, Patient Position: Sitting, Cuff Size: Normal)   Pulse 87   Temp 98 F (36.7 C) (Oral)   Wt 148 lb 1.3 oz (67.2 kg)   BMI 21.87 kg/m  Wt Readings from Last 3 Encounters:  12/11/20 148 lb 1.3 oz (67.2 kg)  07/31/20 157 lb 8 oz (71.4 kg)  12/13/19 188 lb (  85.3 kg)     Health Maintenance Due  Topic Date Due   COVID-19 Vaccine (1) Never done   OPHTHALMOLOGY EXAM  05/03/2013   FOOT EXAM  07/22/2020    There are no preventive care reminders to display for this patient.  Lab Results  Component Value Date   TSH 2.23 02/21/2019   Lab Results  Component Value Date   WBC 7.0 08/12/2019   HGB 11.9 (L) 08/12/2019   HCT 36.9 (L) 08/12/2019   MCV 90.4 08/12/2019   PLT 190 08/12/2019   Lab Results  Component Value Date   NA 137 08/10/2019   K 3.7 08/10/2019   CO2 27 08/10/2019   GLUCOSE 66 (L) 08/10/2019   BUN 13 08/10/2019   CREATININE 0.92 08/10/2019   BILITOT 0.5 08/10/2019   ALKPHOS 80 08/10/2019   AST 33 08/10/2019   ALT 34 08/10/2019   PROT 7.1 08/10/2019   ALBUMIN 4.0 08/10/2019   CALCIUM 9.2 08/10/2019   ANIONGAP 10 08/10/2019   Lab Results  Component Value Date   CHOL 127 02/21/2019   Lab Results  Component Value Date   HDL 40 02/21/2019   Lab Results  Component Value Date   LDLCALC 74 02/21/2019   Lab Results  Component Value Date   TRIG 54 02/21/2019   Lab Results  Component Value Date   CHOLHDL 3.2 02/21/2019   Lab Results  Component Value Date   HGBA1C >14.0 12/11/2020      Assessment & Plan:   1. Type 2 diabetes mellitus with complication (HCC) POCT A1c in office unreadable, >14% - checking with lab draw. Giving patient another sample of Toujeo to resume at 36 units nightly. Refilling metformin. Starting Januvia. Adding routine labs including blood work and urine microalbumin (remains elevated as it has in the past). Recommend he keep log of fasting CBG and bring to next appointment. Reinforced  significance of DM management, CBG monitoring, med compliance, diet, exercise, eye exams and foot exams.   - POCT HgB A1C - CBC - Comprehensive metabolic panel - Hemoglobin A1c - sitaGLIPtin (JANUVIA) 50 MG tablet; Take 1 tablet (50 mg total) by mouth daily.  Dispense: 90 tablet; Refill: 3 - insulin glargine, 1 Unit Dial, (TOUJEO SOLOSTAR) 300 UNIT/ML Solostar Pen; INJECT 36 UNITS INTO THE SKIN AT BEDTIME.  Dispense: 1 mL; Refill: 0 - metFORMIN (GLUCOPHAGE) 1000 MG tablet; Take 1 tablet (1,000 mg total) by mouth 2 (two) times daily with a meal.  Dispense: 180 tablet; Refill: 3 - atorvastatin (LIPITOR) 40 MG tablet; Take 1 tablet (40 mg total) by mouth daily.  Dispense: 90 tablet; Refill: 1 - Insulin Pen Needle (B-D ULTRAFINE III SHORT PEN) 31G X 8 MM MISC; USE  TO ADMINISTER INSULIN, once daily. Dx E11.8  Dispense: 100 each; Refill: 11 - POCT UA - Microalbumin  2. Need for shingles vaccine - Varicella-zoster vaccine IM (Shingrix)  3. Need for influenza vaccination - Flu Vaccine QUAD 6+ mos PF IM (Fluarix Quad PF)  4. HYPERTENSION, BENIGN ESSENTIAL Restarting previous meds. Recommend he monitor at home daily for the next 1-2 weeks and bring log to nurse visit in 2 weeks. If remaining significantly elevated or beginning to show symptoms, come in sooner. Discussed significance of medication compliance, diet, and exercise.   - CBC - Comprehensive metabolic panel - Lipid panel - hydrochlorothiazide (HYDRODIURIL) 25 MG tablet; Take 0.5 tablets (12.5 mg total) by mouth daily.  Dispense: 45 tablet; Refill: 1 - losartan (COZAAR) 100 MG tablet; Take  1 tablet (100 mg total) by mouth daily.  Dispense: 90 tablet; Refill: 1 - atorvastatin (LIPITOR) 40 MG tablet; Take 1 tablet (40 mg total) by mouth daily.  Dispense: 90 tablet; Refill: 1 - DRUG MONITORING, PANEL 6 WITH CONFIRMATION, URINE   5. Anxiety Continue current regimen. - FLUoxetine (PROZAC) 40 MG capsule; TAKE 1 CAPSULE BY MOUTH EVERY DAY   Dispense: 90 capsule; Refill: 1  6. Insomnia, unspecified type Only giving small refill while drug screen pending. PCP can renew additional refills if indicated. - zolpidem (AMBIEN) 10 MG tablet; TAKE 1 TABLET BY MOUTH EVERY DAY AT BEDTIME AS NEEDED FOR SLEEP  Dispense: 10 tablet; Refill: 0 - DRUG MONITORING, PANEL 6 WITH CONFIRMATION, URINE   Patient aware of signs/symptoms requiring further/urgent evaluation.   Follow-up: Return in about 2 weeks (around 12/25/2020) for BP check nurse visit in 2 weeks; PCP f/u in 4 weeks for DM/HTN.    Clayborne Dana, NP

## 2020-12-12 LAB — COMPREHENSIVE METABOLIC PANEL
AG Ratio: 1.6 (calc) (ref 1.0–2.5)
ALT: 28 U/L (ref 9–46)
AST: 26 U/L (ref 10–35)
Albumin: 4.5 g/dL (ref 3.6–5.1)
Alkaline phosphatase (APISO): 179 U/L — ABNORMAL HIGH (ref 35–144)
BUN: 10 mg/dL (ref 7–25)
CO2: 29 mmol/L (ref 20–32)
Calcium: 10.4 mg/dL — ABNORMAL HIGH (ref 8.6–10.3)
Chloride: 86 mmol/L — ABNORMAL LOW (ref 98–110)
Creat: 1.02 mg/dL (ref 0.70–1.30)
Globulin: 2.9 g/dL (calc) (ref 1.9–3.7)
Glucose, Bld: 786 mg/dL (ref 65–139)
Potassium: 5.7 mmol/L — ABNORMAL HIGH (ref 3.5–5.3)
Sodium: 127 mmol/L — ABNORMAL LOW (ref 135–146)
Total Bilirubin: 0.3 mg/dL (ref 0.2–1.2)
Total Protein: 7.4 g/dL (ref 6.1–8.1)

## 2020-12-12 LAB — LIPID PANEL
Cholesterol: 213 mg/dL — ABNORMAL HIGH (ref <200)
HDL: 57 mg/dL (ref >=40)
LDL Cholesterol (Calc): 122 mg/dL (calc) — ABNORMAL HIGH
Non-HDL Cholesterol (Calc): 156 mg/dL (calc) — ABNORMAL HIGH (ref <130)
Total CHOL/HDL Ratio: 3.7 (calc) (ref <5.0)
Triglycerides: 226 mg/dL — ABNORMAL HIGH (ref <150)

## 2020-12-12 LAB — CBC
HCT: 51.4 % — ABNORMAL HIGH (ref 38.5–50.0)
Hemoglobin: 16.2 g/dL (ref 13.2–17.1)
MCH: 28.2 pg (ref 27.0–33.0)
MCHC: 31.5 g/dL — ABNORMAL LOW (ref 32.0–36.0)
MCV: 89.5 fL (ref 80.0–100.0)
MPV: 10.9 fL (ref 7.5–12.5)
Platelets: 306 10*3/uL (ref 140–400)
RBC: 5.74 10*6/uL (ref 4.20–5.80)
RDW: 12.1 % (ref 11.0–15.0)
WBC: 9.5 10*3/uL (ref 3.8–10.8)

## 2020-12-12 LAB — HEMOGLOBIN A1C: Hgb A1c MFr Bld: >14 % of total Hgb — ABNORMAL HIGH (ref <5.7)

## 2020-12-14 LAB — DRUG MONITORING, PANEL 6 WITH CONFIRMATION, URINE
6 Acetylmorphine: NEGATIVE ng/mL (ref <10)
Alcohol Metabolites: NEGATIVE ng/mL (ref <500)
Amphetamine: 295 ng/mL — ABNORMAL HIGH (ref <250)
Amphetamines: POSITIVE ng/mL — AB (ref <500)
Barbiturates: NEGATIVE ng/mL (ref <300)
Benzodiazepines: NEGATIVE ng/mL (ref <100)
Cocaine Metabolite: NEGATIVE ng/mL (ref <150)
Creatinine: 9.9 mg/dL — ABNORMAL LOW (ref >=20.0)
Marijuana Metabolite: NEGATIVE ng/mL (ref <20)
Methadone Metabolite: NEGATIVE ng/mL (ref <100)
Methamphetamine: 2009 ng/mL — ABNORMAL HIGH (ref <250)
Opiates: NEGATIVE ng/mL (ref <100)
Oxidant: NEGATIVE ug/mL (ref <200)
Oxycodone: NEGATIVE ng/mL (ref <100)
Phencyclidine: NEGATIVE ng/mL (ref <25)
Specific Gravity: 1.026 (ref >=1.003)
pH: 7.1 (ref 4.5–9.0)

## 2020-12-14 LAB — DM TEMPLATE

## 2020-12-15 ENCOUNTER — Other Ambulatory Visit: Payer: Self-pay | Admitting: Family Medicine

## 2020-12-15 NOTE — Progress Notes (Signed)
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 If you need assistance stopping these substances, please let us know and we can offer you some resources to help. At this time our office will not be able to prescribe any more controlled substances for you. Also, it was recommended he go to the ED last week because of his lab results - please ensure that he does this and then schedule hospital follow-up with PCP as appropriate.

## 2020-12-25 ENCOUNTER — Ambulatory Visit: Payer: Self-pay

## 2020-12-26 ENCOUNTER — Telehealth: Payer: Self-pay | Admitting: General Practice

## 2020-12-26 NOTE — Telephone Encounter (Signed)
Transition Care Management Unsuccessful Follow-up Telephone Call  Date of discharge and from where:  12/25/20 from [facility]  Attempts:  1st Attempt  Reason for unsuccessful TCM follow-up call:  Left voice message

## 2020-12-29 NOTE — Telephone Encounter (Signed)
Transition Care Management Unsuccessful Follow-up Telephone Call  Date of discharge and from where:  12/25/20 from [facility]  Attempts:  2nd Attempt  Reason for unsuccessful TCM follow-up call:  Left voice message

## 2020-12-31 NOTE — Telephone Encounter (Signed)
Transition Care Management Unsuccessful Follow-up Telephone Call  Date of discharge and from where:  12/25/20 from [facility]  Attempts:  3rd Attempt  Reason for unsuccessful TCM follow-up call:  Left voice message

## 2021-01-08 ENCOUNTER — Ambulatory Visit: Payer: Self-pay | Admitting: Family Medicine

## 2021-03-12 DIAGNOSIS — R0609 Other forms of dyspnea: Secondary | ICD-10-CM

## 2021-04-13 ENCOUNTER — Telehealth: Payer: Self-pay | Admitting: General Practice

## 2021-04-13 NOTE — Telephone Encounter (Signed)
Transition Care Management Unsuccessful Follow-up Telephone Call  Date of discharge and from where:  04/10/21 from [facility]  Attempts:  1st Attempt  Reason for unsuccessful TCM follow-up call:  Unable to reach patient

## 2021-04-15 NOTE — Telephone Encounter (Signed)
Transition Care Management Unsuccessful Follow-up Telephone Call  Date of discharge and from where:  04/10/21 from [facility]  Attempts:  2nd Attempt  Reason for unsuccessful TCM follow-up call:  Unable to reach patient

## 2021-04-17 NOTE — Telephone Encounter (Signed)
Transition Care Management Unsuccessful Follow-up Telephone Call  Date of discharge and from where:  04/10/21 from [facility]  Attempts:  3rd Attempt  Reason for unsuccessful TCM follow-up call:  Unable to reach patient

## 2021-04-24 ENCOUNTER — Other Ambulatory Visit: Payer: Self-pay

## 2021-04-24 ENCOUNTER — Ambulatory Visit (INDEPENDENT_AMBULATORY_CARE_PROVIDER_SITE_OTHER): Payer: 59 | Admitting: Family Medicine

## 2021-04-24 ENCOUNTER — Encounter: Payer: Self-pay | Admitting: Family Medicine

## 2021-04-24 VITALS — BP 189/103 | HR 89 | Resp 18 | Ht 69.0 in | Wt 166.0 lb

## 2021-04-24 DIAGNOSIS — F1911 Other psychoactive substance abuse, in remission: Secondary | ICD-10-CM | POA: Insufficient documentation

## 2021-04-24 DIAGNOSIS — N529 Male erectile dysfunction, unspecified: Secondary | ICD-10-CM

## 2021-04-24 DIAGNOSIS — F419 Anxiety disorder, unspecified: Secondary | ICD-10-CM

## 2021-04-24 DIAGNOSIS — L0291 Cutaneous abscess, unspecified: Secondary | ICD-10-CM

## 2021-04-24 DIAGNOSIS — F5101 Primary insomnia: Secondary | ICD-10-CM

## 2021-04-24 DIAGNOSIS — E118 Type 2 diabetes mellitus with unspecified complications: Secondary | ICD-10-CM | POA: Diagnosis not present

## 2021-04-24 DIAGNOSIS — I10 Essential (primary) hypertension: Secondary | ICD-10-CM

## 2021-04-24 DIAGNOSIS — G4700 Insomnia, unspecified: Secondary | ICD-10-CM | POA: Insufficient documentation

## 2021-04-24 LAB — POCT GLYCOSYLATED HEMOGLOBIN (HGB A1C): HbA1c, POC (controlled diabetic range): 14 % — AB (ref 0.0–7.0)

## 2021-04-24 MED ORDER — DOXYCYCLINE HYCLATE 100 MG PO TABS: 100.0000 mg | ORAL_TABLET | Freq: Two times a day (BID) | ORAL | 0 refills | Status: DC

## 2021-04-24 MED ORDER — LOSARTAN POTASSIUM 100 MG PO TABS: 100.0000 mg | ORAL_TABLET | Freq: Every day | ORAL | 1 refills | Status: AC

## 2021-04-24 MED ORDER — FLUOXETINE HCL 40 MG PO CAPS: ORAL_CAPSULE | ORAL | 3 refills | Status: DC

## 2021-04-24 MED ORDER — BD PEN NEEDLE SHORT U/F 31G X 8 MM MISC: 11 refills | Status: AC

## 2021-04-24 MED ORDER — ATORVASTATIN CALCIUM 40 MG PO TABS: 40.0000 mg | ORAL_TABLET | Freq: Every day | ORAL | 3 refills | Status: AC

## 2021-04-24 MED ORDER — METFORMIN HCL ER 500 MG PO TB24: 1000.0000 mg | ORAL_TABLET | Freq: Every day | ORAL | 1 refills | Status: AC

## 2021-04-24 MED ORDER — SILDENAFIL CITRATE 20 MG PO TABS: ORAL_TABLET | ORAL | 0 refills | Status: DC

## 2021-04-24 NOTE — Assessment & Plan Note (Signed)
New prescription sent for his fluoxetine 40 mg daily.

## 2021-04-24 NOTE — Assessment & Plan Note (Signed)
Has been clean for about 6 weeks at this point.  Currently living in a halfway house.  Just encouraged him to continue to make those good choices and work at staying off of meth.

## 2021-04-24 NOTE — Progress Notes (Signed)
Established Patient Office Visit  Subjective:  Patient ID: Larry Lawson, male    DOB: 05/06/1970  Age: 51 y.o. MRN: [id number]  CC:  Chief Complaint  Patient presents with   Diabetes    Follow up    Lip Concern    Patient stated he was seen at [facility] 1 week ago for abscess on upper lip. Patient stated they drained the abscess and he was given Doxycycline. Patient states he feels like he need more antibiotics to help with the swelling.     Discuss medications    Discuss refilling Ambien.    Numbness    Patient would like to restart Gabapentin     HPI Larry Lawson presents for   Patient stated he was seen at [facility] 1 week ago for abscess on upper lip.  He says that they lanced it in the emergency department.  Patient stated they drained the abscess and he was given Doxycycline. Patient states he feels like he need more antibiotics to help with the swelling.    He was also seen at [facility] in [facility] back in October.  Because he felt like he might be dehydrated.  Glucose was in the 700s during the ED visit.  Diabetes -says he has tried to get back on track with doing a better job with giving his insulin and eating better since he was in the hospital.  Follow-up insomnia-he would like a refill on his Ambien.  He would also like to restart gabapentin for his numbness and neuropathy.  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  He was attending daily NA sessions for 30 days and now goes 3 days/week.  They also do intermittent drug testing.  Past Medical History:  Diagnosis Date   Diabetes mellitus    Heart murmur    Heart murmur    Hypertension     Past Surgical History:  Procedure Laterality Date   I & D EXTREMITY Left 08/11/2019   Procedure: IRRIGATION AND DEBRIDEMENT LONG FINGER, AMPUTATION;  Surgeon: Betha Loa, MD;  Location: [facility] OR;  Service:  Orthopedics;  Laterality: Left;    Family History  Problem Relation Age of Onset   Hyperlipidemia Mother    Heart failure Mother    Hypertension Father    Prostate cancer Father    Cancer Other        grandmother   Diabetes Brother    Heart attack Other        grandfather   Heart attack Brother 93   Heart failure Brother    Heart disease Paternal Uncle     Social History   Socioeconomic History   Marital status: Divorced    Spouse name: Not on file   Number of children: Not on file   Years of education: Not on file   Highest education level: Not on file  Occupational History   Not on file  Tobacco Use   Smoking status: Light Smoker    Packs/day: 0.50    Types: Cigarettes   Smokeless tobacco: Current  Substance and Sexual Activity   Alcohol use: Yes   Drug use: No   Sexual activity: Not on file    Comment: [employer] @ [employer], Masters degree, married, no regular exercise.  Other Topics Concern   Not on file  Social History Narrative   Not on file   Social  Determinants of Health   Financial Resource Strain: Not on file  Food Insecurity: Not on file  Transportation Needs: Not on file  Physical Activity: Not on file  Stress: Not on file  Social Connections: Not on file  Intimate Partner Violence: Not on file    Outpatient Medications Prior to Visit  Medication Sig Dispense Refill   insulin glargine, 1 Unit Dial, (TOUJEO SOLOSTAR) 300 UNIT/ML Solostar Pen INJECT 36 UNITS INTO THE SKIN AT BEDTIME. 1 mL 0   sitaGLIPtin (JANUVIA) 50 MG tablet Take 1 tablet (50 mg total) by mouth daily. 90 tablet 3   atorvastatin (LIPITOR) 40 MG tablet Take 1 tablet (40 mg total) by mouth daily. 90 tablet 1   FLUoxetine (PROZAC) 40 MG capsule TAKE 1 CAPSULE BY MOUTH EVERY DAY 90 capsule 1   hydrochlorothiazide (HYDRODIURIL) 25 MG tablet Take 0.5 tablets (12.5 mg total) by mouth daily. 45 tablet 1   Insulin Pen Needle (B-D ULTRAFINE III SHORT PEN) 31G X 8 MM MISC USE   TO ADMINISTER INSULIN, once daily. Dx E11.8 100 each 11   losartan (COZAAR) 100 MG tablet Take 1 tablet (100 mg total) by mouth daily. 90 tablet 1   metFORMIN (GLUCOPHAGE) 1000 MG tablet Take 1 tablet (1,000 mg total) by mouth 2 (two) times daily with a meal. 180 tablet 3   sildenafil (REVATIO) 20 MG tablet TAKE 2 TO 5 TABLETS BY MOUTH ONCE DAILY AS NEEDED 50 tablet 0   albuterol (VENTOLIN HFA) 108 (90 Base) MCG/ACT inhaler INHALE 2 PUFFS INTO THE LUNGS EVERY 6 HOURS AS NEEDED FOR WHEEZING. (Patient not taking: No sig reported) 18 g 5   AMBULATORY NON FORMULARY MEDICATION Medication Name: Glucometer with strips and lancets to check daily.  Dx: Diabetes 250.00 (Patient not taking: No sig reported) 1 Units 3   amoxicillin (AMOXIL) 500 MG capsule Take 1 capsule (500 mg total) by mouth 3 (three) times daily. 21 capsule 0   doxepin (SINEQUAN) 25 MG capsule Take 25 mg by mouth at bedtime. (Patient not taking: No sig reported)     No facility-administered medications prior to visit.    Allergies  Allergen Reactions   Gabapentin Other (See Comments)    Drowsiness    Sulfonamide Derivatives     REACTION: pt. can't remember reaction    ROS Review of Systems    Objective:    Physical Exam Constitutional:      Appearance: Normal appearance. He is well-developed.  HENT:     Head: Normocephalic and atraumatic.     Nose:     Comments: Still has a slightly swollen area underneath the nose.  No active drainage just a little dry crusting on the surface. Cardiovascular:     Rate and Rhythm: Normal rate and regular rhythm.     Heart sounds: Normal heart sounds.  Pulmonary:     Effort: Pulmonary effort is normal.     Breath sounds: Normal breath sounds.  Skin:    General: Skin is warm and dry.  Neurological:     Mental Status: He is alert and oriented to person, place, and time. Mental status is at baseline.  Psychiatric:        Behavior: Behavior normal.    BP (!) 189/103 (BP Location:  Left Arm)    Pulse 89    Resp 18    Ht 5\' 9"  (1.753 m)    Wt 166 lb (75.3 kg)    SpO2 99%    BMI 24.51 kg/m  Wt Readings from Last 3 Encounters:  04/24/21 166 lb (75.3 kg)  12/11/20 148 lb 1.3 oz (67.2 kg)  07/31/20 157 lb 8 oz (71.4 kg)     Health Maintenance Due  Topic Date Due   COVID-19 Vaccine (1) Never done   Pneumococcal Vaccine 68-64 Years old (2 - PCV) 08/24/2011   OPHTHALMOLOGY EXAM  05/03/2013   Zoster Vaccines- Shingrix (2 of 2) 02/05/2021    There are no preventive care reminders to display for this patient.  Lab Results  Component Value Date   TSH 2.23 02/21/2019   Lab Results  Component Value Date   WBC 9.5 12/11/2020   HGB 16.2 12/11/2020   HCT 51.4 (H) 12/11/2020   MCV 89.5 12/11/2020   PLT 306 12/11/2020   Lab Results  Component Value Date   NA 127 (L) 12/11/2020   K 5.7 (H) 12/11/2020   CO2 29 12/11/2020   GLUCOSE 786 (HH) 12/11/2020   BUN 10 12/11/2020   CREATININE 1.02 12/11/2020   BILITOT 0.3 12/11/2020   ALKPHOS 80 08/10/2019   AST 26 12/11/2020   ALT 28 12/11/2020   PROT 7.4 12/11/2020   ALBUMIN 4.0 08/10/2019   CALCIUM 10.4 (H) 12/11/2020   ANIONGAP 10 08/10/2019   Lab Results  Component Value Date   CHOL 213 (H) 12/11/2020   Lab Results  Component Value Date   HDL 57 12/11/2020   Lab Results  Component Value Date   LDLCALC 122 (H) 12/11/2020   Lab Results  Component Value Date   TRIG 226 (H) 12/11/2020   Lab Results  Component Value Date   CHOLHDL 3.7 12/11/2020   Lab Results  Component Value Date   HGBA1C  04/24/2021     Comment:     Greater than 14    HGBA1C 14.0 (A) 04/24/2021      Assessment & Plan:   Problem List Items Addressed This Visit       Cardiovascular and Mediastinum   HYPERTENSION, BENIGN ESSENTIAL    Controlled today.  We will get back on his blood pressure medication.  New prescription sent to pharmacy for the losartan I am holding off on the HCTZ as he recently had an episode of dehydration  and with his blood sugars being so elevated I do not want to cause DKA so work on hopefully add that back once his sugars are coming down.      Relevant Medications   sildenafil (REVATIO) 20 MG tablet   atorvastatin (LIPITOR) 40 MG tablet   losartan (COZAAR) 100 MG tablet     Endocrine   Type 2 diabetes mellitus with complication (HCC) - Primary    A1c greater than 14 today based on results.  He does have insulin now and says he is giving himself 40 units.  We will go ahead and restart the metformin.  He would really like to get back on the Januvia he felt like he always did really well on that medication but we will have to see if insurance will cover it.  Continue to work on better healthy food choices.      Relevant Medications   Insulin Pen Needle (B-D ULTRAFINE III SHORT PEN) 31G X 8 MM MISC   atorvastatin (LIPITOR) 40 MG tablet   losartan (COZAAR) 100 MG tablet   metFORMIN (GLUCOPHAGE-XR) 500 MG 24 hr tablet   Other Relevant Orders   POCT HgB A1C (Completed)     Other   Insomnia    Previously  on Ambien but after coming back positive for methamphetamines we have discontinued any scheduled drugs.  At some point if he continues to be clean we could consider restarting it but not at this point.  We did repeat a UDS today.      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  Currently living in a halfway house.  Just encouraged him to continue to make those good choices and work at staying off of meth.      Erectile dysfunction   Relevant Medications   sildenafil (REVATIO) 20 MG tablet   Anxiety    New prescription sent for his fluoxetine 40 mg daily.      Relevant Medications   FLUoxetine (PROZAC) 40 MG capsule   Other Relevant Orders   DRUG MONITORING, PANEL 6 WITH CONFIRMATION, URINE   Abscess    Abscess on upper lip just below the nose.  It actually does look like it is in the healing phase but we discussed doing an extra 7  days of doxycycline to make sure that it clears up.  Meds ordered this encounter  Medications   sildenafil (REVATIO) 20 MG tablet    Sig: TAKE 2 TO 5 TABLETS BY MOUTH ONCE DAILY AS NEEDED    Dispense:  50 tablet    Refill:  0   Insulin Pen Needle (B-D ULTRAFINE III SHORT PEN) 31G X 8 MM MISC    Sig: USE  TO ADMINISTER INSULIN, once daily. Dx E11.8    Dispense:  100 each    Refill:  11   FLUoxetine (PROZAC) 40 MG capsule    Sig: TAKE 1 CAPSULE BY MOUTH EVERY DAY    Dispense:  90 capsule    Refill:  3   atorvastatin (LIPITOR) 40 MG tablet    Sig: Take 1 tablet (40 mg total) by mouth daily.    Dispense:  90 tablet    Refill:  3   doxycycline (VIBRA-TABS) 100 MG tablet    Sig: Take 1 tablet (100 mg total) by mouth 2 (two) times daily.    Dispense:  14 tablet    Refill:  0   losartan (COZAAR) 100 MG tablet    Sig: Take 1 tablet (100 mg total) by mouth daily.    Dispense:  90 tablet    Refill:  1   metFORMIN (GLUCOPHAGE-XR) 500 MG 24 hr tablet    Sig: Take 2 tablets (1,000 mg total) by mouth daily with breakfast.    Dispense:  180 tablet    Refill:  1    Follow-up: Return in about 3 months (around 07/22/2021) for Diabetes follow-up.    Nani Gasser, MD

## 2021-04-24 NOTE — Assessment & Plan Note (Signed)
Controlled today.  We will get back on his blood pressure medication.  New prescription sent to pharmacy for the losartan I am holding off on the HCTZ as he recently had an episode of dehydration and with his blood sugars being so elevated I do not want to cause DKA so work on hopefully add that back once his sugars are coming down.

## 2021-04-24 NOTE — Assessment & Plan Note (Signed)
Previously on Ambien but after coming back positive for methamphetamines we have discontinued any scheduled drugs.  At some point if he continues to be clean we could consider restarting it but not at this point.  We did repeat a UDS today.

## 2021-04-24 NOTE — Assessment & Plan Note (Signed)
A1c greater than 14 today based on results.  He does have insulin now and says he is giving himself 40 units.  We will go ahead and restart the metformin.  He would really like to get back on the Januvia he felt like he always did really well on that medication but we will have to see if insurance will cover it.  Continue to work on better healthy food choices.

## 2021-04-25 LAB — DRUG MONITORING, PANEL 6 WITH CONFIRMATION, URINE
6 Acetylmorphine: NEGATIVE ng/mL (ref <10)
Alcohol Metabolites: NEGATIVE ng/mL (ref <500)
Amphetamines: NEGATIVE ng/mL (ref <500)
Barbiturates: NEGATIVE ng/mL (ref <300)
Benzodiazepines: NEGATIVE ng/mL (ref <100)
Cocaine Metabolite: NEGATIVE ng/mL (ref <150)
Creatinine: 31.4 mg/dL (ref >=20.0)
Marijuana Metabolite: NEGATIVE ng/mL (ref <20)
Methadone Metabolite: NEGATIVE ng/mL (ref <100)
Opiates: NEGATIVE ng/mL (ref <100)
Oxidant: NEGATIVE ug/mL (ref <200)
Oxycodone: NEGATIVE ng/mL (ref <100)
Phencyclidine: NEGATIVE ng/mL (ref <25)
pH: 7.2 (ref 4.5–9.0)

## 2021-04-25 LAB — DM TEMPLATE

## 2021-05-05 ENCOUNTER — Telehealth: Payer: Self-pay

## 2021-05-05 NOTE — Telephone Encounter (Signed)
Initiated Prior authorization [id number] Citrate 20MG  tablets Via: Covermymeds Case/Key: [id number] Status: Pending as of 05/05/21 Reason: Notified Pt via: Mychart

## 2021-05-08 ENCOUNTER — Ambulatory Visit (INDEPENDENT_AMBULATORY_CARE_PROVIDER_SITE_OTHER): Payer: 59 | Admitting: Family Medicine

## 2021-05-08 ENCOUNTER — Other Ambulatory Visit: Payer: Self-pay

## 2021-05-08 VITALS — BP 159/85 | HR 74 | Ht 69.0 in | Wt 172.0 lb

## 2021-05-08 DIAGNOSIS — I10 Essential (primary) hypertension: Secondary | ICD-10-CM

## 2021-05-08 MED ORDER — HYDROCHLOROTHIAZIDE 25 MG PO TABS: 25.0000 mg | ORAL_TABLET | Freq: Every day | ORAL | 3 refills | Status: AC

## 2021-05-08 NOTE — Progress Notes (Signed)
Hypertension-agree with below start hydrochlorothiazide 25 mg.  If he does well then we can combine this with his losartan 100 mg daily.  Follow-up in 2 weeks for nurse visit and we will check BMP at that time. ?

## 2021-05-08 NOTE — Progress Notes (Signed)
Patient is here for blood pressure check.  ? ?Previous BP was 189/103 ? ?1st BP today: 200/107  74bpm ? ?2nd BP today (after 10  minutes): 159/85  774bpm ?Pt laid down following initial BP check. ? ?Denies chest pain, dizziness, shortness of breath, severe headache, or nosebleeds. ? ?Taking medication as prescribed. Missed 1 day of BP medication.  ? ?Per Dr. Linford Arnold: advised pt of 2 week follow-up Nurse visit with labs. Starting 25mg  of HCTZ. Pt advised of side effects.  ?

## 2021-05-12 ENCOUNTER — Other Ambulatory Visit: Payer: Self-pay

## 2021-05-12 DIAGNOSIS — I10 Essential (primary) hypertension: Secondary | ICD-10-CM

## 2021-05-12 DIAGNOSIS — N529 Male erectile dysfunction, unspecified: Secondary | ICD-10-CM

## 2021-05-12 MED ORDER — SILDENAFIL CITRATE 20 MG PO TABS: ORAL_TABLET | ORAL | 0 refills | Status: AC

## 2021-05-22 ENCOUNTER — Ambulatory Visit (INDEPENDENT_AMBULATORY_CARE_PROVIDER_SITE_OTHER): Payer: 59 | Admitting: Family Medicine

## 2021-05-22 ENCOUNTER — Other Ambulatory Visit: Payer: Self-pay

## 2021-05-22 VITALS — BP 128/77 | HR 80

## 2021-05-22 DIAGNOSIS — I10 Essential (primary) hypertension: Secondary | ICD-10-CM

## 2021-05-22 NOTE — Progress Notes (Signed)
Pt here for nurse BP check.  Pt denies CP, SOB, headaches, dizziness but does state that he has missed a couple of doses of medications.  Tiajuana Amass, CMA ? ?

## 2021-05-22 NOTE — Progress Notes (Signed)
HTN -looks great today and is at goal.  Just continue to work on staying consistent with medication dosing. ?

## 2021-05-22 NOTE — Progress Notes (Signed)
Pt informed.  Pt expressed understanding and is agreeable.  T. Tryphena Perkovich, CMA  

## 2021-05-28 ENCOUNTER — Encounter: Payer: 59 | Admitting: Family Medicine

## 2021-07-23 ENCOUNTER — Ambulatory Visit: Payer: 59 | Admitting: Family Medicine

## 2021-07-23 NOTE — Progress Notes (Deleted)
   Established Patient Office Visit  Subjective   Patient ID: TRUMAN ACEITUNO, male    DOB: 04-25-1970  Age: 51 y.o. MRN: [id number]  No chief complaint on file.   HPI  Diabetes - no hypoglycemic events. No wounds or sores that are not healing well. No increased thirst or urination. Checking glucose at home. Taking medications as prescribed without any side effects.   Hypertension- Pt denies chest pain, SOB, dizziness, or heart palpitations.  Taking meds as directed w/o problems.  Denies medication side effects.     {History (Optional):23778}  ROS    Objective:     There were no vitals taken for this visit. {Vitals History (Optional):23777}  Physical Exam   No results found for any visits on 07/23/21.  {Labs (Optional):23779}  The 10-year ASCVD risk score (Arnett DK, et al., 2019) is: 17.8%    Assessment & Plan:   Problem List Items Addressed This Visit       Cardiovascular and Mediastinum   HYPERTENSION, BENIGN ESSENTIAL - Primary     Endocrine   Type 2 diabetes mellitus with complication (HCC)    No follow-ups on file.    Nani Gasser, MD

## 2021-09-15 ENCOUNTER — Ambulatory Visit
Admit: 2021-09-15 | Discharge: 2021-09-15 | Payer: BLUE CROSS/BLUE SHIELD | Attending: Family | Primary: Diagnostic Radiology

## 2021-09-15 DIAGNOSIS — E118 Type 2 diabetes mellitus with unspecified complications: Secondary | ICD-10-CM

## 2021-09-15 LAB — AMB POC HEMOGLOBIN A1C: Hemoglobin A1C, POC: 11.6 %

## 2021-09-15 MED ORDER — METFORMIN HCL ER 500 MG PO TB24
500 MG | ORAL_TABLET | Freq: Two times a day (BID) | ORAL | 1 refills | Status: AC
Start: 2021-09-15 — End: 2022-04-13

## 2021-09-15 MED ORDER — LANTUS SOLOSTAR 100 UNIT/ML SC SOPN
100 UNIT/ML | Freq: Every evening | SUBCUTANEOUS | 1 refills | Status: DC
Start: 2021-09-15 — End: 2021-10-12

## 2021-09-15 NOTE — Progress Notes (Addendum)
Harry Mitchell (DOB:  03-02-1971) is a 51 y.o. male,Established patient, here for evaluation of the following chief complaint(s):  Other (Needs refills on medication scheduled to est care with dr. Demetrius Charity as well)      ASSESSMENT/PLAN:      ICD-10-CM    1. Type 2 diabetes mellitus with complication (HCC)  E11.8 POC Glycated Hemoglobin (HBA1C) Test (40102)     metFORMIN (GLUCOPHAGE-XR) 500 MG extended release tablet     Hemoglobin A1C     insulin glargine (LANTUS SOLOSTAR) 100 UNIT/ML injection pen      2. Essential hypertension, benign  I10 CBC     Comprehensive Metabolic Panel      3. Mixed hyperlipidemia  E78.2 Lipid Panel      4. History of drug abuse in remission (HCC)  F19.11 Urine Drug Screen      5. Anxiety  F41.9          DM2- A1c with mild improvement to 11.6 %.  Patient should increase metformin to 1000 mg twice daily with meals and take Lantus 45 units with dinner.  Given change in lifestyle and intense job, do not want to make too many changes to medications at this time.  Reviewed diet in detail and the need to limit sugar.  He needs to increase hydration and one electrolyte replacement daily.  Medications refilled.    HTN- Controlled in office.  Continue with current medications.  Will recheck kidney function.    HLD- Continue with current medication.  Due for recheck of lipid levels.    Anxiety- Continue with Prozac as prescribed.  Patient is looking for a counselor locally to help with substance abuse history and history of anxiety and depression.    Return for already scheduled w/ Dr Carlynn Purl.  Needs fasting lab week prior.    Spent about 40 minutes reviewing records, discussing chronic conditions/symptoms, examining/evaluating patient, providing counseling/recommendations, placing orders/ medication and completing documentation day of service.     SUBJECTIVE/OBJECTIVE:  HPI    Harry Mitchell is a 51 y.o. male  that presents to establish care with new primary care office, just moved to [facility] from  [place].    HTN- Currently taking HCTZ and Losartan for BP control.  Denies any s/e of use.  BP controlled in the office today.  Does not check at home.  No c/o chest pain, SOB, or HA.    HLD- Currently taking 40 mg atorvastatin.  Last lipid check was 11/22.  No medication s/e.    Anxiety/depression- Currently taking 40 Prozac.  Patient reports he has been on same medication for a few years.  He inadvertently stopped medication and realized how much it did help with depression symptoms.      DM2- Last a1c check was 14 %.  He is currently taking Metformin 1000 mg Q AM and 45 units of lantus in the morning.  Patient reports that he was hospitalized last year for blood sugars in 700s.      Patient with history of substance abuse.  Has been clean for a few months and moved to [facility] to get his life back on track.  He is currently working hard in a greenhouse, labor intensive work.      Current Outpatient Medications   Medication Sig Dispense Refill    hydroCHLOROthiazide (HYDRODIURIL) 25 MG tablet Take 1 tablet by mouth daily      FLUoxetine (PROZAC) 40 MG capsule Take 1 capsule by mouth daily  atorvastatin (LIPITOR) 40 MG tablet Take 1 tablet by mouth daily      losartan (COZAAR) 100 MG tablet Take 1 tablet by mouth daily      sildenafil (REVATIO) 20 MG tablet TAKE 2 TO 5 TABLETS BY MOUTH ONCE DAILY AS NEEDED      SEMGLEE, YFGN, 100 UNIT/ML SOPN Inject 45 units under skin daily at 8 am      metFORMIN (GLUCOPHAGE-XR) 500 MG extended release tablet Take 2 tablets by mouth with breakfast and with evening meal 360 tablet 1    insulin glargine (LANTUS SOLOSTAR) 100 UNIT/ML injection pen Inject 45 Units into the skin nightly 5 Adjustable Dose Pre-filled Pen Syringe 1     No current facility-administered medications for this visit.        Prior to Admission medications    Medication Sig Start Date End Date Taking? Authorizing Provider   hydroCHLOROthiazide (HYDRODIURIL) 25 MG tablet Take 1 tablet by mouth daily 08/01/21  Yes  Historical Provider, MD   FLUoxetine (PROZAC) 40 MG capsule Take 1 capsule by mouth daily 08/01/21  Yes Historical Provider, MD   atorvastatin (LIPITOR) 40 MG tablet Take 1 tablet by mouth daily 08/01/21  Yes Historical Provider, MD   losartan (COZAAR) 100 MG tablet Take 1 tablet by mouth daily 09/05/21  Yes Historical Provider, MD   sildenafil (REVATIO) 20 MG tablet TAKE 2 TO 5 TABLETS BY MOUTH ONCE DAILY AS NEEDED 05/12/21  Yes Historical Provider, MD   SEMGLEE, YFGN, 100 UNIT/ML SOPN Inject 45 units under skin daily at 8 am 09/05/21  Yes Historical Provider, MD   metFORMIN (GLUCOPHAGE-XR) 500 MG extended release tablet Take 2 tablets by mouth with breakfast and with evening meal 09/15/21  Yes Artice Bergerson A Erilyn Pearman, APRN - NP   insulin glargine (LANTUS SOLOSTAR) 100 UNIT/ML injection pen Inject 45 Units into the skin nightly 09/15/21  Yes Jess Toney A Humaira Sculley, APRN - NP       History reviewed. No pertinent past medical history.     History reviewed. No pertinent surgical history.    REVIEW OF SYSTEMS:   ROS as noted in HPI, otherwise negative       BP (!) 143/74   Pulse 84   Temp 98 F (36.7 C)   Ht 5\' 8"  (1.727 m)   Wt 174 lb (78.9 kg)   SpO2 98%   BMI 26.46 kg/m      PHYSICAL EXAM:  General appearance - alert, well appearing, and in no distress  Neck - normal ROM, no cervical LAD, no thyroid enlargement or nodularity  Mouth - mucous membranes moist, pharynx normal without lesions, dental caries, many missing teeth  Chest - clear to auscultation, no wheezes, normal work of breathing  Heart - normal rate and regular rhythm, no murmurs noted  Neurological - alert, normal speech, no focal findings  Extremities - peripheral pulses normal, no edema  Skin - normal coloration and turgor, no rashes       Results for orders placed or performed in visit on 09/15/21   POC Glycated Hemoglobin (HBA1C) Test 11/16/21)   Result Value Ref Range    Hemoglobin A1C, POC 11.6 %            Results for orders placed or performed in visit on  09/15/21 (from the past 2160 hour(s))   POC Glycated Hemoglobin (HBA1C) Test 2161)   Result Value Ref Range    Hemoglobin A1C, POC 11.6 %  An electronic signature was used to authenticate this note.    --Thelma Barge Creston Klas, APRN - NP

## 2021-09-22 NOTE — Addendum Note (Signed)
Addended by: Silas Sacramento A on: 09/22/2021 04:53 PM     Modules accepted: Orders

## 2021-09-29 NOTE — Telephone Encounter (Signed)
Patient would like a refill on generic brand of Viagara sent to [id number] pt scheduled to est care on 10/19 with labs prior and saw stacy for med refill on 09/22/21

## 2021-09-30 NOTE — Telephone Encounter (Signed)
Patient would like a refill on generic brand of Viagara sent to [id number] pt scheduled to est care on 10/19 with labs prior and saw stacy for med refill on 09/22/21

## 2021-10-02 ENCOUNTER — Inpatient Hospital Stay
Admit: 2021-10-02 | Discharge: 2021-10-02 | Disposition: A | Discharge status: Home / Self Care | Payer: BLUE CROSS/BLUE SHIELD

## 2021-10-02 ENCOUNTER — Encounter: Payer: BLUE CROSS/BLUE SHIELD | Attending: Family | Primary: Diagnostic Radiology

## 2021-10-02 DIAGNOSIS — E860 Dehydration: Secondary | ICD-10-CM

## 2021-10-02 LAB — CBC WITH AUTO DIFFERENTIAL
Absolute Baso #: 0 10*3/uL (ref 0.0–0.2)
Absolute Eos #: 0.4 10*3/uL (ref 0.0–0.5)
Absolute Lymph #: 2.7 10*3/uL (ref 1.0–3.2)
Absolute Mono #: 0.5 10*3/uL (ref 0.3–1.0)
Basophils %: 0.4 % (ref 0.0–2.0)
Eosinophils %: 3.3 % (ref 0.0–7.0)
Hematocrit: 40.3 % (ref 38.0–52.0)
Hemoglobin: 14.1 g/dL (ref 13.0–17.3)
Immature Grans (Abs): 0.06 10*3/uL (ref 0.00–0.06)
Immature Granulocytes: 0.6 % (ref 0.0–0.6)
Lymphocytes: 25.7 % (ref 15.0–45.0)
MCH: 29.8 pg (ref 27.0–34.5)
MCHC: 35 g/dL (ref 32.0–36.0)
MCV: 85.2 fL (ref 84.0–100.0)
MPV: 9.6 fL (ref 7.2–13.2)
Monocytes: 4.8 % (ref 4.0–12.0)
NRBC Absolute: 0 10*3/uL (ref 0.000–0.012)
NRBC Automated: 0 % (ref 0.0–0.2)
Neutrophils %: 65.2 % (ref 42.0–74.0)
Neutrophils Absolute: 6.9 10*3/uL (ref 1.6–7.3)
Platelets: 257 10*3/uL (ref 140–440)
RBC: 4.73 x10e6/mcL (ref 4.00–5.60)
RDW: 12.6 % (ref 11.0–16.0)
WBC: 10.6 10*3/uL (ref 3.8–10.6)

## 2021-10-02 LAB — COMPREHENSIVE METABOLIC PANEL
ALT: 27 U/L (ref 0–50)
AST: 20 U/L (ref 0–50)
Albumin/Globulin Ratio: 1.48 (ref 1.00–2.70)
Albumin: 4.3 g/dL (ref 3.5–5.2)
Alk Phosphatase: 95 U/L (ref 40–130)
Anion Gap: 11 mmol/L (ref 2–17)
BUN: 27 mg/dL — ABNORMAL HIGH (ref 6–20)
CO2: 28 mmol/L (ref 22–29)
Calcium: 9.7 mg/dL (ref 8.6–10.0)
Chloride: 98 mmol/L (ref 98–107)
Creatinine: 1.4 mg/dL — ABNORMAL HIGH (ref 0.7–1.3)
Est, Glom Filt Rate: 60 mL/min/1.73m (ref >=60)
Globulin: 2.9 g/dL (ref 1.9–4.4)
Glucose: 163 mg/dL — ABNORMAL HIGH (ref 70–99)
OSMOLALITY CALCULATED: 282 mOsm/kg (ref 270–287)
Potassium: 4.2 mmol/L (ref 3.5–5.3)
Sodium: 137 mmol/L (ref 135–145)
Total Bilirubin: 0.2 mg/dL (ref 0.00–1.20)
Total Protein: 7.2 g/dL (ref 6.4–8.3)

## 2021-10-02 LAB — CK: Total CK: 132 U/L (ref 20–200)

## 2021-10-02 LAB — TROPONIN: Troponin T: <0.01 ng/mL (ref 0.000–0.010)

## 2021-10-02 MED ORDER — SILDENAFIL CITRATE 20 MG PO TABS
20 MG | ORAL_TABLET | Freq: Every day | ORAL | 1 refills | Status: AC | PRN
Start: 2021-10-02 — End: ?

## 2021-10-02 MED ORDER — ONDANSETRON HCL 4 MG/2ML IJ SOLN
4 MG/2ML | Freq: Once | INTRAMUSCULAR | Status: AC
Start: 2021-10-02 — End: 2021-10-02
  Administered 2021-10-02: 19:00:00 4 mg via INTRAVENOUS

## 2021-10-02 MED ORDER — SODIUM CHLORIDE 0.9 % IV BOLUS
0.9 % | Freq: Once | INTRAVENOUS | Status: AC
Start: 2021-10-02 — End: 2021-10-02
  Administered 2021-10-02: 18:00:00 1000 mL via INTRAVENOUS

## 2021-10-02 MED FILL — ONDANSETRON HCL 4 MG/2ML IJ SOLN: 4 MG/2ML | INTRAMUSCULAR | Qty: 2

## 2021-10-02 NOTE — ED Provider Notes (Signed)
[facility] EMERGENCY DEPT  EMERGENCY DEPARTMENT ENCOUNTER      Pt Name: Harry Mitchell  MRN: [id number]  Birthdate [birth year]  Date of evaluation: 10/02/2021  Provider evaluation time: 10/02/21 1331  Provider: Philomena Doheny Harsimran Westman, PA-C    CHIEF COMPLAINT       Chief Complaint   Patient presents with    Hypotension     BIB EMS from urgent care for a SBP in the 80s. Pt. States he went to urgent care because he felt faint and was worried about his BGL, has a hx of dm. EMS gave 500L LR.         HISTORY OF PRESENT ILLNESS   (Location/Symptom, Timing/Onset, Context/Setting, Quality, Duration, Modifying Factors, Severity)  Note limiting factors.       Patient is a 51 year old male who presents emergency department with complaints of lightheadedness.  He arrives via EMS.  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  Upon arrival he is normotensive.  Denies any chest pain or shortness of breath.  No other complaints at this time.        Nursing Notes were reviewed.    REVIEW OF SYSTEMS    (2-9 systems for level 4, 10 or more for level 5)     Review of Systems   Gastrointestinal:  Positive for nausea. Negative for vomiting.   Neurological:  Positive for light-headedness.   All other systems reviewed and are negative.    Except as noted above the remainder of the review of systems was reviewed and negative.       PAST MEDICAL HISTORY   No past medical history on file.      SURGICAL HISTORY     No past surgical history on file.      CURRENT MEDICATIONS       Previous Medications    ATORVASTATIN (LIPITOR) 40 MG TABLET    Take 1 tablet by mouth daily    FLUOXETINE (PROZAC) 40 MG CAPSULE    Take 1 capsule by mouth daily    HYDROCHLOROTHIAZIDE (HYDRODIURIL) 25 MG TABLET    Take 1 tablet by mouth daily    INSULIN  GLARGINE (LANTUS SOLOSTAR) 100 UNIT/ML INJECTION PEN    Inject 45 Units into the skin nightly    LOSARTAN (COZAAR) 100 MG TABLET    Take 1 tablet by mouth daily    METFORMIN (GLUCOPHAGE-XR) 500 MG EXTENDED RELEASE TABLET    Take 2 tablets by mouth with breakfast and with evening meal    SEMGLEE, YFGN, 100 UNIT/ML SOPN    Inject 45 units under skin daily at 8 am    SILDENAFIL (REVATIO) 20 MG TABLET    TAKE 2 TO 5 TABLETS BY MOUTH ONCE DAILY AS NEEDED       ALLERGIES     Gabapentin and Sulfa antibiotics    FAMILY HISTORY     No family history on file.       SOCIAL HISTORY       Social History     Socioeconomic History    Marital status: Single   Tobacco Use    Smoking status: Some Days     Types: Cigarettes    Smokeless tobacco: Never   Substance and Sexual Activity    Alcohol use: Not Currently  Drug use: Not Currently     Types: Methamphetamines (Crystal Meth)       SCREENINGS         Glasgow Coma Scale  Eye Opening: Spontaneous  Best Verbal Response: Oriented  Best Motor Response: Obeys commands  Glasgow Coma Scale Score: 15                     CIWA Assessment  BP: (!) 142/83  Pulse: 80                 PHYSICAL EXAM    (up to 7 for level 4, 8 or more for level 5)     ED Triage Vitals [10/02/21 1330]   BP Temp Temp Source Pulse Respirations SpO2 Height Weight - Scale   (!) 142/83 98 F (36.7 C) Oral 80 16 100 % 5\' 9"  (1.753 m) 172 lb (78 kg)       Physical Exam  Vitals and nursing note reviewed.   Constitutional:       General: He is not in acute distress.     Appearance: Normal appearance. He is not ill-appearing, toxic-appearing or diaphoretic.   HENT:      Head: Normocephalic and atraumatic.      Mouth/Throat:      Mouth: Mucous membranes are moist.   Eyes:      Extraocular Movements: Extraocular movements intact.      Pupils: Pupils are equal, round, and reactive to light.   Cardiovascular:      Rate and Rhythm: Normal rate and regular rhythm.      Pulses: Normal pulses.      Heart sounds: Normal heart  sounds. No murmur heard.    No gallop.   Pulmonary:      Effort: Pulmonary effort is normal.      Breath sounds: Normal breath sounds. No stridor. No wheezing or rhonchi.   Abdominal:      General: Abdomen is flat. There is no distension.      Palpations: Abdomen is soft. There is no mass.      Tenderness: There is no abdominal tenderness. There is no guarding or rebound.   Musculoskeletal:         General: Normal range of motion.      Cervical back: Normal range of motion and neck supple.   Skin:     General: Skin is warm and dry.      Capillary Refill: Capillary refill takes less than 2 seconds.   Neurological:      General: No focal deficit present.      Mental Status: He is alert and oriented to person, place, and time.   Psychiatric:         Mood and Affect: Mood normal.         Behavior: Behavior normal.       DIAGNOSTIC RESULTS     EKG: All EKG's are interpreted by the Emergency Department Physician who either signs or Co-signs this chart in the absence of a cardiologist.      RADIOLOGY:   Non-plain film images such as CT, Ultrasound and MRI are read by the radiologist. Plain radiographic images are visualized and preliminarily interpreted by the emergency physician with the below findings:        Interpretation per the Radiologist below, if available at the time of this note:    No orders to display         ED BEDSIDE ULTRASOUND:  Performed by ED Physician - none    LABS:  Labs Reviewed   COMPREHENSIVE METABOLIC PANEL - Abnormal; Notable for the following components:       Result Value    Glucose 163 (*)     BUN 27 (*)     Creatinine 1.4 (*)     All other components within normal limits   CBC WITH AUTO DIFFERENTIAL   TROPONIN   CK       All other labs were within normal range or not returned as of this dictation.    EMERGENCY DEPARTMENT COURSE and DIFFERENTIAL DIAGNOSIS/MDM:   Vitals:    Vitals:    10/02/21 1330   BP: (!) 142/83   Pulse: 80   Resp: 16   Temp: 98 F (36.7 C)   TempSrc: Oral   SpO2: 100%    Weight: 78 kg   Height: 5\' 9"  (1.753 m)         MDM  Number of Diagnoses or Management Options  Diagnosis management comments: Differential diagnosis seen exhaustion, heat stroke, ACS, arrhythmia, dehydration  Patient in the emergency department for evaluation of lightheadedness while working as a outside, was hypotensive on scene, normotensive throughout his stay in the emergency department, his laboratory evaluation shows minimally elevated kidney function, he feels better after fluids and Zofran in the emergency department recommended rest, push clear fluids, follow-up with primary care doctor return immediately to the emergency department for any worsening symptoms problems or concerns.  He is in agreement with this plan of care, discharged to home in stable condition.       Amount and/or Complexity of Data Reviewed  Clinical lab tests: reviewed  Tests in the medicine section of CPT: reviewed          REASSESSMENT          CRITICAL CARE TIME       CONSULTS:  None    PROCEDURES:  Unless otherwise noted below, none     Procedures        FINAL IMPRESSION      1. Dehydration          DISPOSITION/PLAN   DISPOSITION Decision To Discharge 10/02/2021 03:25:27 PM      PATIENT REFERRED TO:  Provider Unknown            DISCHARGE MEDICATIONS:  New Prescriptions    No medications on file     Controlled Substances Monitoring:     No flowsheet data found.    (Please note that portions of this note were completed with a voice recognition program.  Efforts were made to edit the dictations but occasionally words are mis-transcribed.)    10/04/2021, PA-C (electronically signed)  Attending Emergency Physician           Eliot Ford, PA-C  10/02/21 1525

## 2021-10-02 NOTE — Discharge Instructions (Signed)
Rest, push clear fluids, follow-up with your primary care doctor, return to the emergency department for any worsening symptoms problems or concerns

## 2021-10-02 NOTE — Telephone Encounter (Signed)
rx sent

## 2021-10-05 LAB — EKG 12-LEAD
P Axis: 80 degrees
P-R Interval: 154 ms
Q-T Interval: 424 ms
QRS Duration: 84 ms
QTc Calculation (Bazett): 418 ms
R Axis: 93 degrees
T Axis: 66 degrees
Ventricular Rate: 57 {beats}/min

## 2021-10-06 ENCOUNTER — Telehealth: Payer: Self-pay | Admitting: General Practice

## 2021-10-06 NOTE — Telephone Encounter (Signed)
Transition Care Management Unsuccessful Follow-up Telephone Call  Date of discharge and from where:  10/02/21 from [facility]  Attempts:  1st Attempt  Reason for unsuccessful TCM follow-up call:  No answer/busy

## 2021-10-07 NOTE — Telephone Encounter (Signed)
Transition Care Management Unsuccessful Follow-up Telephone Call  Date of discharge and from where:  10/02/21 from [facility]  Attempts:  2nd Attempt  Reason for unsuccessful TCM follow-up call:  No answer/busy

## 2021-10-09 ENCOUNTER — Telehealth

## 2021-10-09 NOTE — Telephone Encounter (Addendum)
[facility] and the [facility] was closed for lunch.    [facility] and latus is non-formulary suggest semglee or glargine-fygn

## 2021-10-09 NOTE — Telephone Encounter (Signed)
[facility] is [facility] an alternative medication for the lantus solostar 100units/ pen  It is not covered by insurance.

## 2021-10-12 MED ORDER — INSULIN GLARGINE-YFGN 100 UNIT/ML SC SOPN
100 UNIT/ML | Freq: Every evening | SUBCUTANEOUS | 5 refills | Status: AC
Start: 2021-10-12 — End: 2022-03-04

## 2021-10-12 NOTE — Addendum Note (Signed)
Addended by: Blossom Hoops on: 10/12/2021 12:25 PM     Modules accepted: Orders

## 2021-10-12 NOTE — Telephone Encounter (Signed)
I called [facility] to verify the formulary medication I should have started a new note and adden the old one.

## 2021-10-12 NOTE — Telephone Encounter (Signed)
Transition Care Management Unsuccessful Follow-up Telephone Call  Date of discharge and from where:  10/02/21 from [facility]  Attempts:  3rd Attempt  Reason for unsuccessful TCM follow-up call:  No answer/busy

## 2021-12-10 NOTE — Telephone Encounter (Signed)
Last appt 09/15/21 and next scheduled 03/31/21

## 2021-12-10 NOTE — Telephone Encounter (Signed)
Patient is requesting a refill of sidenafil 20mg  and wants [facility] [address]

## 2021-12-11 MED ORDER — SILDENAFIL CITRATE 20 MG PO TABS
20 MG | ORAL_TABLET | Freq: Every day | ORAL | 1 refills | Status: DC | PRN
Start: 2021-12-11 — End: 2022-03-31

## 2021-12-18 ENCOUNTER — Encounter: Primary: Diagnostic Radiology

## 2021-12-24 ENCOUNTER — Encounter: Attending: Family Medicine | Primary: Diagnostic Radiology

## 2022-01-27 ENCOUNTER — Other Ambulatory Visit: Payer: Self-pay | Admitting: Family Medicine

## 2022-01-27 DIAGNOSIS — I10 Essential (primary) hypertension: Secondary | ICD-10-CM

## 2022-03-04 ENCOUNTER — Encounter: Payer: BLUE CROSS/BLUE SHIELD | Attending: Physician Assistant | Primary: Family Medicine

## 2022-03-04 ENCOUNTER — Encounter

## 2022-03-04 MED ORDER — LANTUS SOLOSTAR 100 UNIT/ML SC SOPN
100 UNIT/ML | Freq: Every evening | SUBCUTANEOUS | 5 refills | Status: DC
Start: 2022-03-04 — End: 2022-03-11

## 2022-03-05 NOTE — Telephone Encounter (Signed)
N/a

## 2022-03-11 ENCOUNTER — Encounter

## 2022-03-11 MED ORDER — LANTUS SOLOSTAR 100 UNIT/ML SC SOPN
100 UNIT/ML | Freq: Every evening | SUBCUTANEOUS | 2 refills | Status: DC
Start: 2022-03-11 — End: 2022-04-13

## 2022-03-16 LAB — COMPREHENSIVE METABOLIC PANEL
ALT: 33 U/L (ref 0–50)
AST: 26 U/L (ref 0–50)
Albumin/Globulin Ratio: 2.1 (ref 1.00–2.70)
Albumin: 4.2 g/dL (ref 3.5–5.2)
Alk Phosphatase: 84 U/L (ref 40–130)
Anion Gap: 9 mmol/L (ref 2–17)
BUN: 14 mg/dL (ref 6–20)
CO2: 28 mmol/L (ref 22–29)
Calcium: 9.4 mg/dL (ref 8.6–10.0)
Chloride: 101 mmol/L (ref 98–107)
Creatinine: 0.8 mg/dL (ref 0.7–1.3)
Est, Glom Filt Rate: 106 mL/min/1.73m (ref >=60)
Globulin: 2 g/dL (ref 1.9–4.4)
Glucose: 185 mg/dL — ABNORMAL HIGH (ref 70–99)
OSMOLALITY CALCULATED: 281 mOsm/kg (ref 270–287)
Potassium: 4.3 mmol/L (ref 3.5–5.3)
Sodium: 138 mmol/L (ref 135–145)
Total Bilirubin: 0.25 mg/dL (ref 0.00–1.20)
Total Protein: 6.2 g/dL — ABNORMAL LOW (ref 6.4–8.3)

## 2022-03-16 LAB — CBC
Hematocrit: 42.6 % (ref 38.0–52.0)
Hemoglobin: 14.4 g/dL (ref 13.0–17.3)
MCH: 28.7 pg (ref 27.0–34.5)
MCHC: 33.8 g/dL (ref 32.0–36.0)
MCV: 84.9 fL (ref 84.0–100.0)
MPV: 10.6 fL (ref 7.2–13.2)
NRBC Absolute: 0 10*3/uL (ref 0.000–0.012)
NRBC Automated: 0 % (ref 0.0–0.2)
Platelets: 306 10*3/uL (ref 140–440)
RBC: 5.02 x10e6/mcL (ref 4.00–5.60)
RDW: 13 % (ref 11.0–16.0)
WBC: 7.3 10*3/uL (ref 3.8–10.6)

## 2022-03-16 LAB — LIPID PANEL
Chol/HDL Ratio: 3.1 (ref 0.0–4.4)
Cholesterol: 134 mg/dL (ref 100–200)
HDL: 43 mg/dL (ref >=40)
LDL Cholesterol: 76.8 mg/dL (ref 0.0–100.0)
LDL/HDL Ratio: 1.8
Triglycerides: 71 mg/dL (ref 0–149)
VLDL: 14.2 mg/dL (ref 5.0–40.0)

## 2022-03-16 LAB — URINE DRUG SCREEN
Amphetamine Screen, Urine: NEGATIVE
Barbiturate Screen, Ur: NEGATIVE
Benzodiazepine Screen, Urine: NEGATIVE
Cannabinoid Scrn, Ur: NEGATIVE
Cocaine Screen Urine: NEGATIVE
Fentanyl, Ur: NEGATIVE
Opiate Screen, Urine: NEGATIVE

## 2022-03-16 LAB — HEMOGLOBIN A1C
Est. Avg. Glucose, WB: 338
Est. Avg. Glucose-calculated: 400
Hemoglobin A1C: 13.4 % — ABNORMAL HIGH (ref 4.0–6.0)

## 2022-03-23 NOTE — Other (Signed)
Patient has glucometer at home and is checking sugars in the morning.  He is also working on diet, per [facility].

## 2022-03-26 ENCOUNTER — Encounter: Primary: Family Medicine

## 2022-03-31 ENCOUNTER — Encounter
Admit: 2022-03-31 | Discharge: 2022-03-31 | Payer: BLUE CROSS/BLUE SHIELD | Attending: Family Medicine | Primary: Diagnostic Radiology

## 2022-03-31 DIAGNOSIS — E118 Type 2 diabetes mellitus with unspecified complications: Secondary | ICD-10-CM

## 2022-03-31 LAB — AMB POC GLUCOSE BLOOD, BY GLUCOSE MONITORING DEVICE: Glucose, POC: 11.2 MG/DL

## 2022-03-31 LAB — AMB POC HEMOGLOBIN A1C: Hemoglobin A1C, POC: 11.1 %

## 2022-03-31 MED ORDER — EMPAGLIFLOZIN 10 MG PO TABS
10 MG | ORAL_TABLET | Freq: Every day | ORAL | 5 refills | Status: AC
Start: 2022-03-31 — End: 2022-04-13

## 2022-03-31 MED ORDER — LOSARTAN POTASSIUM 100 MG PO TABS
100 MG | ORAL_TABLET | Freq: Every day | ORAL | 1 refills | Status: AC
Start: 2022-03-31 — End: 2022-07-30

## 2022-03-31 MED ORDER — SILDENAFIL CITRATE 20 MG PO TABS
20 MG | ORAL_TABLET | Freq: Every day | ORAL | 3 refills | Status: AC | PRN
Start: 2022-03-31 — End: 2022-07-30

## 2022-03-31 NOTE — Progress Notes (Signed)
Harry Mitchell (DOB:  May 28, 1970) is a 52 y.o. male,Established patient, here for evaluation of the following chief complaint(s):  Discuss Medications (Increase of pain in the feet )      ASSESSMENT/PLAN:      ICD-10-CM    1. Type 2 diabetes mellitus with complication (HCC)  E11.8 POC Glucose Blood Test (71696)     empagliflozin (JARDIANCE) 10 MG tablet     Denice Bors, DPM - Podiatry     Comprehensive Metabolic Panel     Hemoglobin V8L      2. Foot pain, bilateral  M79.671 Denice Bors, DPM - Podiatry    [phone]            Diabetes - UNCONTROLLED, A1C above goal of 7 despite taking medication with A1C 13.4; poc A1c 11.2.. Discussed need for compliance with medication, reviewed how medication is taken. Discussed lifestyle modification with weight loss, limiting foods high in sugar / simple carbs. No known complications related to uncontrolled diabetes at this time. Discussed yearly eye checks.   - will not make any changes other than taking med consistently , BG levels have improved past two weeks   - if BG consistently above 150  - increase lantus by 2U   - add jardiance, gave coupon as well   - recheck in 3 mo     Foot pain - Likely component of neuropathy with uncontrolled diabetes but also possible arthritis /overuse related pain due to work/being on feet all day. Has good pulses , peripheral vascular disease possible but unlikely. Discussed possible med treatment but with work would be concerned about sedatoin with med like gabapentin. Pt in agreement and wants to avoid med esp gabapentin.   - referring to podiatry, may benefit from inserts, otherwise continue diabetes control / management    Hypertension - Blood pressure overall controlled. Denies side effects from medications. Denies chest pain, shortness of breath or headache. Reviewed last CMP and updated labs as indicated. Reviewed medications and refills provided if needed    HLD- Continue with current medication.  Due for recheck of lipid  levels.    Anxiety- Continue with Prozac as prescribed. Does have substance abuse history and history of anxiety and depression.    Return for 3 month MD f/u DM2 with labs prior .    51025 Billed for moderate complexity with uncontrolled chronic condition, change in medications, orders placed for further evaluation.        SUBJECTIVE/OBJECTIVE:  HPI    Harry Mitchell is a 52 y.o. male  that presents for follow-up; first visit with MD, did see Stacy several months ago.     HTN- Currently taking HCTZ and Losartan for BP control.  Denies any s/e of use.  BP elevated initially.  Does not check at home.  No c/o chest pain, SOB, or HA.    HLD- Currently taking 40 mg atorvastatin. No medication s/e.    Anxiety/depression- Currently taking 40 Prozac.  Patient reports he has been on same medication for a few years.  He inadvertently stopped medication and realized how much it did help with depression symptoms.      DM2-  Recent A1C 13.4.  He is currently taking Metformin 1000mg  BID and 45 units of lantus in the morning.  Increased dose of metformin to twice daily - would occasionally fall asleep and forget to medication but has been doing much better since labs drawn 2 weeks ago.  Has been checking sugars recently 107- 150.  Foot pain - bilat, equal. No injury. Feels burning/ pins and needles as well as numbness. Denies leg /calf cramping. Denies cold to touch. Is on feet all day working for [employer].     Patient with history of substance abuse.  Has been clean for a few months and moved to [facility] to get his life back on track.  He is currently working hard in a greenhouse, labor intensive work.      Current Outpatient Medications   Medication Sig Dispense Refill    losartan (COZAAR) 100 MG tablet Take 1 tablet by mouth daily 90 tablet 1    sildenafil (REVATIO) 20 MG tablet Take 1 tablet by mouth daily as needed (ED; take 30 min prior to sexual activity) 10 tablet 3    empagliflozin (JARDIANCE) 10 MG tablet  Take 1 tablet by mouth daily 30 tablet 5    insulin glargine (LANTUS SOLOSTAR) 100 UNIT/ML injection pen Inject 45 Units into the skin nightly Patient to inject 45-50 units into skin nightly 5 Adjustable Dose Pre-filled Pen Syringe 2    hydroCHLOROthiazide (HYDRODIURIL) 25 MG tablet Take 1 tablet by mouth daily      FLUoxetine (PROZAC) 40 MG capsule Take 1 capsule by mouth daily      atorvastatin (LIPITOR) 40 MG tablet Take 1 tablet by mouth daily      metFORMIN (GLUCOPHAGE-XR) 500 MG extended release tablet Take 2 tablets by mouth with breakfast and with evening meal 360 tablet 1     No current facility-administered medications for this visit.        Prior to Admission medications    Medication Sig Start Date End Date Taking? Authorizing Provider   losartan (COZAAR) 100 MG tablet Take 1 tablet by mouth daily 03/31/22  Yes Marca Ancona, MD   sildenafil (REVATIO) 20 MG tablet Take 1 tablet by mouth daily as needed (ED; take 30 min prior to sexual activity) 03/31/22  Yes Marca Ancona, MD   empagliflozin (JARDIANCE) 10 MG tablet Take 1 tablet by mouth daily 03/31/22  Yes Marca Ancona, MD   insulin glargine (LANTUS SOLOSTAR) 100 UNIT/ML injection pen Inject 45 Units into the skin nightly Patient to inject 45-50 units into skin nightly 03/11/22 04/10/22  Messersmith, Stacy A, APRN - NP   hydroCHLOROthiazide (HYDRODIURIL) 25 MG tablet Take 1 tablet by mouth daily 08/01/21   [provider]   FLUoxetine (PROZAC) 40 MG capsule Take 1 capsule by mouth daily 08/01/21   [provider]   atorvastatin (LIPITOR) 40 MG tablet Take 1 tablet by mouth daily 08/01/21   [provider]   metFORMIN (GLUCOPHAGE-XR) 500 MG extended release tablet Take 2 tablets by mouth with breakfast and with evening meal 09/15/21   Messersmith, Thelma Barge, APRN - NP       History reviewed. No pertinent past medical history.     No past surgical history on file.    REVIEW OF SYSTEMS:   ROS as noted in HPI, otherwise negative       BP  138/80   Pulse 65   Ht 1.753 m (5\' 9" )   Wt 79.4 kg (175 lb)   SpO2 97%   BMI 25.84 kg/m      PHYSICAL EXAM:  General appearance - alert, well appearing, and in no distress  Neck - normal ROM, no cervical LAD, no thyroid enlargement or nodularity  Mouth - mucous membranes moist, pharynx normal without lesions, missing teeth  Chest - clear  to auscultation, no wheezes, normal work of breathing  Heart - normal rate and regular rhythm, no murmurs noted  Neurological - alert, normal speech, no focal findings  Extremities - peripheral pulses normal, no edema  Skin - normal coloration and turgor, no rashes  FEET - done 03/31/22:   Visual inspection:  Deformity/amputation: absent  Skin lesions/pre-ulcerative calluses: present - bilat feet at first mcp joint   Edema: right- negative, left- negative    Sensory exam:  Monofilament sensation: normal  (minimum of 5 random plantar locations tested, avoiding callused areas - > 1 area with absence of sensation is + for neuropathy)    Plus at least one of the following:  Pulses: normal,   Pinprick: N/A  Proprioception: N/A  Vibration (128 Hz): N/A         Results for orders placed or performed in visit on 03/31/22   POC Glucose Blood Test (44010)   Result Value Ref Range    Glucose, POC 11.2 MG/DL          Results for orders placed or performed in visit on 03/31/22 (from the past 2160 hour(s))   POC Glucose Blood Test (27253)   Result Value Ref Range    Glucose, POC 11.2 MG/DL   Results for orders placed or performed in visit on 09/15/21 (from the past 2160 hour(s))   CBC   Result Value Ref Range    WBC 7.3 3.8 - 10.6 x10e3/mcL    RBC 5.02 4.00 - 5.60 x10e6/mcL    Hemoglobin 14.4 13.0 - 17.3 g/dL    Hematocrit 66.4 40.3 - 52.0 %    MCV 84.9 84.0 - 100.0 fL    MCH 28.7 27.0 - 34.5 pg    MCHC 33.8 32.0 - 36.0 g/dL    RDW 47.4 25.9 - 56.3 %    Platelets 306 140 - 440 x10e3/mcL    MPV 10.6 7.2 - 13.2 fL    NRBC Automated 0.0 0.0 - 0.2 %    NRBC Absolute 0.000 0.000 - 0.012  x10e3/mcL   Lipid Panel   Result Value Ref Range    Cholesterol 134 100 - 200 mg/dL    HDL 43 >=87 mg/dL    Triglycerides 71 0 - 149 mg/dL    LDL Cholesterol 56.4 0.0 - 100.0 mg/dL    LDL/HDL Ratio 1.8     Chol/HDL Ratio 3.1 0.0 - 4.4    VLDL 14.2 5.0 - 40.0 mg/dL   Comprehensive Metabolic Panel   Result Value Ref Range    Sodium 138 135 - 145 mmol/L    Potassium 4.3 3.5 - 5.3 mmol/L    Chloride 101 98 - 107 mmol/L    CO2 28 22 - 29 mmol/L    Glucose 185 (H) 70 - 99 mg/dL    BUN 14 6 - 20 mg/dL    Creatinine 0.8 0.7 - 1.3 mg/dL    Anion Gap 9 2 - 17 mmol/L    OSMOLALITY CALCULATED 281 270 - 287 mOsm/kg    Calcium 9.4 8.6 - 10.0 mg/dL    Total Protein 6.2 (L) 6.4 - 8.3 g/dL    Albumin 4.2 3.5 - 5.2 g/dL    Globulin 2.0 1.9 - 4.4 g/dL    Albumin/Globulin Ratio 2.10 1.00 - 2.70    Total Bilirubin 0.25 0.00 - 1.20 mg/dL    Alk Phosphatase 84 40 - 130 unit/L    AST 26 0 - 50 unit/L    ALT 33 0 -  50 unit/L    Est, Glom Filt Rate 106 >=60 mL/min/1.56m   Hemoglobin A1C   Result Value Ref Range    Hemoglobin A1C 13.4 (H) 4.0 - 6.0 %    Est. Avg. Glucose, WB 338     Est. Avg. Glucose-calculated 400    Urine Drug Screen   Result Value Ref Range    Amphetamine Screen, Urine Negative Negative    Benzodiazepine Screen, Urine Negative Negative    Barbiturate Screen, Ur Negative Negative    Cannabinoid Scrn, Ur Negative Negative    Opiate Screen, Urine Negative Negative    Fentanyl, Ur Negative Negative    Cocaine Screen Urine Negative Negative           An electronic signature was used to authenticate this note.    --Marca Ancona, MD

## 2022-03-31 NOTE — Patient Instructions (Signed)
-  if BG consistently above 150  - increase lantus by 2U

## 2022-04-02 NOTE — Addendum Note (Signed)
Addended by: Nicholes Rough A on: 04/02/2022 11:47 AM     Modules accepted: Orders

## 2022-04-13 ENCOUNTER — Other Ambulatory Visit: Payer: Self-pay | Admitting: Family Medicine

## 2022-04-13 ENCOUNTER — Encounter

## 2022-04-13 DIAGNOSIS — F419 Anxiety disorder, unspecified: Secondary | ICD-10-CM

## 2022-04-13 MED ORDER — HYDROCHLOROTHIAZIDE 25 MG PO TABS
25 MG | ORAL_TABLET | Freq: Every day | ORAL | 1 refills | Status: DC
Start: 2022-04-13 — End: 2022-04-22

## 2022-04-13 MED ORDER — METFORMIN HCL ER 500 MG PO TB24
500 MG | ORAL_TABLET | Freq: Two times a day (BID) | ORAL | 1 refills | Status: DC
Start: 2022-04-13 — End: 2022-04-22

## 2022-04-13 MED ORDER — FLUOXETINE HCL 40 MG PO CAPS
40 | ORAL_CAPSULE | Freq: Every day | ORAL | 3 refills | Status: DC
Start: 2022-04-13 — End: 2022-04-13

## 2022-04-13 MED ORDER — FLUOXETINE HCL 40 MG PO CAPS
40 MG | ORAL_CAPSULE | Freq: Every day | ORAL | 1 refills | Status: DC
Start: 2022-04-13 — End: 2022-04-22

## 2022-04-13 MED ORDER — EMPAGLIFLOZIN 10 MG PO TABS
10 MG | ORAL_TABLET | Freq: Every day | ORAL | 1 refills | Status: DC
Start: 2022-04-13 — End: 2022-04-22

## 2022-04-13 MED ORDER — LANTUS SOLOSTAR 100 UNIT/ML SC SOPN
100 UNIT/ML | Freq: Every evening | SUBCUTANEOUS | 2 refills | Status: DC
Start: 2022-04-13 — End: 2022-04-22

## 2022-04-13 MED ORDER — ATORVASTATIN CALCIUM 40 MG PO TABS
40 MG | ORAL_TABLET | Freq: Every day | ORAL | 1 refills | Status: DC
Start: 2022-04-13 — End: 2022-04-22

## 2022-04-13 NOTE — Telephone Encounter (Signed)
Patient is requesting the following med refill:    Atrovastatin 40mg   HCTZ 25MG   Metformin 500mg   Fluoxetine 40mg   Jardiance 10mg     Needs 90 supply for all the above.    Lantus [employer]

## 2022-04-13 NOTE — Telephone Encounter (Signed)
Requested Prescriptions     Pending Prescriptions Disp Refills    atorvastatin (LIPITOR) 40 MG tablet 90 tablet 1     Sig: Take 1 tablet by mouth daily    hydroCHLOROthiazide (HYDRODIURIL) 25 MG tablet 90 tablet 1     Sig: Take 1 tablet by mouth daily    metFORMIN (GLUCOPHAGE-XR) 500 MG extended release tablet 360 tablet 1     Sig: Take 2 tablets by mouth with breakfast and with evening meal    FLUoxetine (PROZAC) 40 MG capsule 30 capsule 3     Sig: Take 1 capsule by mouth daily    insulin glargine (LANTUS SOLOSTAR) 100 UNIT/ML injection pen 5 Adjustable Dose Pre-filled Pen Syringe 2     Sig: Inject 45 Units into the skin nightly Patient to inject 45-50 units into skin nightly    empagliflozin (JARDIANCE) 10 MG tablet 90 tablet 1     Sig: Take 1 tablet by mouth daily    Medication loaded please es. Last visit 01/24 upcoming 05/21

## 2022-04-15 NOTE — Telephone Encounter (Signed)
Formatting of this note might be different from the original.  Pt will need an appointment for medication refills. Please call and have him to schedule this. Thank you  Electronically signed by Deno Etienne, CMA at 04/15/2022  2:24 PM EST

## 2022-04-15 NOTE — Telephone Encounter (Signed)
Formatting of this note might be different from the original.  Called pt and phone number is not a working number. Tvt   Electronically signed by Sinclair Grooms at 04/15/2022  3:46 PM EST

## 2022-04-15 NOTE — Telephone Encounter (Signed)
Pt will need an appointment for medication refills. Please call and have him to schedule this. Thank you

## 2022-04-15 NOTE — Telephone Encounter (Signed)
Called pt and phone number is not a working number. Tvt

## 2022-04-22 ENCOUNTER — Encounter

## 2022-04-22 MED ORDER — EMPAGLIFLOZIN 10 MG PO TABS
10 MG | ORAL_TABLET | Freq: Every day | ORAL | 1 refills | Status: AC
Start: 2022-04-22 — End: 2022-10-19

## 2022-04-22 MED ORDER — LANTUS SOLOSTAR 100 UNIT/ML SC SOPN
100 UNIT/ML | Freq: Every evening | SUBCUTANEOUS | 2 refills | Status: AC
Start: 2022-04-22 — End: 2022-05-22

## 2022-04-22 MED ORDER — FLUOXETINE HCL 40 MG PO CAPS
40 MG | ORAL_CAPSULE | Freq: Every day | ORAL | 1 refills | Status: AC
Start: 2022-04-22 — End: 2022-07-30

## 2022-04-22 MED ORDER — ATORVASTATIN CALCIUM 40 MG PO TABS
40 MG | ORAL_TABLET | Freq: Every day | ORAL | 1 refills | Status: AC
Start: 2022-04-22 — End: 2022-10-19

## 2022-04-22 MED ORDER — METFORMIN HCL ER 500 MG PO TB24
500 MG | ORAL_TABLET | Freq: Two times a day (BID) | ORAL | 1 refills | Status: AC
Start: 2022-04-22 — End: 2022-07-30

## 2022-04-22 MED ORDER — HYDROCHLOROTHIAZIDE 25 MG PO TABS
25 MG | ORAL_TABLET | Freq: Every day | ORAL | 1 refills | Status: AC
Start: 2022-04-22 — End: 2022-10-19

## 2022-04-22 NOTE — Telephone Encounter (Signed)
Requested Prescriptions     Pending Prescriptions Disp Refills    atorvastatin (LIPITOR) 40 MG tablet 90 tablet 1     Sig: Take 1 tablet by mouth daily    empagliflozin (JARDIANCE) 10 MG tablet 90 tablet 1     Sig: Take 1 tablet by mouth daily    FLUoxetine (PROZAC) 40 MG capsule 90 capsule 1     Sig: Take 1 capsule by mouth daily    hydroCHLOROthiazide (HYDRODIURIL) 25 MG tablet 90 tablet 1     Sig: Take 1 tablet by mouth daily    insulin glargine (LANTUS SOLOSTAR) 100 UNIT/ML injection pen 5 Adjustable Dose Pre-filled Pen Syringe 2     Sig: Inject 45 Units into the skin nightly Patient to inject 45-50 units into skin nightly    metFORMIN (GLUCOPHAGE-XR) 500 MG extended release tablet 360 tablet 1     Sig: Take 2 tablets by mouth with breakfast and with evening meal    [facility] is being cancelled. Please send to corrected pharmacy.

## 2022-04-23 NOTE — Other (Signed)
A1c 11.1 on 03/31/22, reviewed during appt 03/31/22

## 2022-06-29 ENCOUNTER — Encounter: Primary: Family Medicine

## 2022-07-05 ENCOUNTER — Encounter: Attending: Family Medicine | Primary: Family Medicine

## 2022-07-16 ENCOUNTER — Encounter: Admit: 2022-07-16 | Discharge: 2022-07-16 | Payer: PRIVATE HEALTH INSURANCE | Primary: Family Medicine

## 2022-07-16 DIAGNOSIS — E118 Type 2 diabetes mellitus with unspecified complications: Secondary | ICD-10-CM

## 2022-07-16 LAB — COMPREHENSIVE METABOLIC PANEL
ALT: 20 U/L (ref 0–50)
AST: 21 U/L (ref 0–50)
Albumin/Globulin Ratio: 2.2 (ref 1.00–2.70)
Albumin: 4.6 g/dL (ref 3.5–5.2)
Alk Phosphatase: 90 U/L (ref 40–130)
Anion Gap: 10 mmol/L (ref 2–17)
BUN: 25 mg/dL — ABNORMAL HIGH (ref 6–20)
CO2: 30 mmol/L — ABNORMAL HIGH (ref 22–29)
Calcium: 10 mg/dL (ref 8.5–10.7)
Chloride: 98 mmol/L (ref 98–107)
Creatinine: 1.1 mg/dL (ref 0.7–1.3)
Est, Glom Filt Rate: 80 mL/min/1.73m (ref >=60)
Globulin: 2.1 g/dL (ref 1.9–4.4)
Glucose: 201 mg/dL — ABNORMAL HIGH (ref 70–99)
Osmolaliy Calculated: 286 mOsm/kg (ref 270–287)
Potassium: 4.8 mmol/L (ref 3.5–5.3)
Sodium: 138 mmol/L (ref 135–145)
Total Bilirubin: 0.3 mg/dL (ref 0.00–1.20)
Total Protein: 6.7 g/dL (ref 5.7–8.3)

## 2022-07-16 LAB — HEMOGLOBIN A1C
Estimated Avg Glucose: 237
Estimated Avg Glucose: 275
Hemoglobin A1C: 9.9 % — ABNORMAL HIGH (ref 4.0–6.0)

## 2022-07-16 NOTE — Other (Signed)
A1C high at 9.9, which is improved since 4 months ago when it was 13.4.  CMP:  slightly high CO2, high glucose at 201, high BUN, otherwise normal.   Dr. Carlynn Purl to review with pt at scheduled appt on 07/30/2022.

## 2022-07-27 ENCOUNTER — Encounter: Payer: PRIVATE HEALTH INSURANCE | Attending: Family Medicine | Primary: Family Medicine

## 2022-07-30 ENCOUNTER — Encounter
Admit: 2022-07-30 | Discharge: 2022-07-30 | Payer: PRIVATE HEALTH INSURANCE | Attending: Family Medicine | Primary: Family Medicine

## 2022-07-30 DIAGNOSIS — E118 Type 2 diabetes mellitus with unspecified complications: Secondary | ICD-10-CM

## 2022-07-30 MED ORDER — ATORVASTATIN CALCIUM 40 MG PO TABS
40 MG | ORAL_TABLET | Freq: Every day | ORAL | 1 refills | Status: DC
Start: 2022-07-30 — End: 2023-02-09

## 2022-07-30 MED ORDER — LOSARTAN POTASSIUM 100 MG PO TABS
100 MG | ORAL_TABLET | Freq: Every day | ORAL | 1 refills | Status: DC
Start: 2022-07-30 — End: 2023-02-09

## 2022-07-30 MED ORDER — METFORMIN HCL ER 500 MG PO TB24
500 MG | ORAL_TABLET | Freq: Two times a day (BID) | ORAL | 1 refills | Status: DC
Start: 2022-07-30 — End: 2023-02-09

## 2022-07-30 MED ORDER — FLUOXETINE HCL 40 MG PO CAPS
40 MG | ORAL_CAPSULE | Freq: Every day | ORAL | 1 refills | Status: DC
Start: 2022-07-30 — End: 2023-02-09

## 2022-07-30 MED ORDER — LANTUS SOLOSTAR 100 UNIT/ML SC SOPN
100 | Freq: Every evening | SUBCUTANEOUS | 2 refills | Status: AC
Start: 2022-07-30 — End: 2022-08-29

## 2022-07-30 MED ORDER — HYDROCHLOROTHIAZIDE 25 MG PO TABS
25 MG | ORAL_TABLET | Freq: Every day | ORAL | 1 refills | Status: DC
Start: 2022-07-30 — End: 2023-02-09

## 2022-07-30 MED ORDER — EMPAGLIFLOZIN 25 MG PO TABS
25 | ORAL_TABLET | Freq: Every day | ORAL | 1 refills | Status: DC
Start: 2022-07-30 — End: 2022-08-11

## 2022-07-30 MED ORDER — SILDENAFIL CITRATE 20 MG PO TABS
20 | ORAL_TABLET | Freq: Every day | ORAL | 3 refills | Status: DC | PRN
Start: 2022-07-30 — End: 2022-10-22

## 2022-07-30 NOTE — Progress Notes (Unsigned)
Harry Mitchell (DOB:  Jan 12, 1971) is a 52 y.o. male,Established patient, here for evaluation of the following chief complaint(s):  3 Month Follow-Up      ASSESSMENT/PLAN:      ICD-10-CM    1. Type 2 diabetes mellitus with complication (HCC)  E11.8 metFORMIN (GLUCOPHAGE-XR) 500 MG extended release tablet     insulin glargine (LANTUS SOLOSTAR) 100 UNIT/ML injection pen     empagliflozin (JARDIANCE) 25 MG tablet     Hemoglobin A1C     Comprehensive Metabolic Panel     Microalbumin / Creatinine Urine Ratio      2. Essential hypertension, benign  I10 losartan (COZAAR) 100 MG tablet     hydroCHLOROthiazide (HYDRODIURIL) 25 MG tablet     Comprehensive Metabolic Panel     CBC      3. Anxiety  F41.9 FLUoxetine (PROZAC) 40 MG capsule      4. Mixed hyperlipidemia  E78.2 atorvastatin (LIPITOR) 40 MG tablet      5. Erectile dysfunction, unspecified erectile dysfunction type  N52.9 sildenafil (REVATIO) 20 MG tablet      6. Screening for malignant neoplasm of prostate  Z12.5 PSA Screening      7. Need for hepatitis C screening test  Z11.59 Hepatitis C Antibody     HIV Screen      8. Screening for colorectal cancer  Z12.11 Maryla Morrow, MD - Gastroenterology    [phone]            Diabetes - UNCONTROLLED, A1C above goal of 7 despite taking medication with A1C 13.4; poc A1c 11.2.. Discussed need for compliance with medication, reviewed how medication is taken. Discussed lifestyle modification with weight loss, limiting foods high in sugar / simple carbs. No known complications related to uncontrolled diabetes at this time. Discussed yearly eye checks.   - will not make any changes other than taking med consistently , BG levels have improved past two weeks   - if BG consistently above 150  - increase lantus by 2U   - add jardiance, gave coupon as well - was off of medication for a month   - recheck in 3 mo   - discussed doing prevnar 20 at next visit    Foot pain - Likely component of neuropathy with uncontrolled diabetes but  also possible arthritis /overuse related pain due to work/being on feet all day. Has good pulses , peripheral vascular disease possible but unlikely. Discussed possible med treatment but with work would be concerned about sedatoin with med like gabapentin. Pt in agreement and wants to avoid med esp gabapentin.   - referring to podiatry, may benefit from inserts, otherwise continue diabetes control / management    Hypertension - Blood pressure overall controlled. Denies side effects from medications. Denies chest pain, shortness of breath or headache. Reviewed last CMP and updated labs as indicated. Reviewed medications and refills provided if needed    HLD- Continue with current medication.  Due for recheck of lipid levels.    Anxiety- Continue with Prozac as prescribed. Does have substance abuse history and history of anxiety and depression.    Return for nurse visit prevnar 20 next week; 3 mo f/u DM2 (labs prior) with Stacy .    60454 Billed for moderate complexity with uncontrolled chronic condition, change in medications, orders placed for further evaluation.        SUBJECTIVE/OBJECTIVE:  HPI    Harry Mitchell is a 52 y.o. male  that presents for follow-up.  HTN- Currently taking HCTZ and Losartan for BP control.  Denies any s/e of use.  BP elevated initially.  Does not check at home.  No c/o chest pain, SOB, or HA.    HLD- Currently taking 40 mg atorvastatin. No medication s/e.    Anxiety/depression- Currently taking 40 Prozac.  Patient reports he has been on same medication for a few years.  He inadvertently stopped medication and realized how much it did help with depression symptoms.      DM2-  Recent A1C 13.4.  He is currently taking Metformin 1000mg  BID and 45 units of lantus in the morning.  Increased dose of metformin to twice daily - would occasionally fall asleep and forget to medication but has been doing much better since labs drawn 2 weeks ago.  Has been checking sugars recently 107- 150.         Foot pain - bilat, equal. No injury. Feels burning/ pins and needles as well as numbness. Denies leg /calf cramping. Denies cold to touch. Is on feet all day working for [employer].     Patient with history of substance abuse.  Has been clean for a few months and moved to [facility] to get his life back on track.  He is currently working hard in a greenhouse, labor intensive work.    Immunization History   Administered Date(s) Administered    COVID-19, MODERNA BLUE border, Primary or Immunocompromised, (age 12y+), IM, 100 mcg/0.65mL 11/08/2019    Influenza Virus Vaccine 12/03/2019, 12/11/2020    Influenza, FLUARIX, FLULAVAL, FLUZONE (age 12 mo+) AND AFLURIA, (age 58 y+), PF, 0.21mL 03/31/2022    PPD Test 07/12/2011    Pneumococcal, PPSV23, PNEUMOVAX 23, (age 2y+), SC/IM, 0.34mL 08/24/2010    TD 2LF, TDVAX, (age 7y+), IM, 0.31mL 05/04/2006    TDaP, ADACEL (age 33y-64y), Leda Min (age 10y+), IM, 0.30mL 05/30/2017    Zoster Recombinant (Shingrix) 12/11/2020           Current Outpatient Medications   Medication Sig Dispense Refill    sildenafil (REVATIO) 20 MG tablet Take 1 tablet by mouth daily as needed (ED; take 30 min prior to sexual activity) 10 tablet 3    metFORMIN (GLUCOPHAGE-XR) 500 MG extended release tablet Take 2 tablets by mouth with breakfast and with evening meal 360 tablet 1    losartan (COZAAR) 100 MG tablet Take 1 tablet by mouth daily 90 tablet 1    insulin glargine (LANTUS SOLOSTAR) 100 UNIT/ML injection pen Inject 45 Units into the skin nightly Patient to inject 45-50 units into skin nightly 5 Adjustable Dose Pre-filled Pen Syringe 2    hydroCHLOROthiazide (HYDRODIURIL) 25 MG tablet Take 1 tablet by mouth daily 90 tablet 1    FLUoxetine (PROZAC) 40 MG capsule Take 1 capsule by mouth daily 90 capsule 1    atorvastatin (LIPITOR) 40 MG tablet Take 1 tablet by mouth daily 90 tablet 1    empagliflozin (JARDIANCE) 25 MG tablet Take 1 tablet by mouth daily 90 tablet 1     No current facility-administered  medications for this visit.        Prior to Admission medications    Medication Sig Start Date End Date Taking? Authorizing Provider   sildenafil (REVATIO) 20 MG tablet Take 1 tablet by mouth daily as needed (ED; take 30 min prior to sexual activity) 07/30/22  Yes Blossom Hoops, MD   metFORMIN (GLUCOPHAGE-XR) 500 MG extended release tablet Take 2 tablets by mouth with breakfast and with evening meal  07/30/22  Yes Blossom Hoops, MD   losartan (COZAAR) 100 MG tablet Take 1 tablet by mouth daily 07/30/22  Yes Blossom Hoops, MD   insulin glargine (LANTUS SOLOSTAR) 100 UNIT/ML injection pen Inject 45 Units into the skin nightly Patient to inject 45-50 units into skin nightly 07/30/22 08/29/22 Yes Blossom Hoops, MD   hydroCHLOROthiazide (HYDRODIURIL) 25 MG tablet Take 1 tablet by mouth daily 07/30/22 01/26/23 Yes Blossom Hoops, MD   FLUoxetine (PROZAC) 40 MG capsule Take 1 capsule by mouth daily 07/30/22  Yes Blossom Hoops, MD   atorvastatin (LIPITOR) 40 MG tablet Take 1 tablet by mouth daily 07/30/22 01/26/23 Yes Blossom Hoops, MD   empagliflozin (JARDIANCE) 25 MG tablet Take 1 tablet by mouth daily 07/30/22  Yes Blossom Hoops, MD       History reviewed. No pertinent past medical history.     No past surgical history on file.    REVIEW OF SYSTEMS:   ROS as noted in HPI, otherwise negative       BP 130/76   Pulse 79   Ht 1.753 m (5\' 9" )   Wt 81.6 kg (180 lb)   SpO2 97%   BMI 26.58 kg/m      PHYSICAL EXAM:  General appearance - alert, well appearing, and in no distress  Neck - normal ROM, no cervical LAD, no thyroid enlargement or nodularity  Mouth - mucous membranes moist, pharynx normal without lesions, missing teeth  Chest - clear to auscultation, no wheezes, normal work of breathing  Heart - normal rate and regular rhythm, no murmurs noted  Neurological - alert, normal speech, no focal findings  Extremities - peripheral pulses normal, no edema  Skin - normal coloration and turgor, no rashes  FEET - done 03/31/22:    Visual inspection:  Deformity/amputation: absent  Skin lesions/pre-ulcerative calluses: present - bilat feet at first mcp joint   Edema: right- negative, left- negative    Sensory exam:  Monofilament sensation: normal  (minimum of 5 random plantar locations tested, avoiding callused areas - > 1 area with absence of sensation is + for neuropathy)    Plus at least one of the following:  Pulses: normal,   Pinprick: N/A  Proprioception: N/A  Vibration (128 Hz): N/A         No results found for this visit on 07/30/22.         Results for orders placed or performed in visit on 07/16/22 (from the past 2160 hour(s))   Hemoglobin A1C   Result Value Ref Range    Hemoglobin A1C 9.9 (H) 4.0 - 6.0 %    Estimated Avg Glucose 237     Estimated Avg Glucose 275    Comprehensive Metabolic Panel   Result Value Ref Range    Sodium 138 135 - 145 mmol/L    Potassium 4.8 3.5 - 5.3 mmol/L    Chloride 98 98 - 107 mmol/L    CO2 30 (H) 22 - 29 mmol/L    Glucose 201 (H) 70 - 99 mg/dL    BUN 25 (H) 6 - 20 mg/dL    Creatinine 1.1 0.7 - 1.3 mg/dL    Anion Gap 10 2 - 17 mmol/L    Osmolaliy Calculated 286 270 - 287 mOsm/kg    Calcium 10.0 8.5 - 10.7 mg/dL    Total Protein 6.7 5.7 - 8.3 g/dL    Albumin 4.6 3.5 - 5.2 g/dL    Globulin 2.1 1.9 -  4.4 g/dL    Albumin/Globulin Ratio 2.20 1.00 - 2.70    Total Bilirubin 0.30 0.00 - 1.20 mg/dL    Alk Phosphatase 90 40 - 130 unit/L    AST 21 0 - 50 unit/L    ALT 20 0 - 50 unit/L    Est, Glom Filt Rate 80 >=60 mL/min/1.84m           An electronic signature was used to authenticate this note.    --Blossom Hoops, MD

## 2022-08-11 ENCOUNTER — Encounter

## 2022-08-11 NOTE — Telephone Encounter (Signed)
Last visit 07/30/22 and next scheduled 11/04/22.  Patient would like sent to [facility]. Checked with [facility] due to on our records Rx sent on 07/30/22 for 90 tabs with 1 refill. Patient should have Rx. Per pharm that Rx was discontinue per MD. This writer explained that it looks like on our end should have refill but pharm said it was dc.

## 2022-08-11 NOTE — Telephone Encounter (Signed)
Patient is requesting a refill for jardiance 25mg  and wants to use [facility].

## 2022-08-12 ENCOUNTER — Ambulatory Visit
Admit: 2022-08-12 | Discharge: 2022-08-12 | Payer: PRIVATE HEALTH INSURANCE | Attending: Family | Primary: Family Medicine

## 2022-08-12 DIAGNOSIS — R051 Acute cough: Secondary | ICD-10-CM

## 2022-08-12 MED ORDER — ALBUTEROL SULFATE HFA 108 (90 BASE) MCG/ACT IN AERS
108 (90 Base) MCG/ACT | Freq: Four times a day (QID) | RESPIRATORY_TRACT | 0 refills | Status: DC | PRN
Start: 2022-08-12 — End: 2023-02-09

## 2022-08-12 MED ORDER — AZITHROMYCIN 250 MG PO TABS
250 MG | ORAL_TABLET | ORAL | 0 refills | Status: AC
Start: 2022-08-12 — End: 2022-08-22

## 2022-08-12 MED ORDER — EMPAGLIFLOZIN 25 MG PO TABS
25 MG | ORAL_TABLET | Freq: Every day | ORAL | 1 refills | Status: DC
Start: 2022-08-12 — End: 2023-02-09

## 2022-08-12 MED ORDER — BENZONATATE 200 MG PO CAPS
200 MG | ORAL_CAPSULE | Freq: Three times a day (TID) | ORAL | 0 refills | Status: AC | PRN
Start: 2022-08-12 — End: 2022-08-19

## 2022-08-12 NOTE — Progress Notes (Signed)
CHIEF COMPLAINT:  Chief Complaint   Patient presents with    Other     Cough, congestion, shortness of breath x 2-3 days.         HISTORY OF PRESENT ILLNESS:  Harry Mitchell is a 52 y.o. male  who presents to the clinic with a few days of acute URI symptoms that include cough, congestion, and mild SOB. He reports that he has been hospitalized in the past due to PNA and/or bronchitis.  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 He has not tried anything OTC for cold symptoms.  Patient reports blood sugars have been stable.  He denies any fever, ear pain, sore throat, facial pain, or N/V/D.  Cough is mostly nonproductive and he reports intermittent wheezing.    CURRENT MEDICATION LIST:    Current Outpatient Medications   Medication Sig Dispense Refill    empagliflozin (JARDIANCE) 25 MG tablet Take 1 tablet by mouth daily 90 tablet 1    benzonatate (TESSALON) 200 MG capsule Take 1 capsule by mouth 3 times daily as needed for Cough 21 capsule 0    albuterol sulfate HFA (VENTOLIN HFA) 108 (90 Base) MCG/ACT inhaler Inhale 2 puffs into the lungs 4 times daily as needed for Wheezing 18 g 0    azithromycin (ZITHROMAX) 250 MG tablet 500mg  on day 1 followed by 250mg  on days 2 - 5 6 tablet 0    sildenafil (REVATIO) 20 MG tablet Take 1 tablet by mouth daily as needed (ED; take 30 min prior to sexual activity) 10 tablet 3    metFORMIN (GLUCOPHAGE-XR) 500 MG extended release tablet Take 2 tablets by mouth with breakfast and with evening meal 360 tablet 1    losartan (COZAAR) 100 MG tablet Take 1 tablet by mouth daily 90 tablet 1    insulin glargine (LANTUS SOLOSTAR) 100 UNIT/ML injection pen Inject 45 Units into the skin nightly Patient to inject 45-50 units into skin nightly 5 Adjustable Dose Pre-filled Pen Syringe 2    hydroCHLOROthiazide (HYDRODIURIL) 25 MG tablet Take 1 tablet by mouth daily 90 tablet 1    FLUoxetine (PROZAC) 40 MG capsule Take 1 capsule by mouth daily 90 capsule 1     atorvastatin (LIPITOR) 40 MG tablet Take 1 tablet by mouth daily 90 tablet 1     No current facility-administered medications for this visit.        ALLERGIES:    Allergies   Allergen Reactions    Gabapentin Other (See Comments)     Other reaction(s): GI Upset (intolerance)  Drowsiness  Drowsiness      Sulfa Antibiotics      REACTION: pt. can't remember reaction        HISTORY:  Past Medical History:   Diagnosis Date    Hyperlipidemia     Hypertension     Type 2 diabetes mellitus without complication (HCC)       History reviewed. No pertinent surgical history.   Social History     Socioeconomic History    Marital status: Single     Spouse name: Not on file    Number of children: Not on file    Years of education: Not on file    Highest education level: Not on file   Occupational History    Not on file   Tobacco Use    Smoking status: Some Days     Types: Cigarettes    Smokeless tobacco: Never  Substance and Sexual Activity    Alcohol use: Not Currently    Drug use: Not Currently     Types: Methamphetamines (Crystal Meth)    Sexual activity: Not on file   Other Topics Concern    Not on file   Social History Narrative    Not on file     Social Determinants of Health     Financial Resource Strain: Not on file   Food Insecurity: Not on file   Transportation Needs: Not on file   Physical Activity: Not on file   Stress: Not on file   Social Connections: Not on file   Intimate Partner Violence: Not on file   Housing Stability: Not on file      History reviewed. No pertinent family history.     REVIEW OF SYSTEMS:  Review of Systems   Constitutional:  Positive for chills and fatigue.   HENT:  Positive for congestion and rhinorrhea.    Respiratory:  Positive for cough, shortness of breath and wheezing.    All other systems reviewed and are negative.        PHYSICAL EXAM:  Physical Exam  Vitals and nursing note reviewed.   Constitutional:       Appearance: He is ill-appearing.   HENT:      Head: Normocephalic and atraumatic.       Right Ear: Tympanic membrane, ear canal and external ear normal.      Left Ear: Tympanic membrane, ear canal and external ear normal.      Nose: Nose normal.      Mouth/Throat:      Pharynx: Posterior oropharyngeal erythema present.   Eyes:      Extraocular Movements: Extraocular movements intact.      Pupils: Pupils are equal, round, and reactive to light.   Cardiovascular:      Rate and Rhythm: Normal rate and regular rhythm.      Pulses: Normal pulses.      Heart sounds: Normal heart sounds.   Pulmonary:      Effort: Pulmonary effort is normal.      Breath sounds: Normal breath sounds.      Comments: Persistent non-productive cough  Musculoskeletal:      Cervical back: Normal range of motion.   Lymphadenopathy:      Cervical: No cervical adenopathy.   Skin:     General: Skin is warm and dry.   Neurological:      Mental Status: He is alert.   Psychiatric:         Mood and Affect: Mood normal.         Behavior: Behavior normal.            Vital Signs -   Vitals:    08/12/22 1431   BP: (!) 144/82   Pulse: 76   Temp: 98 F (36.7 C)   SpO2: 97%   Weight: 81.6 kg (180 lb)            LABS  No results found for this visit on 08/12/22.  Nurse Only on 07/16/2022   Component Date Value Ref Range Status    Hemoglobin A1C 07/16/2022 9.9 (H)  4.0 - 6.0 % Final    Comment: HEMOGLOBIN A1C INTERPRETATION:    The following arbitrary ranges may be used for interpretation of the results.  However, factors such as duration of diabetes, adherence to therapy, and  patient age should also be considered in assessing degree of blood glucose  control.    Hemoglobin A1C                 Avg. Blood Sugar  --------------------------------------------------------------  6%                           135 mg/dL  7%                           170 mg/dL  8%                           205 mg/dL  9%                           240 mg/dL  16%                          275 mg/dL    ======================================================    A1C                       Glucose Control  ----------------------------------------------------------------  < 6.0 %                   Normal  6.0 - 6.9 %               Abnormal  7.0 - 7.9 %               Sub-Optimal Control  > 8.0 %                   Inadequate Control      Estimated Avg Glucose 07/16/2022 237   Final    Estimated Avg Glucose 07/16/2022 275   Final    Sodium 07/16/2022 138  135 - 145 mmol/L Final    Potassium 07/16/2022 4.8  3.5 - 5.3 mmol/L Final    Chloride 07/16/2022 98  98 - 107 mmol/L Final    CO2 07/16/2022 30 (H)  22 - 29 mmol/L Final    Glucose 07/16/2022 201 (H)  70 - 99 mg/dL Final    BUN 10/96/0454 25 (H)  6 - 20 mg/dL Final    Creatinine 09/81/1914 1.1  0.7 - 1.3 mg/dL Final    Anion Gap 78/29/5621 10  2 - 17 mmol/L Final    Osmolaliy Calculated 07/16/2022 286  270 - 287 mOsm/kg Final    Calcium 07/16/2022 10.0  8.5 - 10.7 mg/dL Final    Total Protein 07/16/2022 6.7  5.7 - 8.3 g/dL Final    Albumin 30/86/5784 4.6  3.5 - 5.2 g/dL Final    Globulin 69/62/9528 2.1  1.9 - 4.4 g/dL Final    Albumin/Globulin Ratio 07/16/2022 2.20  1.00 - 2.70 Final    Total Bilirubin 07/16/2022 0.30  0.00 - 1.20 mg/dL Final    Alk Phosphatase 07/16/2022 90  40 - 130 unit/L Final    AST 07/16/2022 21  0 - 50 unit/L Final    ALT 07/16/2022 20  0 - 50 unit/L Final    Est, Glom Filt Rate 07/16/2022 80  >=60 mL/min/1.33m Final    Comment: VERIFIED by Discern Expert.  GFR Interpretation:                                                                         %  OF  KIDNEY  GFR                                                        STAGE  FUNCTION  ==================================================================================    > 90        Normal kidney function                       STAGE 1  90-100%  89 to 60      Mild loss of kidney function                 STAGE 2  80-60%  59 to 45      Mild to moderate loss of kidney function     STAGE 3a  59-45%  44 to 30      Moderate to severe loss of kidney function   STAGE  3b  44-30%  29 to 15      Severe loss of kidney function               STAGE 4  29-15%    < 15        Kidney failure                               STAGE 5  <15%  ==================================================================================  Modified from [employer]    GFR Calculation performed using the CKD-EPI 2021 equation developed for use  with IDMS traceable creatinine methods and                            is the calculation recommended by  the [facility] for estimating GFR in adults.         IMPRESSION/PLAN        Arianna was seen today for other.    Diagnoses and all orders for this visit:    Acute cough  -     benzonatate (TESSALON) 200 MG capsule; Take 1 capsule by mouth 3 times daily as needed for Cough  -     albuterol sulfate HFA (VENTOLIN HFA) 108 (90 Base) MCG/ACT inhaler; Inhale 2 puffs into the lungs 4 times daily as needed for Wheezing  -     azithromycin (ZITHROMAX) 250 MG tablet; 500mg  on day 1 followed by 250mg  on days 2 - 5       Plan-we will treat with Tessalon Perles and albuterol inhaler.  Will hold off on oral steroids at this point due to uncontrolled diabetes.  Patient was given Z-Pak prophylactically given PMH of PNA.  No indications for x-ray on visit today, but needs to consider if symptoms do not improve over the next 48-72 hours.  Patient given work excuse for tomorrow and to rest through the weekend.  He was encouraged to continue to check his blood sugars and give insulin as needed.  Additionally he needs to increase hydration and rest.    Follow up and Dispositions:  Return if symptoms worsen or fail to improve.       Kenesha Moshier A Eduin Friedel, APRN - NP

## 2022-10-11 ENCOUNTER — Ambulatory Visit
Admit: 2022-10-11 | Discharge: 2022-10-11 | Payer: PRIVATE HEALTH INSURANCE | Attending: Family | Primary: Family Medicine

## 2022-10-11 DIAGNOSIS — S60521A Blister (nonthermal) of right hand, initial encounter: Secondary | ICD-10-CM

## 2022-10-11 MED ORDER — CEPHALEXIN 500 MG PO CAPS
500 MG | ORAL_CAPSULE | Freq: Four times a day (QID) | ORAL | 0 refills | Status: DC
Start: 2022-10-11 — End: 2023-02-09
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
## 2022-10-22 ENCOUNTER — Telehealth

## 2022-10-22 ENCOUNTER — Encounter

## 2022-10-22 NOTE — Telephone Encounter (Signed)
Patient is requesting a refil for sildenafil 20mg  and wants to use [facility].

## 2022-10-22 NOTE — Telephone Encounter (Signed)
Last visit 08-12-22   Next 11-04-22   Last refill 07-30-22 10 tabs refills 3    Requested Prescriptions     Pending Prescriptions Disp Refills    sildenafil (REVATIO) 20 MG tablet 10 tablet 3     Sig: Take 1 tablet by mouth daily as needed (ED; take 30 min prior to sexual activity)

## 2022-10-25 MED ORDER — SILDENAFIL CITRATE 20 MG PO TABS
20 MG | ORAL_TABLET | Freq: Every day | ORAL | 3 refills | Status: DC | PRN
Start: 2022-10-25 — End: 2023-02-09

## 2022-11-01 ENCOUNTER — Other Ambulatory Visit: Admit: 2022-11-01 | Discharge: 2022-11-01 | Payer: PRIVATE HEALTH INSURANCE | Primary: Family Medicine

## 2022-11-01 DIAGNOSIS — Z1159 Encounter for screening for other viral diseases: Secondary | ICD-10-CM

## 2022-11-01 LAB — ALBUMIN/CREATININE RATIO, URINE
Albumin Urine: 9.9 mg/dL (ref 0.0–20.0)
Albumin/Creatinine Ratio: 206 mg/g{creat} — ABNORMAL HIGH (ref 0–30)
Creatinine, Ur: 48 mg/dL (ref 39.0–259.0)

## 2022-11-01 LAB — COMPREHENSIVE METABOLIC PANEL
ALT: 27 U/L (ref 0–50)
AST: 23 U/L (ref 0–50)
Albumin/Globulin Ratio: 1.6 (ref 1.00–2.70)
Albumin: 4.2 g/dL (ref 3.5–5.2)
Alk Phosphatase: 86 U/L (ref 40–130)
Anion Gap: 12 mmol/L (ref 2–17)
BUN: 28 mg/dL — ABNORMAL HIGH (ref 6–20)
CO2: 27 mmol/L (ref 22–29)
Calcium: 10 mg/dL (ref 8.5–10.7)
Chloride: 99 mmol/L (ref 98–107)
Creatinine: 1.1 mg/dL (ref 0.7–1.3)
Est, Glom Filt Rate: 80 mL/min/1.73mÂ² (ref >=60)
Globulin: 2.6 g/dL (ref 1.9–4.4)
Glucose: 119 mg/dL — ABNORMAL HIGH (ref 70–99)
Osmolaliy Calculated: 282 mosm/kg (ref 270–287)
Potassium: 4.2 mmol/L (ref 3.5–5.3)
Sodium: 138 mmol/L (ref 135–145)
Total Bilirubin: <0.15 mg/dL (ref 0.00–1.20)
Total Protein: 6.8 g/dL (ref 5.7–8.3)

## 2022-11-01 LAB — CBC
Hematocrit: 42.1 % (ref 38.0–52.0)
Hemoglobin: 14 g/dL (ref 13.0–17.3)
MCH: 28.9 pg (ref 27.0–34.5)
MCHC: 33.3 g/dL (ref 30.0–36.0)
MCV: 86.8 fL (ref 84.0–100.0)
MPV: 10.4 fL (ref 7.0–12.2)
NRBC Absolute: 0 10*3/uL (ref 0.000–0.012)
NRBC Automated: 0 % (ref 0.0–0.2)
Platelets: 298 10*3/uL (ref 140–440)
RBC: 4.85 x10e6/mcL (ref 4.00–5.60)
RDW: 13.4 % (ref 10.0–17.0)
WBC: 7.4 10*3/uL (ref 3.8–10.6)

## 2022-11-01 LAB — HEMOGLOBIN A1C
Estimated Avg Glucose: 258
Estimated Avg Glucose: 300
Hemoglobin A1C: 10.6 % — ABNORMAL HIGH (ref 4.0–6.0)

## 2022-11-01 LAB — HIV SCREEN: HIV Screen: NEGATIVE

## 2022-11-01 LAB — HEPATITIS C ANTIBODY: Hepatitis C Ab: NEGATIVE

## 2022-11-01 LAB — PSA SCREENING: PSA, Screening: 2.02 ng/mL (ref 0.000–4.000)

## 2022-11-02 ENCOUNTER — Ambulatory Visit
Admit: 2022-11-02 | Discharge: 2022-11-02 | Payer: PRIVATE HEALTH INSURANCE | Attending: Family | Primary: Family Medicine

## 2022-11-02 VITALS — BP 133/77 | HR 80 | Ht 69.0 in | Wt 178.0 lb

## 2022-11-02 DIAGNOSIS — Z794 Long term (current) use of insulin: Secondary | ICD-10-CM

## 2022-11-02 NOTE — Progress Notes (Signed)
 Harry Mitchell [place] (DOB:  Feb 04, 1971) is a 52 y.o. male,Established patient, here for evaluation of the following chief complaint(s):  3 Month Follow-Up (Needs a referral to an eye dr. )      ASSESSMENT/PLAN:      ICD-10-CM    1. Type 2 diabetes mellitus with hyperglycemia, with long-term current use of insulin  [facility])  E11.65 [facility] - Tobie Camilo SAUNDERS, DO, Endocrinology, [address]    RENFORD Mitchell     Hemoglobin A1C      2. Mixed hyperlipidemia  E78.2       3. History of drug abuse in remission (HCC)  F19.11       4. Essential hypertension, benign  I10       5. Abscess  L02.91           Diabetes - UNCONTROLLED, A1C above goal of 7 despite taking medication though improving.  Discussed need for compliance with medication, reviewed how medication is taken. Discussed moving lantus  to afternoon after shower, to make more routine and better adherence.  Referral placed to endocrine and labs ordered to repeat in 3 months.  Patient to continue with metformin  1000mg  BID, jardiance  25mg  daily, lantus  40U evening.    Hypertension - Blood pressure overall controlled. Denies side effects from medications. Denies chest pain, shortness of breath or headache. Reviewed last CMP and updated labs as indicated. Reviewed medications and refills provided if needed    HLD- Continue atorvastatin  40mg      Anxiety- Continue with Prozac  as prescribed. Does have substance abuse history and history of anxiety and depression.    Abscess-patient currently taking Keflex  and has approximately a week left.  Area very firm and unlikely to drain.  Patient needs to stop applying pressure and attempt to squeeze fluid out of the area.  He can use warm compresses to help.  RTC if it does not improve in the next week.    Return in about 3 months (around 02/02/2023) for w/ Dr Francella- DM.    Spent about 35 minutes reviewing records, discussing chronic conditions/symptoms, examining/evaluating patient, providing  counseling/recommendations, placing orders/ medication and completing documentation day of service.     SUBJECTIVE/OBJECTIVE:  HPI    Harry Mitchell is a 52 y.o. male  that presents for follow-up.     HTN- Currently taking HCTZ and Losartan  for BP control.  Denies any s/e of use.  BP controlled. Does not check at home.  No c/o chest pain, SOB, or HA.    HLD- Currently taking 40 mg atorvastatin . No medication s/e.    Anxiety/depression- Currently taking 40 Prozac .  Patient reports he has been on same medication for a few years.  He inadvertently stopped medication and realized how much it did help with depression symptoms.  Stable.     DM2-  Recent A1C slightly worse at 10.6, from 9.9 3 months ago.  He is currently taking Metformin  1000mg  BID and 40 units of lantus  at night (went down on his own from 45U).  Jardiance  increased to 25 mg last visit. Has been checking sugars and usually around 150.  Patient admits to missing many doses of lantus  weekly, likely only getting 3 injections a week.  He has increased urination overnight when he misses a dose.     Blisters to hand have healed well, skin has formed back over.  No significant pain.  Taking antibiotic.    Small hard abscess to left chest for the last few days.  He  has squeezed and had purulent draining coming out.  It is red and tender.    Patient with history of substance abuse.  Has been clean since moving to [facility] to get his life back on track.  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    Immunization History   Administered Date(s) Administered    COVID-19, MODERNA BLUE border, Primary or Immunocompromised, (age 12y+), IM, 100 mcg/0.5mL 11/08/2019    Influenza Virus Vaccine 12/03/2019, 12/11/2020    Influenza, FLUARIX, FLULAVAL, FLUZONE (age 48 mo+) and AFLURIA, (age 4 y+), Quadv PF, 0.5mL 03/31/2022    PPD Test 07/12/2011    Pneumococcal, PPSV23, PNEUMOVAX 23, (age 2y+), SC/IM, 0.5mL 08/24/2010    TD 2LF, TDVAX, (age 7y+),  IM, 0.5mL 05/04/2006    TDaP, ADACEL (age 42y-64y), MYRTICE (age 10y+), IM, 0.5mL 05/30/2017    Zoster Recombinant (Shingrix) 12/11/2020           Current Outpatient Medications   Medication Sig Dispense Refill    sildenafil  (REVATIO ) 20 MG tablet Take 1 tablet by mouth daily as needed (ED; take 30 min prior to sexual activity) 10 tablet 3    cephALEXin  (KEFLEX ) 500 MG capsule Take 1 capsule by mouth 4 times daily 40 capsule 0    empagliflozin  (JARDIANCE ) 25 MG tablet Take 1 tablet by mouth daily 90 tablet 1    albuterol  sulfate HFA (VENTOLIN  HFA) 108 (90 Base) MCG/ACT inhaler Inhale 2 puffs into the lungs 4 times daily as needed for Wheezing 18 g 0    metFORMIN  (GLUCOPHAGE -XR) 500 MG extended release tablet Take 2 tablets by mouth with breakfast and with evening meal 360 tablet 1    losartan  (COZAAR ) 100 MG tablet Take 1 tablet by mouth daily 90 tablet 1    insulin  glargine (LANTUS  SOLOSTAR) 100 UNIT/ML injection pen Inject 45 Units into the skin nightly Patient to inject 45-50 units into skin nightly 5 Adjustable Dose Pre-filled Pen Syringe 2    hydroCHLOROthiazide  (HYDRODIURIL ) 25 MG tablet Take 1 tablet by mouth daily 90 tablet 1    FLUoxetine  (PROZAC ) 40 MG capsule Take 1 capsule by mouth daily 90 capsule 1    atorvastatin  (LIPITOR) 40 MG tablet Take 1 tablet by mouth daily 90 tablet 1     No current facility-administered medications for this visit.        Prior to Admission medications    Medication Sig Start Date End Date Taking? Authorizing Provider   sildenafil  (REVATIO ) 20 MG tablet Take 1 tablet by mouth daily as needed (ED; take 30 min prior to sexual activity) 10/25/22   Kennith Heron SQUIBB, MD   cephALEXin  (KEFLEX ) 500 MG capsule Take 1 capsule by mouth 4 times daily 10/11/22   Rhoda Lynwood ORN, APRN - NP   empagliflozin  (JARDIANCE ) 25 MG tablet Take 1 tablet by mouth daily 08/12/22   Jarrel Knoke, Glade LABOR, APRN - NP   albuterol  sulfate HFA (VENTOLIN  HFA) 108 (90 Base) MCG/ACT inhaler Inhale 2 puffs into the  lungs 4 times daily as needed for Wheezing 08/12/22   Ollis Daudelin A, APRN - NP   metFORMIN  (GLUCOPHAGE -XR) 500 MG extended release tablet Take 2 tablets by mouth with breakfast and with evening meal 07/30/22   [name], MD   losartan  (COZAAR ) 100 MG tablet Take 1 tablet by mouth daily 07/30/22   [name], MD   insulin  glargine (LANTUS  SOLOSTAR) 100 UNIT/ML injection pen Inject 45 Units into the skin nightly Patient  to inject 45-50 units into skin nightly 07/30/22 08/29/22  [name], MD   hydroCHLOROthiazide  (HYDRODIURIL ) 25 MG tablet Take 1 tablet by mouth daily 07/30/22 01/26/23  [name], MD   FLUoxetine  (PROZAC ) 40 MG capsule Take 1 capsule by mouth daily 07/30/22   [name], MD   atorvastatin  (LIPITOR) 40 MG tablet Take 1 tablet by mouth daily 07/30/22 01/26/23  [name], MD       Past Medical History:   Diagnosis Date    Hyperlipidemia     Hypertension     Type 2 diabetes mellitus without complication [facility])         History reviewed. No pertinent surgical history.    REVIEW OF SYSTEMS:   ROS as noted in HPI, otherwise negative       BP 133/77   Pulse 80   Ht 1.753 m (5' 9)   Wt 80.7 kg (178 lb)   SpO2 97%   BMI 26.29 kg/m      PHYSICAL EXAM:  General appearance - alert, well appearing, and in no distress  Neck - normal ROM, no cervical LAD, no thyroid enlargement or nodularity  Mouth - mucous membranes moist, pharynx normal without lesions, missing teeth  Chest - clear to auscultation, no wheezes, normal work of breathing  Heart - normal rate and regular rhythm, no murmurs noted  Neurological - alert, normal speech, no focal findings  Extremities - peripheral pulses normal, no edema  Skin - blisters to hand with good skin coverage and no erythema, left chest with 2 cm hard red abscess, no drainage       No results found for this visit on 11/02/22.         Results for orders placed or performed in visit on 11/01/22 (from the past 2160 hour(s))   HIV Screen   Result Value  Ref Range    HIV Screen Negative Negative   Hepatitis C Antibody   Result Value Ref Range    Hepatitis C Ab Negative Negative   PSA Screening   Result Value Ref Range    PSA, Screening 2.020 0.000 - 4.000 ng/mL   Microalbumin / Creatinine Urine Ratio   Result Value Ref Range    Microalb, Ur 9.9 0.0 - 20.0 mg/dL    Creatinine, Ur 51.9 39.0 - 259.0 mg/dL    Microalb/Creat Ratio 206 (H) 0 - 30 mg/g Cr   CBC   Result Value Ref Range    WBC 7.4 3.8 - 10.6 x10e3/mcL    RBC 4.85 4.00 - 5.60 x10e6/mcL    Hemoglobin 14.0 13.0 - 17.3 g/dL    Hematocrit 57.8 61.9 - 52.0 %    MCV 86.8 84.0 - 100.0 fL    MCH 28.9 27.0 - 34.5 pg    MCHC 33.3 30.0 - 36.0 g/dL    RDW 86.5 89.9 - 82.9 %    Platelets 298 140 - 440 x10e3/mcL    MPV 10.4 7.0 - 12.2 fL    NRBC Automated 0.0 0.0 - 0.2 %    NRBC Absolute 0.000 0.000 - 0.012 x10e3/mcL   Comprehensive Metabolic Panel   Result Value Ref Range    Sodium 138 135 - 145 mmol/L    Potassium 4.2 3.5 - 5.3 mmol/L    Chloride 99 98 - 107 mmol/L    CO2 27 22 - 29 mmol/L    Glucose 119 (H) 70 - 99 mg/dL    BUN 28 (H) 6 -  20 mg/dL    Creatinine 1.1 0.7 - 1.3 mg/dL    Anion Gap 12 2 - 17 mmol/L    Osmolaliy Calculated 282 270 - 287 mOsm/kg    Calcium  10.0 8.5 - 10.7 mg/dL    Total Protein 6.8 5.7 - 8.3 g/dL    Albumin 4.2 3.5 - 5.2 g/dL    Globulin 2.6 1.9 - 4.4 g/dL    Albumin/Globulin Ratio 1.60 1.00 - 2.70    Total Bilirubin <0.15 0.00 - 1.20 mg/dL    Alk Phosphatase 86 40 - 130 unit/L    AST 23 0 - 50 unit/L    ALT 27 0 - 50 unit/L    Est, Glom Filt Rate 80 >=60 mL/min/1.13m   Hemoglobin A1C   Result Value Ref Range    Hemoglobin A1C 10.6 (H) 4.0 - 6.0 %    Estimated Avg Glucose 258     Estimated Avg Glucose 300            An electronic signature was used to authenticate this note.    --Glade LABOR Diesel Lina, APRN - NP

## 2022-11-04 ENCOUNTER — Encounter: Payer: PRIVATE HEALTH INSURANCE | Attending: Family | Primary: Family Medicine

## 2022-12-06 ENCOUNTER — Encounter

## 2022-12-06 NOTE — Telephone Encounter (Signed)
 Requested Prescriptions     Pending Prescriptions Disp Refills    LANTUS  SOLOSTAR 100 UNIT/ML injection pen [Pharmacy Med Name: LANTUS  INJ SOLOSTAR] 15 mL 1     Sig: INJECT 45-50 UNITS INTO SKIN NIGHTLY AS DIRECTED    Medication loaded please es last visit 08/27 upcoming visit 02/2023

## 2022-12-07 MED ORDER — LANTUS SOLOSTAR 100 UNIT/ML SC SOPN
100 | SUBCUTANEOUS | 1 refills | Status: DC
Start: 2022-12-07 — End: 2023-02-09

## 2022-12-10 ENCOUNTER — Other Ambulatory Visit: Payer: Self-pay | Admitting: Family Medicine

## 2022-12-10 DIAGNOSIS — Z1211 Encounter for screening for malignant neoplasm of colon: Secondary | ICD-10-CM

## 2022-12-10 DIAGNOSIS — Z1212 Encounter for screening for malignant neoplasm of rectum: Secondary | ICD-10-CM

## 2023-01-10 ENCOUNTER — Telehealth

## 2023-01-10 NOTE — Telephone Encounter (Signed)
Cologuard ordered

## 2023-01-14 ENCOUNTER — Encounter
Admit: 2023-01-14 | Discharge: 2023-01-14 | Payer: PRIVATE HEALTH INSURANCE | Attending: Orthopaedic Surgery | Primary: Family Medicine

## 2023-01-14 ENCOUNTER — Inpatient Hospital Stay: Admit: 2023-01-14 | Payer: PRIVATE HEALTH INSURANCE | Primary: Family Medicine

## 2023-01-14 ENCOUNTER — Encounter

## 2023-01-14 VITALS — Ht 69.0 in | Wt 180.0 lb

## 2023-01-14 DIAGNOSIS — R52 Pain, unspecified: Secondary | ICD-10-CM

## 2023-01-14 DIAGNOSIS — M5126 Other intervertebral disc displacement, lumbar region: Secondary | ICD-10-CM

## 2023-01-14 MED ORDER — IBUPROFEN 800 MG PO TABS
800 MG | ORAL_TABLET | Freq: Three times a day (TID) | ORAL | 1 refills | Status: DC
Start: 2023-01-14 — End: 2023-02-09

## 2023-01-14 MED ORDER — GABAPENTIN 300 MG PO CAPS
300 MG | ORAL_CAPSULE | Freq: Two times a day (BID) | ORAL | 1 refills | Status: DC
Start: 2023-01-14 — End: 2023-02-09

## 2023-01-14 NOTE — Addendum Note (Signed)
Addended by: Jim Desanctis on: 01/14/2023 10:17 AM     Modules accepted: Orders

## 2023-01-14 NOTE — Progress Notes (Signed)
01/14/2023     Harry Mitchell    03-03-1971      52 y.o.    male   [address]  [place] [address]   Phone: [phone] (home)   PCP: Blossom Hoops, MD   Insurance: Payor: [employer] / Plan: [employer] / Product Type: *No Product type* /       Chief Complaint   Patient presents with    New Patient     Back  WC DOI:  12/13/2022  MR Lumbar  12/22/2022  [facility]   PACS          History of Present Illness:  Patient is a 52 year old male who is seen at this time for work-related injury that occurred 1 month ago October 7.  He works at the [employer].  He was lifting a plant dolly that weighed likely in excess of 50 pounds and twisted his upper body as he moved it and began having lower back pain as well as bilateral buttock and posterior thigh pain.  He underwent an MRI scan 10 days later.  He has been involved in physical therapy at [facility] and has had 5 visits with some improvement.    He is presently on limited duty status.  His job description states that he has to lift and carry up to 50 pounds.  His past history is significant for diabetes mellitus for which she is on several different medications.  His A1c's were 13.4 in January; 9.9 in mid May; and August 26 was 10.6.    Vitals: Ht 1.753 m (5\' 9" )   Wt 81.6 kg (180 lb)   BMI 26.58 kg/m      Current Outpatient Medications   Medication Sig Dispense Refill    LANTUS SOLOSTAR 100 UNIT/ML injection pen INJECT 45-50 UNITS INTO SKIN NIGHTLY AS DIRECTED 15 mL 1    sildenafil (REVATIO) 20 MG tablet Take 1 tablet by mouth daily as needed (ED; take 30 min prior to sexual activity) 10 tablet 3    cephALEXin (KEFLEX) 500 MG capsule Take 1 capsule by mouth 4 times daily 40 capsule 0    empagliflozin (JARDIANCE) 25 MG tablet Take 1 tablet by mouth daily 90 tablet 1    albuterol sulfate HFA (VENTOLIN HFA) 108 (90 Base) MCG/ACT inhaler Inhale 2 puffs into the lungs 4 times daily as needed for Wheezing 18 g 0     metFORMIN (GLUCOPHAGE-XR) 500 MG extended release tablet Take 2 tablets by mouth with breakfast and with evening meal 360 tablet 1    losartan (COZAAR) 100 MG tablet Take 1 tablet by mouth daily 90 tablet 1    hydroCHLOROthiazide (HYDRODIURIL) 25 MG tablet Take 1 tablet by mouth daily 90 tablet 1    FLUoxetine (PROZAC) 40 MG capsule Take 1 capsule by mouth daily 90 capsule 1    atorvastatin (LIPITOR) 40 MG tablet Take 1 tablet by mouth daily 90 tablet 1     No current facility-administered medications for this visit.        Allergies   Allergen Reactions    Sulfa Antibiotics      REACTION: pt. can't remember reaction        Social History     Tobacco Use    Smoking status: Former     Current packs/day: 0.00     Average packs/day: 1 pack/day for 14.3 years (14.3 ttl pk-yrs)     Types: Cigarettes  Start date: 2009     Quit date: 07/06/2021     Years since quitting: 1.5    Smokeless tobacco: Never   Substance Use Topics    Alcohol use: Not Currently    Drug use: Not Currently     Types: Methamphetamines (Crystal Meth)          General Examination:   Exam today reveals well-developed male in mild amount of discomfort.  He has some limited mobility of the lumbar spine in flexion as well as extension.  Reflexes are 1+ in the patella and Achilles tendons.  Sensory exam is intact in the L4-L5 and S1 dermatome distribution to light touch and pinprick.  Straight leg raise produces lower back pain.    The MRI scan reveals evidence of disc bulging present right side paracentral and foraminal L2-3 level; left sided posterior lateral and foraminal L3-4 level; and right paracentral and foraminal disc protrusion and osteophyte formation L4-5 level.    Diagnosis:     ICD-10-CM    1. 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      I will restart his physical therapy at  [facility] on [facility], recommending therapy 3 times per week for the next 5 weeks for lumbar stabilization program trunk strengthening and modalities as needed.    With uncontrolled diabetes mellitus, the patient is not a candidate for any injection treatment as this would further exacerbate his uncontrolled blood sugar issues.    I will prescribe Motrin as well as gabapentin.        Return in about 6 weeks (around 02/25/2023).      Electronically signed by Claudean Kinds, MD on 01/14/23 at 10:00 AM EST

## 2023-01-28 ENCOUNTER — Encounter: Admit: 2023-01-28 | Payer: PRIVATE HEALTH INSURANCE | Primary: Family Medicine

## 2023-01-28 DIAGNOSIS — Z794 Long term (current) use of insulin: Principal | ICD-10-CM

## 2023-01-28 DIAGNOSIS — E1165 Type 2 diabetes mellitus with hyperglycemia: Secondary | ICD-10-CM

## 2023-01-28 LAB — HEMOGLOBIN A1C
Estimated Avg Glucose: 169
Estimated Avg Glucose: 190
Hemoglobin A1C: 7.5 % — ABNORMAL HIGH (ref 4.0–6.0)

## 2023-01-28 NOTE — Progress Notes (Signed)
[id number]:  Verbal consent obtained; patient tolerated procedure well; site cleansed with aseptic technique; pressure applied.  Location: left arm  Technique: 21 g needle used

## 2023-01-29 LAB — C-PEPTIDE: C-Peptide: 0.8 ng/mL — ABNORMAL LOW (ref 1.1–4.4)

## 2023-02-09 ENCOUNTER — Encounter: Admit: 2023-02-09 | Payer: PRIVATE HEALTH INSURANCE | Admitting: Physician Assistant | Primary: Family Medicine

## 2023-02-09 ENCOUNTER — Encounter: Admit: 2023-02-09 | Payer: PRIVATE HEALTH INSURANCE | Admitting: Family Medicine | Primary: Family Medicine

## 2023-02-09 VITALS — BP 130/80 | HR 67 | Ht 69.02 in | Wt 194.0 lb

## 2023-02-09 DIAGNOSIS — Z794 Long term (current) use of insulin: Principal | ICD-10-CM

## 2023-02-09 DIAGNOSIS — E1165 Type 2 diabetes mellitus with hyperglycemia: Secondary | ICD-10-CM

## 2023-02-09 DIAGNOSIS — E118 Type 2 diabetes mellitus with unspecified complications: Principal | ICD-10-CM

## 2023-02-09 MED ORDER — HYDROCHLOROTHIAZIDE 25 MG PO TABS
25 MG | ORAL_TABLET | Freq: Every day | ORAL | 1 refills | Status: DC
Start: 2023-02-09 — End: 2023-08-10

## 2023-02-09 MED ORDER — METFORMIN HCL ER 500 MG PO TB24
500 MG | ORAL_TABLET | Freq: Two times a day (BID) | ORAL | 1 refills | Status: DC
Start: 2023-02-09 — End: 2023-08-10

## 2023-02-09 MED ORDER — LOSARTAN POTASSIUM 100 MG PO TABS
100 MG | ORAL_TABLET | Freq: Every day | ORAL | 1 refills | Status: DC
Start: 2023-02-09 — End: 2023-08-10

## 2023-02-09 MED ORDER — FLUOXETINE HCL 40 MG PO CAPS
40 MG | ORAL_CAPSULE | Freq: Every day | ORAL | 1 refills | Status: DC
Start: 2023-02-09 — End: 2023-08-10

## 2023-02-09 MED ORDER — FREESTYLE LIBRE 3 PLUS SENSOR MISC
1 refills | Status: DC
Start: 2023-02-09 — End: 2023-08-10

## 2023-02-09 MED ORDER — LANTUS SOLOSTAR 100 UNIT/ML SC SOPN
100 | Freq: Every day | SUBCUTANEOUS | 2 refills | 60.00 days | Status: DC
Start: 2023-02-09 — End: 2023-08-10

## 2023-02-09 MED ORDER — IBUPROFEN 800 MG PO TABS
800 | ORAL_TABLET | Freq: Three times a day (TID) | ORAL | 2 refills | 15.00000 days | Status: DC | PRN
Start: 2023-02-09 — End: 2024-02-23

## 2023-02-09 MED ORDER — GABAPENTIN 300 MG PO CAPS
300 MG | ORAL_CAPSULE | Freq: Two times a day (BID) | ORAL | 5 refills | Status: DC
Start: 2023-02-09 — End: 2023-08-10

## 2023-02-09 MED ORDER — EMPAGLIFLOZIN 25 MG PO TABS
25 | ORAL_TABLET | Freq: Every day | ORAL | 1 refills | 90.00000 days | Status: DC
Start: 2023-02-09 — End: 2023-07-06

## 2023-02-09 MED ORDER — ALBUTEROL SULFATE HFA 108 (90 BASE) MCG/ACT IN AERS
108 | Freq: Four times a day (QID) | RESPIRATORY_TRACT | 0 refills | Status: AC | PRN
Start: 2023-02-09 — End: ?

## 2023-02-09 MED ORDER — SILDENAFIL CITRATE 20 MG PO TABS
20 | ORAL_TABLET | Freq: Every day | ORAL | 3 refills | 30.00 days | Status: DC | PRN
Start: 2023-02-09 — End: 2023-08-10

## 2023-02-09 MED ORDER — ATORVASTATIN CALCIUM 40 MG PO TABS
40 | ORAL_TABLET | Freq: Every day | ORAL | 1 refills | 90.00 days | Status: DC
Start: 2023-02-09 — End: 2023-08-10

## 2023-02-09 NOTE — Progress Notes (Signed)
 Harry Mitchell [place] (DOB:  1970-09-22) is a 52 y.o. male,Established patient, here for evaluation of the following chief complaint(s):  Follow-up      ASSESSMENT/PLAN:      ICD-10-CM    1. Type 2 diabetes mellitus with complication (HCC)  E11.8 metFORMIN

## 2023-02-09 NOTE — Progress Notes (Signed)
 02/09/2023    TELEHEALTH EVALUATION -- Audio/Visual (During COVID-19 public health emergency)    History of Present Illness  The patient is a 52 year old male referred by his primary care provider for a new patient appointment for type 2 diabetes. He is seen in a virtual visit today.    He reports prior history of inconsistency with taking his insulin  injections, which prompted his primary care provider to refer him for further evaluation. His A1c level has decreased to 7.5. He was diagnosed with diabetes around 2013 and began treatment thereafter. He is on Lantus , taking 35 units daily, which has maintained his blood sugar levels within a normal range. He also takes metformin  XR 1000 mg twice daily and Jardiance  25 mg daily. He monitors his blood sugar levels every morning. Recently, he has noticed low blood sugar levels around 4:00 or 5:00 PM, ranging from 50 to 53, and has reduced his insulin  dosage accordingly.     He has a history of crystal meth addiction but reports no issues with alcoholism or liver or pancreas problems. He is currently sober and admits to occasional alcohol consumption, but not to the point of alcoholism. He has been clean for about 20 years.    For cholesterol management, he is on Lipitor 40 mg daily, and for blood pressure control, he takes Cozaar  100 mg daily.    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 He has not received any injections for this injury as his blood sugar levels need to be stable first. He is scheduled to see Dr. Tobias Forth on February 24, 2022.    Pt had a recent HbA1c and C Peptide test completed. C peptide not drawn with a glucose, but appears quite low at 0.7. I suspect he may not be making much endogenous insulin  at this point.      Prior to Visit Medications    Medication Sig Taking? Authorizing Provider   Continuous Glucose Sensor (FREESTYLE LIBRE 3 PLUS SENSOR) MISC Change  sensor every 15 days Yes [place], [place], [place]   gabapentin  (NEURONTIN ) 300 MG capsule Take 1 capsule by mouth 2 times daily for 60 days.  Aymond, James K, MD   ibuprofen  (ADVIL ;MOTRIN ) 800 MG tablet Take 1 tablet by mouth 3 times daily (with meals)  Aymond, Filiberto Hug, MD   LANTUS  SOLOSTAR 100 UNIT/ML injection pen INJECT 45-50 UNITS INTO SKIN NIGHTLY AS DIRECTED  Lenetta Quest, MD   sildenafil  (REVATIO ) 20 MG tablet Take 1 tablet by mouth daily as needed (ED; take 30 min prior to sexual activity)  Lenetta Quest, MD   empagliflozin  (JARDIANCE ) 25 MG tablet Take 1 tablet by mouth daily  Messersmith, Stacy A, APRN - NP   albuterol  sulfate HFA (VENTOLIN  HFA) 108 (90 Base) MCG/ACT inhaler Inhale 2 puffs into the lungs 4 times daily as needed for Wheezing  Messersmith, Stacy A, APRN - NP   metFORMIN  (GLUCOPHAGE -XR) 500 MG extended release tablet Take 2 tablets by mouth with breakfast and with evening meal  Daymon Evans, MD   losartan  (COZAAR ) 100 MG tablet Take 1 tablet by mouth daily  Daymon Evans, MD   hydroCHLOROthiazide  (HYDRODIURIL ) 25 MG tablet Take 1 tablet by mouth daily  Daymon Evans, MD   FLUoxetine  (PROZAC ) 40 MG capsule Take 1 capsule by mouth daily  Daymon Evans, MD   atorvastatin  (LIPITOR) 40 MG tablet Take 1 tablet  by mouth daily  Daymon Evans, MD     Social History     Tobacco Use    Smoking status: Former     Current packs/day: 0.00     Average packs/day: 1 pack/day for 14.3 years (14.3 ttl pk-yrs)     Types: Cigarettes     Start date: 2009     Quit date: 07/06/2021     Years since quitting: 1.5    Smokeless tobacco: Never   Substance Use Topics    Alcohol use: Not Currently    Drug use: Not Currently     Types: Methamphetamines (Crystal Meth)     History reviewed. No pertinent family history.  ROS: As per HPI, all others negative  There were no vitals taken for this visit.   PHYSICAL EXAMINATION:  Vital Signs: (As obtained by patient/caregiver or practitioner observation)      02/09/2023     11:33 AM   Patient-Reported Vitals   Patient-Reported Weight 180   Patient-Reported Height 68"   Patient-Reported Systolic 130 mmHg   Patient-Reported Diastolic 79 mmHg       Blood pressure-  Heart rate-    Respiratory rate-    Temperature-  Pulse oximetry-     Constitutional: [x]  Appears well-developed and well-nourished []  No apparent distress      []  Abnormal-   Mental status  [x]  Alert and awake  [x]  Oriented to person/place/time [x] Able to follow commands      Eyes:  EOM    [x]   Normal  []  Abnormal-  Sclera  [x]   Normal  []  Abnormal -         Discharge [x]   None visible  []  Abnormal -    HENT:   [x]  Normocephalic, atraumatic.  []  Abnormal   [x]  Mouth/Throat: Mucous membranes are moist.     External Ears [x]  Normal  []  Abnormal-     Neck: [x]  No visualized mass     Pulmonary/Chest: [x]  Respiratory effort normal.  [x]  No visualized signs of difficulty breathing or respiratory distress        []  Abnormal-      Musculoskeletal:   []  Normal gait with no signs of ataxia         [x]  Normal range of motion of neck        []  Abnormal-       Neurological:        [x]  No Facial Asymmetry (Cranial nerve 7 motor function) (limited exam to video visit)          [x]  No gaze palsy        []  Abnormal-         Skin:        [x]  No significant exanthematous lesions or discoloration noted on facial skin         []  Abnormal-            Psychiatric:       [x]  Normal Affect []  No Hallucinations        []  Abnormal-     Other pertinent observable physical exam findings-       Recent Results (from the past 672 hour(s))   Hemoglobin A1C    Collection Time: 01/28/23 10:01 AM   Result Value Ref Range    Hemoglobin A1C 7.5 (H) 4.0 - 6.0 %    Estimated Avg Glucose 169     Estimated Avg Glucose 190    C-Peptide    Collection Time: 01/28/23 10:01  AM   Result Value Ref Range    C-Peptide 0.8 (L) 1.1 - 4.4 ng/mL        Assessment & Plan  1. Type 2 Diabetes Mellitus.  His A1c level has shown significant improvement, dropping from over 10 percent to  7.5 percent. A C-peptide test conducted by his primary care physician revealed low insulin  production, suggesting a possible diagnosis of type 1 diabetes. A repeat C-peptide test will be conducted alongside a blood sugar test to confirm the diagnosis. An antibody test will also be conducted to check for any antibodies against the pancreas. He is advised to gradually reduce his insulin  dose by 2 to 3 units daily to prevent hypoglycemia. He should continue his current regimen of metformin  XR 1000 mg twice a day and Jardiance  25 mg daily. Blood work will be ordered, to be done when his blood sugar is around 150, to assess his insulin  production in response to higher blood sugar levels. A prescription for a FreeStyle Libre continuous glucose monitor will be sent to his pharmacy, [facility]. If C peptide is low in response to higher BS reading, he will be classified as Type 1 diabetic and we will need to continue with insulin  injections moving forward. He will likely be taken off Metformin  and Jardiance  at that time. We can consider novel insulin  delivery devices such as insulin  pumps in the future if necessary.    2. Back Pain.  The potential benefits of steroid injections for his back pain were discussed, along with the use of rapid-acting insulin  to manage potential blood sugar spikes if he receives a steroid injection. He is advised to inform the office if a steroid injection is recommended by his other healthcare provider.    3. Hyperlipidemia.  He is currently taking Lipitor 40 mg daily for cholesterol management. He should continue this medication.    4. Hypertension.  He is currently taking Cozaar  100 mg daily for blood pressure management. He should continue this medication.    Follow-up  Return in 6 to 8 weeks for follow up.       Attestation      1. Type 2 diabetes mellitus with hyperglycemia, with long-term current use of insulin  (HCC)  -     Continuous Glucose Sensor (FREESTYLE LIBRE 3 PLUS SENSOR) MISC;  Change sensor every 15 days, Disp-6 each, R-1Normal  -     Comprehensive Metabolic Panel; Future  -     CBC with Auto Differential; Future  -     Lipid Panel; Future  -     Microalbumin, Ur; Future  -     GAD-65 AUTOANTIBODY; Future  -     C-Peptide; Future  2. Essential hypertension, benign      [facility], was evaluated through a synchronous (real-time) audio-video encounter. The patient (or guardian if applicable) is aware that this is a billable service, which includes applicable co-pays. This Virtual Visit was conducted with patient's (and/or legal guardian's) consent. Patient identification was verified, and a caregiver was present when appropriate.   The patient was located at Home: [address]  [place] [address]  Provider was located at Home (Appt Dept State): [place]  Confirm you are appropriately licensed, registered, or certified to deliver care in the state where the patient is located as indicated above. If you are not or unsure, please re-schedule the visit: Yes, I confirm.       Total time spent on this encounter:  42 mins  The patient (or guardian, if applicable) and other individuals in attendance with the patient were advised that Artificial Intelligence will be utilized during this visit to record, process the conversation to generate a clinical note, and support improvement of the AI technology. The patient (or guardian, if applicable) and other individuals in attendance at the appointment consented to the use of AI, including the recording.      --Leanna Promise, PA on 02/09/2023 at 1:41 PM

## 2023-02-17 LAB — FECAL DNA COLORECTAL CANCER SCREENING (COLOGUARD)

## 2023-02-17 LAB — COLOGUARD

## 2023-02-18 LAB — HM DIABETES EYE EXAM
Diabetic Retinopathy: NEGATIVE
Diabetic Retinopathy: NEGATIVE
Diabetic Retinopathy: NEGATIVE
Diabetic Retinopathy: NEGATIVE

## 2023-02-24 ENCOUNTER — Encounter: Admit: 2023-02-24 | Admitting: Orthopaedic Surgery

## 2023-02-24 DIAGNOSIS — M545 Low back pain, unspecified: Secondary | ICD-10-CM

## 2023-02-24 DIAGNOSIS — M79605 Pain in left leg: Secondary | ICD-10-CM

## 2023-02-24 NOTE — Telephone Encounter (Addendum)
 Patient requesting refills for Gabapentin and IBU (both ordered by [facility] provider prior to seeing you)        Gabapentin appears to have refills and he thinks the IBU may as well.    He will check and let me know if not

## 2023-02-25 ENCOUNTER — Encounter: Payer: PRIVATE HEALTH INSURANCE | Attending: Orthopaedic Surgery | Primary: Family Medicine

## 2023-02-25 MED ORDER — IBUPROFEN 800 MG PO TABS
800 MG | ORAL_TABLET | Freq: Three times a day (TID) | ORAL | 1 refills | Status: DC
Start: 2023-02-25 — End: 2023-08-10

## 2023-02-25 NOTE — Addendum Note (Signed)
 Addended by: Jim Desanctis on: 02/25/2023 08:18 AM     Modules accepted: Orders

## 2023-02-25 NOTE — Telephone Encounter (Signed)
 He doesn't have any IUB refills, ordered if approved

## 2023-02-25 NOTE — Addendum Note (Signed)
 Addended by: Claudean Kinds on: 02/25/2023 09:28 AM     Modules accepted: Orders

## 2023-03-29 ENCOUNTER — Other Ambulatory Visit: Admit: 2023-03-29 | Discharge: 2023-03-29 | Payer: PRIVATE HEALTH INSURANCE | Primary: Family Medicine

## 2023-03-29 DIAGNOSIS — Z794 Long term (current) use of insulin: Secondary | ICD-10-CM

## 2023-03-29 NOTE — Progress Notes (Signed)
[id number]:  Verbal consent obtained; patient tolerated procedure well; site cleansed with aseptic technique; pressure applied.  Location: left arm  Technique: 21 g needle used

## 2023-03-31 ENCOUNTER — Telehealth
Admit: 2023-03-31 | Discharge: 2023-03-31 | Payer: PRIVATE HEALTH INSURANCE | Attending: Physician Assistant | Primary: Family Medicine

## 2023-03-31 DIAGNOSIS — Z794 Long term (current) use of insulin: Secondary | ICD-10-CM

## 2023-03-31 MED ORDER — INSULIN PEN NEEDLE 32G X 6 MM MISC
2 refills | 81.50 days | Status: DC
Start: 2023-03-31 — End: 2023-08-10

## 2023-03-31 MED ORDER — INSULIN LISPRO (1 UNIT DIAL) 100 UNIT/ML SC SOPN
100 UNIT/ML | SUBCUTANEOUS | 3 refills | Status: DC
Start: 2023-03-31 — End: 2023-08-10

## 2023-03-31 NOTE — Progress Notes (Signed)
 03/31/2023    TELEHEALTH EVALUATION -- Audio/Visual (During COVID-19 public health emergency)    History of Present Illness  The patient is a 53 year old male who presents via virtual visit for follow-up on diabetes. BS with less hypoglycemia since last visit. He is wearing his FSL 3+ CGM.    He reports experiencing hypoglycemia at the time of the consultation. He has recently increased his long-acting insulin dosage to 40 units, administered in the morning, a few days prior. His current medication regimen includes Jardiance 25 mg daily, Lantus, and metformin XR 1000 mg twice daily. He has undergone blood work but is uncertain about the availability of the results. He attempts to maintain a balanced diet, but admits to dietary discretion at times. He avoids white bread and has observed that pizza negatively impacts his blood sugar levels. He also notes that consuming chocolate or other high-sugar foods results in elevated blood sugar levels. He is gradually learning to identify foods that contribute to his hyperglycemia. His largest meal is typically lunch, which may include pasta, consumed around 11:00 AM. 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 His dinner usually consists of a sandwich. He expresses concern about his elevated blood sugar levels post-dinner and overnight and seeks advice on how to manage this. He requests a refill of his pen needles.  I have downloaded and reviewed patient's CGM. Continuous glucose monitoring is from date 1/10-1/23/25  Looking at the trend starting at midnight high overnight  Postprandial level are elevated  Hypoglycemic episodes mid afternoon rarely  Average glucose is 206 with 46 % time in range and 0 % hypoglycemia.  GMI 8.2%      MEDICATIONS  Jardiance, Lantus, metformin XR. Lipitor, losartan      Prior to Visit Medications    Medication Sig Taking? Authorizing Provider   insulin lispro, 1 Unit Dial, (HUMALOG KWIKPEN) 100  UNIT/ML SOPN Take 6-8 units with breakfast and dinner. MAX TDD: 16 units Yes Risa Auman, PA   Insulin Pen Needle 32G X 6 MM MISC Use one needle per injection Yes Lalana Wachter, PA   ibuprofen (ADVIL;MOTRIN) 800 MG tablet Take 1 tablet by mouth 3 times daily (with meals) Yes Aymond, Lowella Petties, MD   Continuous Glucose Sensor (FREESTYLE LIBRE 3 PLUS SENSOR) MISC Change sensor every 15 days Yes Kathe Mar[place], [place]   sildenafil (REVATIO) 20 MG tablet Take 1 tablet by mouth daily as needed (ED; take 30 min prior to sexual activity) Yes Blossom Hoops, MD   metFORMIN (GLUCOPHAGE-XR) 500 MG extended release tablet Take 2 tablets by mouth with breakfast and with evening meal Yes Blossom Hoops, MD   losartan (COZAAR) 100 MG tablet Take 1 tablet by mouth daily Yes Blossom Hoops, MD   insulin glargine (LANTUS SOLOSTAR) 100 UNIT/ML injection pen Inject 45-50 Units into the skin daily Yes Blossom Hoops, MD   ibuprofen (ADVIL;MOTRIN) 800 MG tablet Take 1 tablet by mouth every 8 hours as needed for Pain Yes Blossom Hoops, MD   hydroCHLOROthiazide (HYDRODIURIL) 25 MG tablet Take 1 tablet by mouth daily Yes Blossom Hoops, MD   gabapentin (NEURONTIN) 300 MG capsule Take 1 capsule by mouth 2 times daily for 180 days. Yes Blossom Hoops, MD   FLUoxetine (PROZAC) 40 MG capsule Take 1 capsule by mouth daily Yes Blossom Hoops, MD   empagliflozin (JARDIANCE) 25 MG tablet Take 1 tablet by mouth daily Yes Blossom Hoops, MD  atorvastatin (LIPITOR) 40 MG tablet Take 1 tablet by mouth daily Yes Blossom Hoops, MD   albuterol sulfate HFA (VENTOLIN HFA) 108 (90 Base) MCG/ACT inhaler Inhale 2 puffs into the lungs 4 times daily as needed for Wheezing Yes Blossom Hoops, MD     Social History     Tobacco Use    Smoking status: Former     Current packs/day: 0.00     Average packs/day: 1 pack/day for 14.3 years (14.3 ttl pk-yrs)     Types: Cigarettes     Start date: 2009     Quit date: 07/06/2021     Years since quitting: 1.7    Smokeless tobacco: Never    Substance Use Topics    Alcohol use: Not Currently    Drug use: Not Currently     Types: Methamphetamines (Crystal Meth)     No family history on file.  ROS: As per HPI, all others negative  There were no vitals taken for this visit.   PHYSICAL EXAMINATION:  Vital Signs: (As obtained by patient/caregiver or practitioner observation)      02/09/2023    11:33 AM   Patient-Reported Vitals   Patient-Reported Weight 180   Patient-Reported Height 68"   Patient-Reported Systolic 130 mmHg   Patient-Reported Diastolic 79 mmHg       Blood pressure-  Heart rate-    Respiratory rate-    Temperature-  Pulse oximetry-     Constitutional: [x]  Appears well-developed and well-nourished []  No apparent distress      []  Abnormal-   Mental status  [x]  Alert and awake  [x]  Oriented to person/place/time [x] Able to follow commands      Eyes:  EOM    [x]   Normal  []  Abnormal-  Sclera  [x]   Normal  []  Abnormal -         Discharge [x]   None visible  []  Abnormal -    HENT:   [x]  Normocephalic, atraumatic.  []  Abnormal   [x]  Mouth/Throat: Mucous membranes are moist.     External Ears [x]  Normal  []  Abnormal-     Neck: [x]  No visualized mass     Pulmonary/Chest: [x]  Respiratory effort normal.  [x]  No visualized signs of difficulty breathing or respiratory distress        []  Abnormal-      Musculoskeletal:   []  Normal gait with no signs of ataxia         [x]  Normal range of motion of neck        []  Abnormal-       Neurological:        [x]  No Facial Asymmetry (Cranial nerve 7 motor function) (limited exam to video visit)          [x]  No gaze palsy        []  Abnormal-         Skin:        [x]  No significant exanthematous lesions or discoloration noted on facial skin         []  Abnormal-            Psychiatric:       [x]  Normal Affect []  No Hallucinations        []  Abnormal-     Other pertinent observable physical exam findings-       No results found for this or any previous visit (from the past 672 hour(s)).     Assessment & Plan  1. Diabetes  Mellitus.  His blood glucose levels have been predominantly elevated, with occasional episodes of hypoglycemia. The [employer] data indicates frequent postprandial hyperglycemia, suggesting a potential decrease in endogenous insulin production. His time in target range is approximately 46 percent, with the remainder of the time being high. There is insufficient documentation of hypoglycemic episodes to register as a full percentage point. The predicted A1c is around 8.2 percent, indicating a slight elevation. His blood glucose levels tend to remain high overnight following dinner, which could be managed by administering insulin with the evening meal. His blood glucose levels are generally well-controlled during the day, with a slight decrease in the afternoon, possibly due to physical activity. The goal is to prevent daytime hypoglycemia, which can be dangerous. The Lantus dosage will be reduced to 35 units every morning. Rapid-acting insulin will be initiated at 6 units before breakfast and dinner. He is advised to monitor his blood glucose levels 2 hours post-meal and increase the insulin dose to 8 units with meals if the levels exceed 180. He will continue his current regimen of metformin and Jardiance. A prescription for rapid-acting insulin and pen needles will be sent to [facility]. The results of his blood work will be communicated via MyChart. If he is not producing insulin, we will discuss the possibility of insulin pump therapy and novel insulin delivery techniques, and potentially discontinue Jardiance and metformin. If he is still producing insulin, we may consider more aggressive glucose-lowering strategies, such as Ozempic. I believe he would be a good candidate for [employer] pump. I will see him back in 2-3 months with updated blood work before the visit.    Follow-up  The patient will follow up in 2 to 3 months.       Attestation      1. Type 2 diabetes mellitus with hyperglycemia,  with long-term current use of insulin (HCC)  -     insulin lispro, 1 Unit Dial, (HUMALOG KWIKPEN) 100 UNIT/ML SOPN; Take 6-8 units with breakfast and dinner. MAX TDD: 16 units, Disp-5 Adjustable Dose Pre-filled Pen Syringe, R-3Normal  -     Insulin Pen Needle 32G X 6 MM MISC; Disp-2 each, R-2, NormalUse one needle per injection  -     Lipid Panel; Future  -     Hemoglobin A1C; Future  -     Comprehensive Metabolic Panel; Future  -     CBC with Auto Differential; Future  -     Albumin/Creatinine Ratio, Urine; Future      [facility], was evaluated through a synchronous (real-time) audio-video encounter. The patient (or guardian if applicable) is aware that this is a billable service, which includes applicable co-pays. This Virtual Visit was conducted with patient's (and/or legal guardian's) consent. Patient identification was verified, and a caregiver was present when appropriate.   The patient was located at Home: [address]  [place] [address]  Provider was located at Home (Appt Dept State): [place]  Confirm you are appropriately licensed, registered, or certified to deliver care in the state where the patient is located as indicated above. If you are not or unsure, please re-schedule the visit: Yes, I confirm.       Return in about 3 months (around 06/29/2023) for follow up, glucose review.    Total time spent on this encounter:  32 mins    The patient (or guardian, if applicable) and other individuals in attendance with the patient were advised that Artificial  Intelligence will be utilized during this visit to record, process the conversation to generate a clinical note, and support improvement of the AI technology. The patient (or guardian, if applicable) and other individuals in attendance at the appointment consented to the use of AI, including the recording.      --Kathe Mariner, PA on 03/31/2023 at 3:50 PM

## 2023-04-04 ENCOUNTER — Encounter

## 2023-04-07 ENCOUNTER — Ambulatory Visit
Admit: 2023-04-07 | Discharge: 2023-04-07 | Payer: PRIVATE HEALTH INSURANCE | Attending: Orthopaedic Surgery | Primary: Family Medicine

## 2023-04-07 VITALS — Ht 69.0 in | Wt 194.0 lb

## 2023-04-07 DIAGNOSIS — M5126 Other intervertebral disc displacement, lumbar region: Secondary | ICD-10-CM

## 2023-04-07 NOTE — Progress Notes (Signed)
 04/07/2023     Harry Mitchell    [birth year]      53 y.o.    male   [address]  [place] [address]   Phone: [phone] (home)   PCP: Blossom Hoops, MD   Insurance: Payor: [employer] / Plan: [facility] / Product Type: *No Product type* /       Chief Complaint   Patient presents with    Follow-up     Back  WC DOI:  12/13/2022  MR Lumbar  12/22/2022  [facility]   PACS    [facility]        History of Present Illness:  Patient is seen at this time in follow-up.  He has undergone 14 visits of physical therapy thus far.  He is followed for disc abnormalities at multiple levels including a right sided foraminal disc protrusion L2-3 level, left paracentral disc protrusion L3-4 level as well as right paracentral and foraminal disc protrusion L4-5 level.  Patient has attended 14 visits of physical therapy thus far.  Overall this seems to have helped him to a significant degree.  He is pleased with his early progress at this time.    The patient is accompanied by work comp correspondence that states that the patient had an MRI scan back in 2017 and had a history of chronic back pain and degenerative changes at that time.  MRI scan report at that time confirmed disc abnormalities to a mild degree at L4-5 level left side which was contralateral to his recent study results    Patient recently had an updated hemoglobin A1c performed November 22 and this was 7.4 which was greatly improved from that of his previous levels.    Vitals: Ht 1.753 m (5\' 9" )   Wt 88 kg (194 lb)   BMI 28.65 kg/m      Current Outpatient Medications   Medication Sig Dispense Refill    insulin lispro, 1 Unit Dial, (HUMALOG KWIKPEN) 100 UNIT/ML SOPN Take 6-8 units with breakfast and dinner. MAX TDD: 16 units 5 Adjustable Dose Pre-filled Pen Syringe 3    Insulin Pen Needle 32G X 6 MM MISC Use one needle per injection 2 each 2    ibuprofen (ADVIL;MOTRIN) 800 MG tablet Take 1 tablet by mouth 3 times daily (with meals) 90  tablet 1    Continuous Glucose Sensor (FREESTYLE LIBRE 3 PLUS SENSOR) MISC Change sensor every 15 days 6 each 1    sildenafil (REVATIO) 20 MG tablet Take 1 tablet by mouth daily as needed (ED; take 30 min prior to sexual activity) 10 tablet 3    metFORMIN (GLUCOPHAGE-XR) 500 MG extended release tablet Take 2 tablets by mouth with breakfast and with evening meal 360 tablet 1    losartan (COZAAR) 100 MG tablet Take 1 tablet by mouth daily 90 tablet 1    insulin glargine (LANTUS SOLOSTAR) 100 UNIT/ML injection pen Inject 45-50 Units into the skin daily 15 mL 2    ibuprofen (ADVIL;MOTRIN) 800 MG tablet Take 1 tablet by mouth every 8 hours as needed for Pain 90 tablet 2    hydroCHLOROthiazide (HYDRODIURIL) 25 MG tablet Take 1 tablet by mouth daily 90 tablet 1    gabapentin (NEURONTIN) 300 MG capsule Take 1 capsule by mouth 2 times daily for 180 days. 60 capsule 5    FLUoxetine (PROZAC) 40 MG capsule Take 1 capsule by mouth daily 90 capsule 1    empagliflozin (JARDIANCE) 25 MG tablet  Take 1 tablet by mouth daily 90 tablet 1    atorvastatin (LIPITOR) 40 MG tablet Take 1 tablet by mouth daily 90 tablet 1    albuterol sulfate HFA (VENTOLIN HFA) 108 (90 Base) MCG/ACT inhaler Inhale 2 puffs into the lungs 4 times daily as needed for Wheezing 18 g 0     No current facility-administered medications for this visit.        Allergies   Allergen Reactions    Sulfa Antibiotics      REACTION: pt. can't remember reaction        Social History     Tobacco Use    Smoking status: Former     Current packs/day: 0.00     Average packs/day: 1 pack/day for 14.3 years (14.3 ttl pk-yrs)     Types: Cigarettes     Start date: 03/09/2007     Quit date: 07/06/2021     Years since quitting: 1.7    Smokeless tobacco: Never   Substance Use Topics    Alcohol use: Not Currently    Drug use: Not Currently     Types: Methamphetamines (Crystal Meth)          General Examination:   Patient seems to be moving with more fluidity.  He is having only localized lower  back discomfort.  He is not having any further sciatica type symptoms in either the left or right side.    MRI scan is reviewed reviewed with the patient.    Diagnosis:     ICD-10-CM    1. Lumbar disc herniation  M51.26              Treatment :  It appears within a reason degree of medical certainty that the patient had some lower back issues and had an MRI scan that showed some mild degenerative changes in 2017.  In review of the MRI report from 2017 as well as his more recent study.  There has been a significant change in his disc abnormalities.  It is my opinion to reason agree of medical certainty that the patient had mild pre-existing issues and has more of an issue now with his lower back based upon his most recent MRI scan earlier this year in 2024.    With respect to current diagnosis, it is my opinion to reason degree of medical certainty that the patient's diagnosis currently is out of his primary recent employment and lifting incident early October of this past year.    We will hold on any injection treatment at this time as his symptoms are mostly localized lower back discomfort and these seems to be improving.    We will have the patient continue with physical therapy and advance him up to work conditioning at [facility].  I recommend treatment 3 times per week for the next 5 weeks.    Patient will continue on limited duty status with no lifting more than 25 pounds and to avoid repetitive bending.    We will plan to see him in follow-up in 6 weeks.      Return in about 6 weeks (around 05/19/2023).      Electronically signed by Claudean Kinds, MD on 04/07/23 at 10:06 AM EST

## 2023-04-07 NOTE — Addendum Note (Signed)
Addended by: Jim Desanctis on: 04/07/2023 10:24 AM     Modules accepted: Orders

## 2023-05-20 ENCOUNTER — Encounter

## 2023-05-26 ENCOUNTER — Ambulatory Visit
Admit: 2023-05-26 | Discharge: 2023-05-26 | Payer: PRIVATE HEALTH INSURANCE | Attending: Orthopaedic Surgery | Primary: Family Medicine

## 2023-05-26 VITALS — Ht 69.0 in | Wt 194.0 lb

## 2023-05-26 DIAGNOSIS — M5126 Other intervertebral disc displacement, lumbar region: Secondary | ICD-10-CM

## 2023-05-26 NOTE — Addendum Note (Signed)
 Addended by: Jim Desanctis on: 05/26/2023 10:47 AM     Modules accepted: Orders

## 2023-05-26 NOTE — Progress Notes (Addendum)
 05/26/2023     Harry Mitchell    07/18/1970      53 y.o.    male   [address]  [place] [address]   Phone: [phone] (home)   PCP: Blossom Hoops, MD   Insurance: Payor: [employer] INSURANCE / Plan: [facility] / Product Type: *No Product type* /       Chief Complaint   Patient presents with    Follow-up     Back  WC DOI:  12/13/2022  MR Lumbar  12/22/2022  [facility]          History of Present Illness:  Patient seen at this time in follow-up.  He was last seen January 30.  Patient has completed additional visits of physical therapy and seems to have reached his maximum benefit from physical therapy.  Overall the patient is stable.  He continues to have some lower back pain as well as some intermittent right sided buttock and posterior thigh discomfort from time to time.  He has been on a lifting restriction of 25 pounds or less and has a job description that requires 50 pounds or less of lifting and carrying of weights of 50 pounds or less from short to medium distances.    Vitals: Ht 1.753 m (5\' 9" )   Wt 88 kg (194 lb)   BMI 28.65 kg/m      Current Outpatient Medications   Medication Sig Dispense Refill    insulin lispro, 1 Unit Dial, (HUMALOG KWIKPEN) 100 UNIT/ML SOPN Take 6-8 units with breakfast and dinner. MAX TDD: 16 units 5 Adjustable Dose Pre-filled Pen Syringe 3    Insulin Pen Needle 32G X 6 MM MISC Use one needle per injection 2 each 2    ibuprofen (ADVIL;MOTRIN) 800 MG tablet Take 1 tablet by mouth 3 times daily (with meals) 90 tablet 1    Continuous Glucose Sensor (FREESTYLE LIBRE 3 PLUS SENSOR) MISC Change sensor every 15 days 6 each 1    sildenafil (REVATIO) 20 MG tablet Take 1 tablet by mouth daily as needed (ED; take 30 min prior to sexual activity) 10 tablet 3    metFORMIN (GLUCOPHAGE-XR) 500 MG extended release tablet Take 2 tablets by mouth with breakfast and with evening meal 360 tablet 1    losartan (COZAAR) 100 MG tablet Take 1 tablet by mouth  daily 90 tablet 1    insulin glargine (LANTUS SOLOSTAR) 100 UNIT/ML injection pen Inject 45-50 Units into the skin daily 15 mL 2    ibuprofen (ADVIL;MOTRIN) 800 MG tablet Take 1 tablet by mouth every 8 hours as needed for Pain 90 tablet 2    hydroCHLOROthiazide (HYDRODIURIL) 25 MG tablet Take 1 tablet by mouth daily 90 tablet 1    gabapentin (NEURONTIN) 300 MG capsule Take 1 capsule by mouth 2 times daily for 180 days. 60 capsule 5    FLUoxetine (PROZAC) 40 MG capsule Take 1 capsule by mouth daily 90 capsule 1    empagliflozin (JARDIANCE) 25 MG tablet Take 1 tablet by mouth daily 90 tablet 1    atorvastatin (LIPITOR) 40 MG tablet Take 1 tablet by mouth daily 90 tablet 1    albuterol sulfate HFA (VENTOLIN HFA) 108 (90 Base) MCG/ACT inhaler Inhale 2 puffs into the lungs 4 times daily as needed for Wheezing 18 g 0     No current facility-administered medications for this visit.  Allergies   Allergen Reactions    Sulfa Antibiotics      REACTION: pt. can't remember reaction        Social History     Tobacco Use    Smoking status: Former     Current packs/day: 0.00     Average packs/day: 1 pack/day for 14.3 years (14.3 ttl pk-yrs)     Types: Cigarettes     Start date: 03/09/2007     Quit date: 07/06/2021     Years since quitting: 1.8    Smokeless tobacco: Never   Substance Use Topics    Alcohol use: Not Currently    Drug use: Not Currently     Types: Methamphetamines (Crystal Meth)          General Examination:   Exam today reveals well-developed male in no acute distress.  Straight leg raise produces lower back discomfort bilaterally as well as some right sided buttock pain.    MRI scan revealed evidence of right sided foraminal disc protrusion L2-3 level, left side L3-4 level as well as a right sided L4-5 level.    Diagnosis:     ICD-10-CM    1. Lumbar disc herniation  M51.26       2. Type 2 diabetes mellitus with hyperglycemia, with long-term current use of insulin (HCC)  E11.65     Z79.4              Treatment :  At  this time with his blood sugars under better control, I will order him to undergo an L4-5 epidural steroid injection, series of 2 with Dr. Brynda Greathouse at [facility].    I will also have him return back to work with a lifting restriction of 35 pounds or less and to avoid repetitive bending.    I will plan to see him in follow-up in 6 weeks.      Return in about 6 weeks (around 07/07/2023).      Electronically signed by Claudean Kinds, MD on 05/26/23 at 10:30 AM EDT   ADDENDUM:     Today I received some paperwork and a questionnaire from [employer].  This was dated February 23, 2023 asking my opinion regarding the patient's prior history of lower back pain and degenerative disc disease dating back to 2017.  The information included a MRI scan report that described a left paracentral disc protrusion L4-5 level.  This was performed April 21, 2015 , being performed at the request of Cecile Hearing, MD of [facility] health.  This was in [place] but does not list the state.    The patient's present disc abnormality is on the right side at L4-5 level.  The previously described left-sided disc protrusion on his recent MRI scan performed in October 2024 is no longer present.    As stated in my previous note, it is my opinion true degree of medical certainty that the patient has ongoing symptoms from his lifting incident at work in October 2024 and he is not yet at maximum medical improvement.  We have ordered a recent epidural steroid injection and we will plan to see him in follow-up in 6 weeks as stated above.      Electronically signed by Claudean Kinds, MD on 05/30/23 at 11:40 AM EDT

## 2023-05-30 NOTE — Telephone Encounter (Signed)
error 

## 2023-06-09 ENCOUNTER — Ambulatory Visit: Admit: 2023-06-09 | Payer: PRIVATE HEALTH INSURANCE | Primary: Family Medicine

## 2023-06-09 ENCOUNTER — Inpatient Hospital Stay: Payer: PRIVATE HEALTH INSURANCE | Attending: Anesthesiology

## 2023-06-09 MED ORDER — MIDAZOLAM HCL 2 MG/2ML IJ SOLN
2 | INTRAMUSCULAR | Status: DC | PRN
Start: 2023-06-09 — End: 2023-06-09
  Administered 2023-06-09: 13:00:00 2 via INTRAVENOUS

## 2023-06-09 MED ORDER — IOPAMIDOL 41 % IJ SOLN
41 | INTRAMUSCULAR | Status: DC | PRN
Start: 2023-06-09 — End: 2023-06-09
  Administered 2023-06-09: 13:00:00 3 via INTRATHECAL

## 2023-06-09 MED ORDER — METHYLPREDNISOLONE ACETATE 80 MG/ML IJ SUSP
80 | INTRAMUSCULAR | Status: DC | PRN
Start: 2023-06-09 — End: 2023-06-09
  Administered 2023-06-09: 13:00:00 80 via EPIDURAL

## 2023-06-09 MED ORDER — ONDANSETRON HCL 4 MG/2ML IJ SOLN
4 | Freq: Once | INTRAMUSCULAR | Status: DC | PRN
Start: 2023-06-09 — End: 2023-06-09

## 2023-06-09 MED ORDER — METHYLPREDNISOLONE ACETATE 80 MG/ML IJ SUSP
80 | INTRAMUSCULAR | Status: AC
Start: 2023-06-09 — End: ?

## 2023-06-09 MED ORDER — BUPIVACAINE HCL (PF) 0.25 % IJ SOLN
0.25 | INTRAMUSCULAR | Status: DC | PRN
Start: 2023-06-09 — End: 2023-06-09
  Administered 2023-06-09: 13:00:00 10 via INTRADERMAL

## 2023-06-09 MED ORDER — DIAZEPAM 5 MG PO TABS
5 | Freq: Once | ORAL | Status: DC | PRN
Start: 2023-06-09 — End: 2023-06-09

## 2023-06-09 MED ORDER — FENTANYL CITRATE (PF) 100 MCG/2ML IJ SOLN
100 | Freq: Once | INTRAMUSCULAR | Status: DC | PRN
Start: 2023-06-09 — End: 2023-06-09

## 2023-06-09 MED ORDER — BUPIVACAINE HCL (PF) 0.25 % IJ SOLN
0.25 | INTRAMUSCULAR | Status: AC
Start: 2023-06-09 — End: ?

## 2023-06-09 MED ORDER — MIDAZOLAM HCL 2 MG/2ML IJ SOLN
2 | Freq: Once | INTRAMUSCULAR | Status: DC | PRN
Start: 2023-06-09 — End: 2023-06-09

## 2023-06-09 MED ORDER — OXYCODONE-ACETAMINOPHEN 5-325 MG PO TABS
5-325 | Freq: Once | ORAL | Status: DC | PRN
Start: 2023-06-09 — End: 2023-06-09

## 2023-06-09 MED ORDER — MIDAZOLAM HCL 2 MG/2ML IJ SOLN
2 | INTRAMUSCULAR | Status: AC
Start: 2023-06-09 — End: ?

## 2023-06-09 MED FILL — METHYLPREDNISOLONE ACETATE 80 MG/ML IJ SUSP: 80 MG/ML | INTRAMUSCULAR | Qty: 1 | Fill #0

## 2023-06-09 MED FILL — MIDAZOLAM HCL 2 MG/2ML IJ SOLN: 2 MG/ML | INTRAMUSCULAR | Qty: 2 | Fill #0

## 2023-06-09 MED FILL — BUPIVACAINE HCL (PF) 0.25 % IJ SOLN: 0.25 % | INTRAMUSCULAR | Qty: 10 | Fill #0

## 2023-06-09 NOTE — H&P (Signed)
 Date: 06/09/2023    DX: Lumbar back pain with radicular component    PROCEDURE: LESI      Patient is here for a therapeutic pain procedure with fluoroscopy.    Vitals: See nursing notes    Mental status: Alert and oriented x3    Cardiovascular: Regular rate and rhythm    Lungs: Clear to auscultation bilaterally    Neuro: Nonfocal    Assessment:  Patient here for therapeutic pain procedure.     Plan:  Will plan to proceed with scheduled pain procedure        Rondall Allegra MD

## 2023-06-09 NOTE — Procedures (Signed)
 Date: 06/09/2023    Diagnosis: Lumbar radiculopathy    Referral: Dr. Isaias Sakai    Procedure: Lumbar epidural steroid injection    Sedation: Versed 2 mg    Level: L4-L5    Pain level pre procedure: 5 out of 10    Pain level post procedure: 5 out of 10      Procedure Note: The patient comes to the [facility]. [facility] for a lumbar epidural steroid injection.  The patient's major complaint is that of low back pain.  After a  discussion with the patient explaining the procedure, mechanism of action, hoped for benefit and risk involved including the risk of even permanent nerve damage or paralysis, informed consent was obtained.  The patient was taken to the block suite and placed in a prone position.  We then sterilely prepped and draped the area above the injection site.  Using 2 cc's of 1 % Xylocaine a skin wheal and the subcutaneous tissues were infiltrated for a good local anesthetic effect.  Then using an 18 gauge 3 1/2 inch Tuohy epidural needle I introduced it at the  level guided by biplanar fluoroscopy and loss of resistance technique.  Contrast was injected in the epidural space without vascular spread or intrathecal spread.  No blood, paresthesias or spinal fluid having been encountered 80 mg of Depo-Medrol mixed in 2ml of .25% Marcaine and 3 ml's of normal saline was injected into the epidural space.  The patient is resting in the post block suite and understands to follow up as directed.     Cyndie Mull, MD

## 2023-06-09 NOTE — Discharge Instructions (Signed)
 Pain Management Post Procedure Instructions Pain Management    The following post procedural instructions are for therapeutic injections such as: epidural steroid injections, facet injections, radiofrequency procedures and other pain management injections.  You may experience the following symptoms following your specific procedure:  Increased pain for a few days, which is probably due to the added pressure and irritation from the medication used in an already inflamed or irritated area.  Occasional bruising or muscle spasm related to the procedure.  If steroid medication was used during the injection, it may take one to seven days to begin working to relieve inflammation and swelling in your back or neck. The Lidocaine and/or Marcaine                 used as a numbing agent will usually wear off within 30 minutes to 6 hours.    Diet & Activity:  - Return to your regular diet.  - DO NOT drive a car or operate machinery for the rest of the day. (Unless you were told you did not receive sedation)  - You may apply a covered ice pack to the injection site, if needed-limit to 10 minutes on, followed by 10 minutes off. No heat pad/heat compress/hot pack to site for 3 days.  - Keep activity to minimum for the rest of the day.      Medicines:  - Continue home medications.  - If any of your regular medicines were stopped for this procedure, start taking them again tomorrow. (Blood thinners, motrin, aleve, aspirin )      Injection Site Care:  - Remove Band-Aid/Dressing in 24 hours and resume your normal hygiene routine tomorrow.  - Call your doctor if you experience excessive bleeding or drainage from injection site or if you have any signs/symptoms of infection such as pain and redness, swelling or odor at the site  Contact your physician immediately:  -Fever greater than 101 degrees F  -Increasing weakness or numbness in extremity ir change in bowel and/or bladder function.  -Severe persistent headache  -Contact your  physician if any problems occur or if questions arise after your departure from our facility      Call the Doctor Only if needed at [phone] IN THE CASE OF AN EMERGENCY- DIAL 911

## 2023-06-23 ENCOUNTER — Inpatient Hospital Stay: Payer: PRIVATE HEALTH INSURANCE | Attending: Anesthesiology

## 2023-06-23 MED ORDER — BUPIVACAINE HCL (PF) 0.25 % IJ SOLN
0.25 | INTRAMUSCULAR | Status: DC | PRN
Start: 2023-06-23 — End: 2023-06-23
  Administered 2023-06-23: 13:00:00 10 via INTRADERMAL

## 2023-06-23 MED ORDER — METHYLPREDNISOLONE ACETATE 80 MG/ML IJ SUSP
80 | INTRAMUSCULAR | Status: DC | PRN
Start: 2023-06-23 — End: 2023-06-23
  Administered 2023-06-23: 13:00:00 80 via EPIDURAL

## 2023-06-23 MED ORDER — BUPIVACAINE HCL (PF) 0.25 % IJ SOLN
0.25 | INTRAMUSCULAR | Status: AC
Start: 2023-06-23 — End: ?

## 2023-06-23 MED ORDER — MIDAZOLAM HCL 2 MG/2ML IJ SOLN
2 | INTRAMUSCULAR | Status: DC | PRN
Start: 2023-06-23 — End: 2023-06-23
  Administered 2023-06-23: 13:00:00 2 via INTRAVENOUS

## 2023-06-23 MED ORDER — IOPAMIDOL 41 % IJ SOLN
41 | INTRAMUSCULAR | Status: DC | PRN
Start: 2023-06-23 — End: 2023-06-23
  Administered 2023-06-23: 13:00:00 3 via INTRATHECAL

## 2023-06-23 MED ORDER — MIDAZOLAM HCL 2 MG/2ML IJ SOLN
2 | INTRAMUSCULAR | Status: AC
Start: 2023-06-23 — End: ?

## 2023-06-23 MED ORDER — METHYLPREDNISOLONE ACETATE 80 MG/ML IJ SUSP
80 | INTRAMUSCULAR | Status: AC
Start: 2023-06-23 — End: ?

## 2023-06-23 MED FILL — MIDAZOLAM HCL 2 MG/2ML IJ SOLN: 2 MG/ML | INTRAMUSCULAR | Qty: 2 | Fill #0

## 2023-06-23 MED FILL — BUPIVACAINE HCL (PF) 0.25 % IJ SOLN: 0.25 % | INTRAMUSCULAR | Qty: 10 | Fill #0

## 2023-06-23 MED FILL — METHYLPREDNISOLONE ACETATE 80 MG/ML IJ SUSP: 80 MG/ML | INTRAMUSCULAR | Qty: 1 | Fill #0

## 2023-06-23 NOTE — H&P (Signed)
 Date: 06/23/2023    DX: Lumbar radiculopathy    PROCEDURE: LESI      Patient is here for a therapeutic pain procedure with fluoroscopy.    Vitals: See nursing notes    Mental status: Alert and oriented x3    Cardiovascular: Regular rate and rhythm    Lungs: Clear to auscultation bilaterally    Neuro: Nonfocal    Assessment:  Patient here for therapeutic pain procedure.     Plan:  Will plan to proceed with scheduled pain procedure        Ima Maltos MD

## 2023-06-23 NOTE — Discharge Instructions (Signed)
 Pain Management Post Procedure Instructions Pain Management    The following post procedural instructions are for therapeutic injections such as: epidural steroid injections, facet injections, radiofrequency procedures and other pain management injections.  You may experience the following symptoms following your specific procedure:  Increased pain for a few days, which is probably due to the added pressure and irritation from the medication used in an already inflamed or irritated area.  Occasional bruising or muscle spasm related to the procedure.  If steroid medication was used during the injection, it may take one to seven days to begin working to relieve inflammation and swelling in your back or neck. The Lidocaine and/or Marcaine                 used as a numbing agent will usually wear off within 30 minutes to 6 hours.    Diet & Activity:  - Return to your regular diet.  - DO NOT drive a car or operate machinery for the rest of the day. (Unless you were told you did not receive sedation)  - You may apply a covered ice pack to the injection site, if needed-limit to 10 minutes on, followed by 10 minutes off. No heat pad/heat compress/hot pack to site for 3 days.  - Keep activity to minimum for the rest of the day.      Medicines:  - Continue home medications.  - If any of your regular medicines were stopped for this procedure, start taking them again tomorrow. (Blood thinners, motrin, aleve, aspirin )      Injection Site Care:  - Remove Band-Aid/Dressing in 24 hours and resume your normal hygiene routine tomorrow.  - Call your doctor if you experience excessive bleeding or drainage from injection site or if you have any signs/symptoms of infection such as pain and redness, swelling or odor at the site  Contact your physician immediately:  -Fever greater than 101 degrees F  -Increasing weakness or numbness in extremity ir change in bowel and/or bladder function.  -Severe persistent headache  -Contact your  physician if any problems occur or if questions arise after your departure from our facility      Call the Doctor Only if needed at [phone] IN THE CASE OF AN EMERGENCY- DIAL 911

## 2023-06-23 NOTE — Procedures (Signed)
 Date: 06/23/2023    Diagnosis: Lumbar radiculopathy    Referral: Dr. Wayman Hai    Procedure: Lumbar epidural steroid injection    Sedation: Versed  2 mg    Level: L4-L5    Pain level pre procedure: 6 out of 10    Pain level post procedure: 6 out of 10      Procedure Note: The patient comes to the [facility]. [facility] for a lumbar epidural steroid injection.  The patient's major complaint is that of low back pain.  After a  discussion with the patient explaining the procedure, mechanism of action, hoped for benefit and risk involved including the risk of even permanent nerve damage or paralysis, informed consent was obtained.  The patient was taken to the block suite and placed in a prone position.  We then sterilely prepped and draped the area above the injection site.  Using 2 cc's of 1 % Xylocaine a skin wheal and the subcutaneous tissues were infiltrated for a good local anesthetic effect.  Then using an 18 gauge 3 1/2 inch Tuohy epidural needle I introduced it at the  level guided by biplanar fluoroscopy and loss of resistance technique.  Contrast was injected in the epidural space without vascular spread or intrathecal spread.  No blood, paresthesias or spinal fluid having been encountered 80 mg of Depo-Medrol  mixed in 2ml of .25% Marcaine  and 3 ml's of normal saline was injected into the epidural space.  The patient is resting in the post block suite and understands to follow up as directed.     Charise Companion, MD

## 2023-07-07 ENCOUNTER — Encounter
Admit: 2023-07-07 | Discharge: 2023-07-07 | Payer: PRIVATE HEALTH INSURANCE | Attending: Physician Assistant | Primary: Family Medicine

## 2023-07-07 ENCOUNTER — Ambulatory Visit
Admit: 2023-07-07 | Discharge: 2023-07-07 | Payer: PRIVATE HEALTH INSURANCE | Attending: Orthopaedic Surgery | Primary: Family Medicine

## 2023-07-07 VITALS — Ht 68.0 in | Wt 180.0 lb

## 2023-07-07 DIAGNOSIS — M5126 Other intervertebral disc displacement, lumbar region: Secondary | ICD-10-CM

## 2023-07-07 DIAGNOSIS — Z794 Long term (current) use of insulin: Secondary | ICD-10-CM

## 2023-07-07 NOTE — Progress Notes (Signed)
 07/07/2023     Harry Mitchell    1970-08-02      53 y.o.    male   [address]  [place] [address]   Phone: [phone] (home)   PCP: Daymon Evans, MD   Insurance: Payor: [employer] INSURANCE / Plan: [facility] / Product Type: *No Product type* /       Chief Complaint   Patient presents with    Follow-up     Back  WC DOI:  12/13/2022  MR Lumbar  12/22/2022  [facility]  L4-5  Argyle Kurk:  06/09/2023, 06/23/2023        History of Present Illness:  Patient seen at this time in follow-up.  Since his last visit he has undergone 2 L4-5 epidural steroid injections with Dr. Marney Sinning.  These were performed April 3 and April 17.  Overall this is given the patient significant relief of his symptoms around 90% after his second injection.  He is feeling much better.    Vitals: Ht 1.727 m (5\' 8" )   Wt 81.6 kg (180 lb)   BMI 27.37 kg/m      Current Outpatient Medications   Medication Sig Dispense Refill    insulin  lispro, 1 Unit Dial , (HUMALOG  KWIKPEN) 100 UNIT/ML SOPN Take 6-8 units with breakfast and dinner. MAX TDD: 16 units 5 Adjustable Dose Pre-filled Pen Syringe 3    Insulin  Pen Needle 32G X 6 MM MISC Use one needle per injection 2 each 2    ibuprofen  (ADVIL ;MOTRIN ) 800 MG tablet Take 1 tablet by mouth 3 times daily (with meals) 90 tablet 1    Continuous Glucose Sensor (FREESTYLE LIBRE 3 PLUS SENSOR) MISC Change sensor every 15 days 6 each 1    sildenafil  (REVATIO ) 20 MG tablet Take 1 tablet by mouth daily as needed (ED; take 30 min prior to sexual activity) 10 tablet 3    metFORMIN  (GLUCOPHAGE -XR) 500 MG extended release tablet Take 2 tablets by mouth with breakfast and with evening meal 360 tablet 1    losartan  (COZAAR ) 100 MG tablet Take 1 tablet by mouth daily 90 tablet 1    insulin  glargine (LANTUS  SOLOSTAR) 100 UNIT/ML injection pen Inject 45-50 Units into the skin daily 15 mL 2    ibuprofen  (ADVIL ;MOTRIN ) 800 MG tablet Take 1 tablet by mouth every 8 hours as needed for  Pain 90 tablet 2    hydroCHLOROthiazide  (HYDRODIURIL ) 25 MG tablet Take 1 tablet by mouth daily 90 tablet 1    gabapentin  (NEURONTIN ) 300 MG capsule Take 1 capsule by mouth 2 times daily for 180 days. 60 capsule 5    FLUoxetine  (PROZAC ) 40 MG capsule Take 1 capsule by mouth daily 90 capsule 1    atorvastatin  (LIPITOR) 40 MG tablet Take 1 tablet by mouth daily 90 tablet 1    albuterol  sulfate HFA (VENTOLIN  HFA) 108 (90 Base) MCG/ACT inhaler Inhale 2 puffs into the lungs 4 times daily as needed for Wheezing 18 g 0     No current facility-administered medications for this visit.        Allergies   Allergen Reactions    Sulfa Antibiotics      REACTION: pt. can't remember reaction        Social History     Tobacco Use    Smoking status: Former     Current packs/day: 0.00     Average packs/day: 1 pack/day for 14.3 years (  14.3 ttl pk-yrs)     Types: Cigarettes     Start date: 03/09/2007     Quit date: 07/06/2021     Years since quitting: 2.0    Smokeless tobacco: Never   Substance Use Topics    Alcohol use: Not Currently    Drug use: Not Currently     Types: Methamphetamines (Crystal Meth)          General Examination:   Exam today reveals improved range of motion of the lumbar spine in flexion as well as extension.  Reflexes are 1+ in the patella and Achilles tendons.  Straight leg raise produces mild degree of lumbar pain only.    Diagnosis:     ICD-10-CM    1. Lumbar disc herniation  M51.26       2. Type 2 diabetes mellitus with hyperglycemia, with long-term current use of insulin  (HCC)  E11.65     Z79.4              Treatment :  At this time I will release the patient to return back to full regular work activities as of Jul 11, 2023.  In the interim he will continue on limited duty status with no lifting greater than 35 pounds.    I would like to see him in follow-up in 6 weeks.  If he is doing well at that time he would be rated and released with future needs likely including physical therapy as well as lumbar  injection.      Return in about 6 weeks (around 08/18/2023).      Electronically signed by Jacqueline Matsu, MD on 07/07/23 at 9:22 AM EDT

## 2023-07-11 NOTE — Progress Notes (Signed)
 Note in error. Please disregard.  Pt was unable to complete visit. Bad connection. Will need to reschedule.

## 2023-08-10 ENCOUNTER — Encounter

## 2023-08-10 ENCOUNTER — Ambulatory Visit
Admit: 2023-08-10 | Discharge: 2023-08-10 | Payer: PRIVATE HEALTH INSURANCE | Attending: Family Medicine | Primary: Family Medicine

## 2023-08-10 VITALS — BP 138/78 | HR 54 | Ht 68.0 in | Wt 202.0 lb

## 2023-08-10 DIAGNOSIS — I10 Essential (primary) hypertension: Secondary | ICD-10-CM

## 2023-08-10 MED ORDER — HYDROCHLOROTHIAZIDE 25 MG PO TABS
25 | ORAL_TABLET | Freq: Every day | ORAL | 1 refills | 90.00000 days | Status: DC
Start: 2023-08-10 — End: 2024-02-23

## 2023-08-10 MED ORDER — IBUPROFEN 800 MG PO TABS
800 | ORAL_TABLET | Freq: Three times a day (TID) | ORAL | 1 refills | 10.00 days | Status: DC
Start: 2023-08-10 — End: 2023-08-10

## 2023-08-10 MED ORDER — INSULIN LISPRO (1 UNIT DIAL) 100 UNIT/ML SC SOPN
100 | SUBCUTANEOUS | 3 refills | 30.00000 days | Status: DC
Start: 2023-08-10 — End: 2023-12-23

## 2023-08-10 MED ORDER — LANTUS SOLOSTAR 100 UNIT/ML SC SOPN
100 | Freq: Every day | SUBCUTANEOUS | 1 refills | 60.00 days | Status: DC
Start: 2023-08-10 — End: 2023-08-10

## 2023-08-10 MED ORDER — ATORVASTATIN CALCIUM 40 MG PO TABS
40 | ORAL_TABLET | Freq: Every day | ORAL | 1 refills | 90.00000 days | Status: DC
Start: 2023-08-10 — End: 2024-02-23

## 2023-08-10 MED ORDER — METFORMIN HCL ER 500 MG PO TB24
500 | ORAL_TABLET | Freq: Two times a day (BID) | ORAL | 1 refills | 90.00000 days | Status: DC
Start: 2023-08-10 — End: 2023-09-07

## 2023-08-10 MED ORDER — LOSARTAN POTASSIUM 100 MG PO TABS
100 | ORAL_TABLET | Freq: Every day | ORAL | 1 refills | 90.00000 days | Status: DC
Start: 2023-08-10 — End: 2024-02-23

## 2023-08-10 MED ORDER — INSULIN PEN NEEDLE 32G X 6 MM MISC
3 refills | 81.50 days | Status: AC
Start: 2023-08-10 — End: ?

## 2023-08-10 MED ORDER — FLUOXETINE HCL 40 MG PO CAPS
40 | ORAL_CAPSULE | Freq: Every day | ORAL | 1 refills | 90.00 days | Status: AC
Start: 2023-08-10 — End: ?

## 2023-08-10 MED ORDER — SILDENAFIL CITRATE 20 MG PO TABS
20 | ORAL_TABLET | Freq: Every day | ORAL | 3 refills | 30.00000 days | Status: DC | PRN
Start: 2023-08-10 — End: 2023-12-23

## 2023-08-10 MED ORDER — FREESTYLE LIBRE 3 PLUS SENSOR MISC
1 refills | 30.00000 days | Status: DC
Start: 2023-08-10 — End: 2023-12-27

## 2023-08-10 MED ORDER — GABAPENTIN 300 MG PO CAPS
300 | ORAL_CAPSULE | Freq: Two times a day (BID) | ORAL | 5 refills | 30.00000 days | Status: DC
Start: 2023-08-10 — End: 2024-02-23

## 2023-08-10 MED ORDER — PEN NEEDLES 32G X 6 MM MISC
Freq: Two times a day (BID) | 3 refills | 30.00000 days | Status: DC
Start: 2023-08-10 — End: 2024-04-12

## 2023-08-10 MED ORDER — LANTUS SOLOSTAR 100 UNIT/ML SC SOPN
100 | Freq: Every day | SUBCUTANEOUS | 1 refills | 60.00 days | Status: AC
Start: 2023-08-10 — End: ?

## 2023-08-10 NOTE — Telephone Encounter (Signed)
 Pt here for follow-up with primary care-  due for endocrinology follow-up. Looks like he had an appt scheduled two months ago but had connectivity issues and now he's scheduled out to September. Last lab results from November though appears he may have had labs drawn at time of appt in January though says in process. Not sure if you received those results separately? I am having my office try to reach out to lab about getting those results. I went ahead and sent a month refill on his insulin  with strict instructions to get fasting labs drawn ASAP- currently set up for tomorrow. Told him he will need to see you for follow-up after the labs for further management on diabetes.  Hopefully you'll be able to get him in sooner hoping he's compliant with lab draw.

## 2023-08-10 NOTE — Progress Notes (Signed)
 Harry Mitchell [place] (DOB:  Jun 25, 1970) is a 53 y.o. male,Established patient, here for evaluation of the following chief complaint(s):  Follow-up (Colonoscopy/cologuard?)      ASSESSMENT/PLAN:      ICD-10-CM    1. Essential hypertension, benign  I10 losartan  (COZAAR ) 100 MG tablet     hydroCHLOROthiazide  (HYDRODIURIL ) 25 MG tablet     TSH reflex to FT4      2. Type 2 diabetes mellitus with hyperglycemia, with long-term current use of insulin  (HCC)  E11.65 Insulin  Pen Needle 32G X 6 MM MISC    Z79.4       3. Type 2 diabetes mellitus with complication (HCC)  E11.8 insulin  glargine (LANTUS  SOLOSTAR) 100 UNIT/ML injection pen     metFORMIN  (GLUCOPHAGE -XR) 500 MG extended release tablet      4. Lumbar pain with radiation down left leg  M54.50 gabapentin  (NEURONTIN ) 300 MG capsule    M79.605       5. Mixed hyperlipidemia  E78.2 atorvastatin  (LIPITOR) 40 MG tablet      6. Anxiety  F41.9 FLUoxetine  (PROZAC ) 40 MG capsule      7. Erectile dysfunction, unspecified erectile dysfunction type  N52.9 sildenafil  (REVATIO ) 20 MG tablet          Assessment & Plan  1. Diabetes Mellitus.  - Currently taking fast-acting insulin  twice a day and long-acting insulin  once a day.  - Advised to continue using glucose monitor and adjust insulin  dosage based on endocrinologist's recommendations.  - Fasting labs ordered to assess current status, including thyroid test, cholesterol, kidney function, blood count - OVER DUE , pt to schedule ASAP   - Prescription for insulin  needles provided; advised to follow up with endocrinology for diabetes management.  - Sending message about sooner follow-up for endocrinology      2. Hypertension.  - Blood pressure is currently well-controlled.  - Advised to continue current antihypertensive regimen.  - Fasting labs ordered to assess current status, including thyroid test, cholesterol, kidney function, blood count, and PSA levels.    3. Back Pain.  - Received back injections from Dr. Marney Sinning in 06/2023,  which have provided some relief.  - Advised to continue current pain management regimen, including the use of ibuprofen  as needed.  - Monitoring for any changes in pain or function.    4. Anxiety   - well controlled on prozac      5. Health Maintenance.  - Advised to use sunscreen on exposed areas and wear longer pants to protect skin.  - Has a Cologuard test kit at home and encouraged to complete the test.  - Fasting labs ordered to assess current status, including thyroid test, cholesterol, kidney function, blood count.  - PSA will be due after August / next labs     Follow-up  The patient will follow up in 6 months.      Return for fasting labs ASAP (for Erin Ruse/Anis Degidio);  6 mo f/u CPE/ HTN/ DM (fasting labs prior)  .    [id number] Billed for moderate complexity with management in 3+ chronic conditions, change in medications, orders placed for further evaluation.       SUBJECTIVE/OBJECTIVE:  HPI    Loel Betancur is a 53 y.o. male  that presents for follow-up.     History of Present Illness  The patient is a 53 year old male here for a follow-up visit.    He has been managing his diabetes with insulin , administering fast-acting insulin  twice daily and long-acting insulin  once daily.  However, he has been self-adjusting his dosage, taking it three times daily. He continues to monitor his glucose levels at home, although he has not done so in the past 12 days. His glucose levels have been generally well-controlled, with occasional spikes due to steroid injections. The highest recorded level was 8.1. During the month of steroid injections, he increased his fast-acting insulin  dosage to 45 units. He reports no numbness, tingling, skin changes, or ulcers.    He has been experiencing some relief from his back pain following injections administered by Dr. Marney Sinning in 06/2023.    He has a Cologuard test kit at home but has not yet used it.      CHRONIC HX/PRIOR HX:     HTN- Currently taking HCTZ and Losartan  for BP control.   Denies any s/e of use.  BP appropriate. Due for labs     HLD- Currently taking 40 mg atorvastatin . No medication s/e. Overdue for labs.     Anxiety/depression- Currently taking 40 Prozac .  Patient reports he has been on same medication for a few years.  He has inadvertently stopped medication and had significant return in symptoms at that time. Currently well controlled.    DM2-  Last A1C was downtrending at 7.5 in 11/24. Currently taking Metformin  1000mg  BID, jardiance  25mg   and about lantus  - last adjusted 1/25 with lantus  35U daily and short acting insulin  - approx 6U 2-3x/day . Home sugars have fluctuated some - notes some higher levels when he had steroid shots, some recent trouble with phone / libra connectivity.      Back pain - had injury at work causing lumbar back pain.  Has been seeing Dr Wayman Hai, had LESI x 2 in April, some improvement but still residual pain.     Patient with history of substance abuse.  Has been clean since moving to [facility] to get his life back on track.  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    Immunization History   Administered Date(s) Administered    COVID-19, MODERNA BLUE border, Primary or Immunocompromised, (age 12y+), IM, 100 mcg/0.5mL 11/08/2019    Influenza Virus Vaccine 12/03/2019, 12/11/2020    Influenza, FLUARIX, FLULAVAL, FLUZONE (age 18 mo+) and AFLURIA, (age 8 y+), Quadv PF, 0.5mL 03/31/2022    PPD Test 07/12/2011    Pneumococcal, PPSV23, PNEUMOVAX 23, (age 2y+), SC/IM, 0.5mL 08/24/2010    TD 2LF, TDVAX, (age 7y+), IM, 0.5mL 05/04/2006    TDaP, ADACEL (age 39y-64y), Alm Jacks (age 10y+), IM, 0.5mL 05/30/2017    Zoster Recombinant (Shingrix) 12/11/2020           Current Outpatient Medications   Medication Sig Dispense Refill    sildenafil  (REVATIO ) 20 MG tablet Take 1 tablet by mouth daily as needed (ED; take 30 min prior to sexual activity) 10 tablet 3    losartan  (COZAAR ) 100 MG tablet Take 1 tablet by mouth daily 90 tablet 1    insulin   glargine (LANTUS  SOLOSTAR) 100 UNIT/ML injection pen Inject 45-50 Units into the skin daily 5 mL 1    metFORMIN  (GLUCOPHAGE -XR) 500 MG extended release tablet Take 2 tablets by mouth with breakfast and with evening meal 360 tablet 1    hydroCHLOROthiazide  (HYDRODIURIL ) 25 MG tablet Take 1 tablet by mouth daily 90 tablet 1    gabapentin  (NEURONTIN ) 300 MG capsule Take 1 capsule by mouth 2 times daily for 180 days. 60 capsule 5    FLUoxetine  (PROZAC ) 40 MG capsule Take 1  capsule by mouth daily 90 capsule 1    atorvastatin  (LIPITOR) 40 MG tablet Take 1 tablet by mouth daily 90 tablet 1    Insulin  Pen Needle 32G X 6 MM MISC Use one needle per injection -has 4 injections / day 400 each 3    Continuous Glucose Sensor (FREESTYLE LIBRE 3 PLUS SENSOR) MISC Change sensor every 15 days 6 each 1    insulin  lispro, 1 Unit Dial , (HUMALOG  KWIKPEN) 100 UNIT/ML SOPN Take 6-8 units with breakfast and dinner. MAX TDD: 16 units 5 Adjustable Dose Pre-filled Pen Syringe 3    ibuprofen  (ADVIL ;MOTRIN ) 800 MG tablet Take 1 tablet by mouth every 8 hours as needed for Pain 90 tablet 2    albuterol  sulfate HFA (VENTOLIN  HFA) 108 (90 Base) MCG/ACT inhaler Inhale 2 puffs into the lungs 4 times daily as needed for Wheezing (Patient not taking: Reported on 08/10/2023) 18 g 0     No current facility-administered medications for this visit.        Prior to Admission medications    Medication Sig Start Date End Date Taking? Authorizing Provider   sildenafil  (REVATIO ) 20 MG tablet Take 1 tablet by mouth daily as needed (ED; take 30 min prior to sexual activity) 08/10/23  Yes Daymon Evans, MD   losartan  (COZAAR ) 100 MG tablet Take 1 tablet by mouth daily 08/10/23  Yes Daymon Evans, MD   insulin  glargine (LANTUS  SOLOSTAR) 100 UNIT/ML injection pen Inject 45-50 Units into the skin daily 08/10/23  Yes Daymon Evans, MD   metFORMIN  (GLUCOPHAGE -XR) 500 MG extended release tablet Take 2 tablets by mouth with breakfast and with evening meal 08/10/23  Yes Daymon Evans, MD   hydroCHLOROthiazide  (HYDRODIURIL ) 25 MG tablet Take 1 tablet by mouth daily 08/10/23 02/06/24 Yes Daymon Evans, MD   gabapentin  (NEURONTIN ) 300 MG capsule Take 1 capsule by mouth 2 times daily for 180 days. 08/10/23 02/06/24 Yes Daymon Evans, MD   FLUoxetine  (PROZAC ) 40 MG capsule Take 1 capsule by mouth daily 08/10/23  Yes Daymon Evans, MD   atorvastatin  (LIPITOR) 40 MG tablet Take 1 tablet by mouth daily 08/10/23 02/06/24 Yes Daymon Evans, MD   Insulin  Pen Needle 32G X 6 MM MISC Use one needle per injection -has 4 injections / day 08/10/23  Yes Daymon Evans, MD   Continuous Glucose Sensor (FREESTYLE LIBRE 3 PLUS SENSOR) MISC Change sensor every 15 days 08/10/23   Leanna Promise, PA   insulin  lispro, 1 Unit Dial , (HUMALOG  KWIKPEN) 100 UNIT/ML SOPN Take 6-8 units with breakfast and dinner. MAX TDD: 16 units 08/10/23   Ruse, Manning, PA   ibuprofen  (ADVIL ;MOTRIN ) 800 MG tablet Take 1 tablet by mouth every 8 hours as needed for Pain 02/09/23   Daymon Evans, MD   albuterol  sulfate HFA (VENTOLIN  HFA) 108 (90 Base) MCG/ACT inhaler Inhale 2 puffs into the lungs 4 times daily as needed for Wheezing  Patient not taking: Reported on 08/10/2023 02/09/23   Daymon Evans, MD       Past Medical History:   Diagnosis Date    Hyperlipidemia     Hypertension     Type 2 diabetes mellitus without complication [facility])         Past Surgical History:   Procedure Laterality Date    ANTERIOR CRUCIATE LIGAMENT REPAIR Right     FINGER AMPUTATION Left     Left Long finger    PAIN MANAGEMENT PROCEDURE N/A 06/09/2023  WC LESI L4-5 performed by Charise Companion, MD at [facility] PAIN MANAGEMENT     PAIN MANAGEMENT PROCEDURE N/A 06/23/2023    WC/LESI #2; L4-5 performed by Charise Companion, MD at [facility] PAIN MANAGEMENT        REVIEW OF SYSTEMS:   ROS as noted in HPI, otherwise negative       BP 138/78   Pulse 54   Ht 1.727 m (5\' 8" )   Wt 91.6 kg (202 lb)   SpO2 98%   BMI 30.71 kg/m      PHYSICAL EXAM:  General appearance - alert, well appearing, and  in no distress  Neck - normal ROM, no cervical LAD, no thyroid enlargement or nodularity  Mouth - mucous membranes moist, poor dentition  Chest - clear to auscultation, no wheezes, normal work of breathing  Heart - normal rate and regular rhythm, no murmurs noted  Abd - soft, NTND, +BS x 4 quadrants   Neurological - alert, normal speech, no focal findings  Extremities - peripheral pulses normal, no edema         No results found for this visit on 08/10/23.         No results found for this or any previous visit (from the past 2160 hours).    Hemoglobin A1C   Date Value Ref Range Status   01/28/2023 7.5 (H) 4.0 - 6.0 % Final     Comment:     HEMOGLOBIN A1C INTERPRETATION:    The following arbitrary ranges may be used for interpretation of the results.  However, factors such as duration of diabetes, adherence to therapy, and  patient age should also be considered in assessing degree of blood glucose  control.    Hemoglobin A1C                 Avg. Blood Sugar  --------------------------------------------------------------  6%                           135 mg/dL  7%                           170 mg/dL  8%                           205 mg/dL  9%                           240 mg/dL  81%                          275 mg/dL    ======================================================    A1C                      Glucose Control  ----------------------------------------------------------------  < 6.0 %                   Normal  6.0 - 6.9 %               Abnormal  7.0 - 7.9 %               Sub-Optimal Control  > 8.0 %                   Inadequate Control  Lab Results   Component Value Date    NA 138 11/01/2022    K 4.2 11/01/2022    CL 99 11/01/2022    CO2 27 11/01/2022    BUN 28 (H) 11/01/2022    CREATININE 1.1 11/01/2022    GLUCOSE 119 (H) 11/01/2022    CALCIUM  10.0 11/01/2022    BILITOT <0.15 11/01/2022    ALKPHOS 86 11/01/2022    AST 23 11/01/2022    ALT 27 11/01/2022    LABGLOM 80 11/01/2022    GLOB 2.6 11/01/2022      Lab Results   Component Value Date    CHOL 134 03/16/2022    TRIG 71 03/16/2022    HDL 43 03/16/2022    LDL 76.8 03/16/2022    VLDL 14.2 03/16/2022    CHOLHDLRATIO 3.1 03/16/2022     Lab Results   Component Value Date    PSA 2.020 11/01/2022         MRI Result (most recent):  MRI LUMBAR SPINE WO CONTRAST 04/21/2015    Narrative  CLINICAL DATA:  Lumbar pain with left leg radiculopathy in an L5  distribution. Hyperextension injury 09/2014.    EXAM:  MRI LUMBAR SPINE WITHOUT CONTRAST    TECHNIQUE:  Multiplanar, multisequence MR imaging of the lumbar spine was  performed. No intravenous contrast was administered.    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  Numbering preserved from that study, with the transitional segment  considered a nearly completely lumbarized S1.    Vertebral alignment is normal. Vertebral body heights are preserved.  A small L1 superior endplate Schmorl's node is noted. Moderate disc  space height loss and mild degenerative endplate edema are present  at L4-5. Disc desiccation is noted at L4-5 and L5-S1. There is also  moderate disc space height loss at L1-2. Conus medullaris is normal  in signal and terminates at the mid to upper L2 level. Paraspinal  soft tissues are unremarkable.    L1-2: Mild circumferential disc bulging results in mild left greater  than right lateral recess narrowing without spinal canal or neural  foraminal stenosis.    L2-3: Mild disc bulging results in mild left lateral recess  narrowing without spinal canal or neural foraminal stenosis.    L3-4: Mild disc bulging and slight facet hypertrophy result in  minimal bilateral lateral recess narrowing without spinal canal or  neural foraminal stenosis.    L4-5: Moderate-sized left paracentral disc extrusion with caudal  migration to the L5 pedicle level and mild left facet hypertrophy  result in moderate left lateral recess stenosis,  potentially  affecting the left L5 nerve root. There is minimal right lateral  recess stenosis and mild overall spinal stenosis. Disc bulging  asymmetric to the left and disc space height loss contribute to  moderate left neural foraminal stenosis.    L5-S1: Mild disc bulging, small right foraminal/ extraforaminal disc  osteophyte complex, and mild facet hypertrophy result in mild  bilateral lateral recess stenosis and moderate right and mild left  neural foraminal stenosis. No spinal stenosis.    S1-2:  Mild facet arthrosis without disc herniation or stenosis.    IMPRESSION:  1. Transitional lumbosacral anatomy as above.  2. 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  3. Mild spinal stenosis and moderate left neural foraminal stenosis  at L4-5.  4. Moderate right neural foraminal  stenosis at L5-S1.      Electronically Signed  By: Aundra Lee M.D.  On: 04/21/2015 13:12            An electronic signature was used to authenticate this note.    --Daymon Evans, MD

## 2023-08-11 ENCOUNTER — Other Ambulatory Visit: Admit: 2023-08-11 | Discharge: 2023-08-11 | Payer: PRIVATE HEALTH INSURANCE | Primary: Family Medicine

## 2023-08-11 DIAGNOSIS — I10 Essential (primary) hypertension: Secondary | ICD-10-CM

## 2023-08-11 LAB — CBC WITH AUTO DIFFERENTIAL
Basophils %: 0.7 % (ref 0.0–2.0)
Basophils Absolute: 0 10*3/uL (ref 0.0–0.2)
Eosinophils %: 0 % (ref 0.0–7.0)
Eosinophils Absolute: 0 10*3/uL (ref 0.0–0.5)
Hematocrit: 37.2 % — ABNORMAL LOW (ref 38.0–52.0)
Hemoglobin: 12.5 g/dL — ABNORMAL LOW (ref 13.0–17.3)
Immature Grans (Abs): 0.01 10*3/uL (ref 0.00–0.06)
Immature Granulocytes %: 0.2 % (ref 0.0–0.6)
Lymphocytes Absolute: 2.1 10*3/uL (ref 1.0–3.2)
Lymphocytes: 34.6 % (ref 15.0–45.0)
MCH: 27.6 pg (ref 27.0–34.5)
MCHC: 33.6 g/dL (ref 30.0–36.0)
MCV: 82.1 fL — ABNORMAL LOW (ref 84.0–100.0)
MPV: 10.7 fL (ref 7.0–12.2)
Monocytes %: 4.3 % (ref 4.0–12.0)
Monocytes Absolute: 0.3 10*3/uL (ref 0.3–1.0)
NRBC Absolute: 0 10*3/uL (ref 0.000–0.012)
NRBC Automated: 0 % (ref 0.0–0.2)
Neutrophils %: 60.2 % (ref 42.0–74.0)
Neutrophils Absolute: 3.6 10*3/uL (ref 1.6–7.3)
Platelets: 255 10*3/uL (ref 140–440)
RBC: 4.53 x10e6/mcL (ref 4.00–5.60)
RDW: 13.9 % (ref 10.0–17.0)
WBC: 6 10*3/uL (ref 3.8–10.6)

## 2023-08-11 LAB — COMPREHENSIVE METABOLIC PANEL
ALT: 31 U/L (ref 0–42)
AST: 33 U/L (ref 0–46)
Albumin/Globulin Ratio: 2 (ref 1.00–2.70)
Albumin: 4.1 g/dL (ref 3.5–5.2)
Alk Phosphatase: 85 U/L (ref 40–130)
Anion Gap: 11 mmol/L (ref 2–17)
BUN: 26 mg/dL — ABNORMAL HIGH (ref 6–20)
CO2: 26 mmol/L (ref 22–29)
Calcium: 9.5 mg/dL (ref 8.5–10.7)
Chloride: 102 mmol/L (ref 98–107)
Creatinine: 1.2 mg/dL (ref 0.7–1.3)
Est, Glom Filt Rate: 72 mL/min/1.73mÂ² (ref >=60)
Globulin: 2.1 g/dL (ref 1.9–4.4)
Glucose: 156 mg/dL — ABNORMAL HIGH (ref 70–99)
Osmolaliy Calculated: 285 mosm/kg (ref 270–287)
Potassium: 4.4 mmol/L (ref 3.5–5.3)
Sodium: 139 mmol/L (ref 135–145)
Total Bilirubin: 0.28 mg/dL (ref 0.00–1.20)
Total Protein: 6.2 g/dL (ref 5.7–8.3)

## 2023-08-11 LAB — HEMOGLOBIN A1C
Estimated Avg Glucose: 252
Estimated Avg Glucose: 293
Hemoglobin A1C: 10.4 % — ABNORMAL HIGH (ref 4.0–6.0)

## 2023-08-11 LAB — LIPID PANEL
Chol/HDL Ratio: 3.3 (ref 0.0–4.4)
Cholesterol, Total: 112 mg/dL (ref 100–200)
HDL: 34 mg/dL — ABNORMAL LOW (ref >=40)
LDL Cholesterol: 63.8 mg/dL (ref 0.0–100.0)
LDL/HDL Ratio: 1.9
Triglycerides: 71 mg/dL (ref 0–149)
VLDL: 14.2 mg/dL (ref 5.0–40.0)

## 2023-08-11 LAB — ALBUMIN/CREATININE RATIO, URINE
Albumin Urine: 44.9 mg/dL — ABNORMAL HIGH (ref 0.0–20.0)
Albumin/Creatinine Ratio: 189 mg/g{creat} — ABNORMAL HIGH (ref 0–30)
Creatinine, Ur: 237 mg/dL (ref 39.0–259.0)

## 2023-08-11 LAB — TSH REFLEX TO FT4: TSH: 1.74 u[IU]/mL (ref 0.358–3.740)

## 2023-08-11 NOTE — Telephone Encounter (Signed)
 Called patient to discuss labs.  C-peptide low in the setting of high blood sugar with significantly positive gad antibodies.  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  He was directed to discontinue his metformin  and continue with insulin  injections.  He does continue to use his freestyle libre 3+ but does have difficulty with it falling off on occasion.  We discussed using a cover over the sensor to help it stay on his arm.  He is interested in moving forward with beta bionics insulin  pump.  I will start the process of getting that ordered for him.  I will plan to see him in July for follow-up.

## 2023-08-11 NOTE — Progress Notes (Signed)
 Venipuncture-[id number]:  Verbal consent obtained; patient tolerated procedure well; site cleansed with aseptic technique; pressure applied.  Location:l eft arm   Technique:21 g needle

## 2023-08-11 NOTE — Telephone Encounter (Signed)
 Waiting on labs results to finish process, then will place order

## 2023-08-11 NOTE — Telephone Encounter (Signed)
 Pt has been moved to 09/29/23 @ 2pm he also wants someone to call him about his results on his C peptide

## 2023-08-12 ENCOUNTER — Encounter

## 2023-08-12 NOTE — Telephone Encounter (Signed)
 Patient Harry Mitchell order placed.

## 2023-08-12 NOTE — Other (Signed)
 Normal thyroid function - other labs ordered by endo show a new microcytic anemia. Microcytic anemia (meaning red blood cells are small) is concerning for iron deficiency, which can happen from bleeding.  Will need to eval this further - discussed importance of completing cologuard, please remind patient. Otherwise will need to recheck nonfasting labs in a month with follow-up with me or stacy after to discuss and further evaluate this.  It also shows uncontrolled diabetes, A1C now back up to 10.4 - will need to follow-up with endo regarding potential adjustment in medication -  make sure to keep monitoring with CGM until appt next month and be careful with diet. Cholesterol is well controlled.

## 2023-08-16 NOTE — Telephone Encounter (Signed)
 Error

## 2023-08-19 ENCOUNTER — Ambulatory Visit
Admit: 2023-08-19 | Discharge: 2023-08-19 | Payer: PRIVATE HEALTH INSURANCE | Attending: Orthopaedic Surgery | Primary: Family Medicine

## 2023-08-19 VITALS — Ht 68.0 in | Wt 202.0 lb

## 2023-08-19 DIAGNOSIS — M5126 Other intervertebral disc displacement, lumbar region: Secondary | ICD-10-CM

## 2023-08-19 NOTE — Progress Notes (Signed)
 08/19/2023     Harry Mitchell    1970/07/16      53 y.o.    male   [address]  [place] [address]   Phone: [phone] (home)   PCP: Daymon Evans, MD   Insurance: Payor: [employer] / Plan: [facility] / Product Type: *No Product type* /       Chief Complaint   Patient presents with    Follow-up     Back  WC DOI:  12/13/2022  MR Lumbar  12/22/2022  [facility] PT  L4-5  Argyle Kurk:  06/09/2023, 06/23/2023            History of Present Illness:  Patient seen at this time in follow-up.  He was last seen by me May 1.  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  Overall he is stable.  He is followed for disc abnormality of the lumbar spine.  He normally has to lift and carry up to 50 pounds for short medium distances.  He has been able to perform this.  He has some intermittent soreness as expected but his symptoms are significantly improved from that of prior to his injections as these were performed at L4-5 level early April in mid April of this year.    Vitals: Ht 1.727 m (5' 8)   Wt 91.6 kg (202 lb)   BMI 30.71 kg/m      Current Outpatient Medications   Medication Sig Dispense Refill    Continuous Glucose Sensor (FREESTYLE LIBRE 3 PLUS SENSOR) MISC Change sensor every 15 days 6 each 1    insulin  lispro, 1 Unit Dial , (HUMALOG  KWIKPEN) 100 UNIT/ML SOPN Take 6-8 units with breakfast and dinner. MAX TDD: 16 units 5 Adjustable Dose Pre-filled Pen Syringe 3    sildenafil  (REVATIO ) 20 MG tablet Take 1 tablet by mouth daily as needed (ED; take 30 min prior to sexual activity) 10 tablet 3    hydroCHLOROthiazide  (HYDRODIURIL ) 25 MG tablet Take 1 tablet by mouth daily 90 tablet 1    gabapentin  (NEURONTIN ) 300 MG capsule Take 1 capsule by mouth 2 times daily for 180 days. 60 capsule 5    FLUoxetine  (PROZAC ) 40 MG capsule Take 1 capsule by mouth daily 90 capsule 1    atorvastatin  (LIPITOR) 40 MG tablet Take 1 tablet by mouth daily 90  tablet 1    insulin  glargine (LANTUS  SOLOSTAR) 100 UNIT/ML injection pen Inject 45-50 Units into the skin daily 5 mL 1    ibuprofen  (ADVIL ;MOTRIN ) 800 MG tablet Take 1 tablet by mouth every 8 hours as needed for Pain 90 tablet 2    albuterol  sulfate HFA (VENTOLIN  HFA) 108 (90 Base) MCG/ACT inhaler Inhale 2 puffs into the lungs 4 times daily as needed for Wheezing 18 g 0    losartan  (COZAAR ) 100 MG tablet Take 1 tablet by mouth daily 90 tablet 1    metFORMIN  (GLUCOPHAGE -XR) 500 MG extended release tablet Take 2 tablets by mouth with breakfast and with evening meal 360 tablet 1    Insulin  Pen Needle 32G X 6 MM MISC Use one needle per injection -has 4 injections / day 400 each 3    Insulin  Pen Needle (PEN NEEDLES) 32G X 6 MM MISC 1 each by Does not apply route 2 times daily 200 each 3     No current facility-administered medications for this visit.  Allergies   Allergen Reactions    Sulfa Antibiotics      REACTION: pt. can't remember reaction        Social History     Tobacco Use    Smoking status: Former     Current packs/day: 0.00     Average packs/day: 1 pack/day for 14.3 years (14.3 ttl pk-yrs)     Types: Cigarettes     Start date: 03/09/2007     Quit date: 07/06/2021     Years since quitting: 2.1    Smokeless tobacco: Never   Substance Use Topics    Alcohol use: Not Currently    Drug use: Not Currently     Types: Methamphetamines (Crystal Meth)          General Examination:   Exam today reveals improved range of motion of the lumbar spine in flexion as well as extension.  Reflexes are 1+ in the patella and Achilles tendons.  Sensory exam is intact in the L4-L5 and S1 dermatome distribution to light touch and pinprick.    Diagnosis:     ICD-10-CM    1. 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  I would estimate his impairment to be 10% impairment of the spine as it relates to his disc abnormalities lumbar spine.  He is released  without restriction.  He has no retained hardware.  Future needs will be physical therapy sessions 10 to 12/year as needed, as well as lumbar epidural injections 3 to 4/year as needed.  Patient will be released and seen in follow-up as needed.      Return if symptoms worsen or fail to improve.      Electronically signed by Jacqueline Matsu, MD on 08/19/23 at 9:47 AM EDT

## 2023-08-30 ENCOUNTER — Other Ambulatory Visit: Admit: 2023-08-30 | Discharge: 2023-08-30 | Payer: PRIVATE HEALTH INSURANCE | Primary: Family Medicine

## 2023-08-30 DIAGNOSIS — D649 Anemia, unspecified: Secondary | ICD-10-CM

## 2023-08-30 LAB — CBC WITH AUTO DIFFERENTIAL
Basophils %: 0.4 % (ref 0.0–2.0)
Basophils Absolute: 0 10*3/uL (ref 0.0–0.2)
Eosinophils %: 0 % (ref 0.0–7.0)
Eosinophils Absolute: 0 10*3/uL (ref 0.0–0.5)
Hematocrit: 41.3 % (ref 38.0–52.0)
Hemoglobin: 13.6 g/dL (ref 13.0–17.3)
Immature Grans (Abs): 0.02 10*3/uL (ref 0.00–0.06)
Immature Granulocytes %: 0.2 % (ref 0.0–0.6)
Lymphocytes Absolute: 2 10*3/uL (ref 1.0–3.2)
Lymphocytes: 20.2 % (ref 15.0–45.0)
MCH: 27.2 pg (ref 27.0–34.5)
MCHC: 32.9 g/dL (ref 30.0–36.0)
MCV: 82.6 fL — ABNORMAL LOW (ref 84.0–100.0)
MPV: 10.4 fL (ref 7.0–12.2)
Monocytes %: 4.9 % (ref 4.0–12.0)
Monocytes Absolute: 0.5 10*3/uL (ref 0.3–1.0)
NRBC Absolute: 0 10*3/uL (ref 0.000–0.012)
NRBC Automated: 0 % (ref 0.0–0.2)
Neutrophils %: 74.3 % — ABNORMAL HIGH (ref 42.0–74.0)
Neutrophils Absolute: 7.5 10*3/uL — ABNORMAL HIGH (ref 1.6–7.3)
Platelets: 323 10*3/uL (ref 140–440)
RBC: 5 x10e6/mcL (ref 4.00–5.60)
RDW: 13.4 % (ref 10.0–17.0)
WBC: 10.1 10*3/uL (ref 3.8–10.6)

## 2023-08-30 LAB — VITAMIN B12: Vitamin B-12: 633 pg/mL (ref 232–1245)

## 2023-08-30 LAB — FOLATE: Folate: 11 ng/mL (ref 4.80–24.20)

## 2023-08-30 LAB — IRON AND TIBC
Iron % Saturation: 16 % — ABNORMAL LOW (ref 20–40)
Iron: 57 ug/dL — ABNORMAL LOW (ref 59–158)
TIBC: 364 ug/dL (ref 250–450)
UIBC: 307 ug/dL (ref 112.0–347.0)

## 2023-08-30 LAB — FERRITIN: Ferritin: 56.3 ng/mL (ref 30.0–400.0)

## 2023-08-30 NOTE — Progress Notes (Signed)
 Venipuncture-[id number]:  Verbal consent obtained; patient tolerated procedure well; site cleansed with aseptic technique; pressure applied.  Location:l eft arm   Technique:21 g needle

## 2023-09-07 ENCOUNTER — Ambulatory Visit
Admit: 2023-09-07 | Discharge: 2023-09-07 | Payer: PRIVATE HEALTH INSURANCE | Attending: Family Medicine | Primary: Family Medicine

## 2023-09-07 DIAGNOSIS — D509 Iron deficiency anemia, unspecified: Principal | ICD-10-CM

## 2023-09-07 LAB — AMB POC HEMOGLOBIN A1C: Hemoglobin A1C, POC: 10.2 %

## 2023-09-07 MED ORDER — FERROUS SULFATE 325 (65 FE) MG PO TABS
325 | ORAL_TABLET | Freq: Every day | ORAL | 1 refills | Status: AC
Start: 2023-09-07 — End: ?

## 2023-09-07 NOTE — Telephone Encounter (Signed)
 Hey wanted to reach out about jardiance  for him. Looks like he had been on 25mg  previously but he discontinued it himself around February. 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 Ma/Cr ratio was 196 this time, prior to initiation of jardiance  last year had been 206. Any objections?

## 2023-09-07 NOTE — Progress Notes (Signed)
 Harry Mitchell [place] (DOB:  [birth year]) is a 53 y.o. male,Established patient, here for evaluation of the following chief complaint(s):  Follow-up      ASSESSMENT/PLAN:      ICD-10-CM    1. Iron deficiency anemia, unspecified iron deficiency anemia type  D50.9 Harry Rush, DO - Gastroenterology     ferrous sulfate  (IRON 325) 325 (65 Fe) MG tablet      2. LADA (latent autoimmune diabetes in adults), managed as type 1 (HCC)  E13.9       3. 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 Family history of colon cancer  Z80.0         Assessment & Plan  1. Type 1 Diabetes Mellitus.  - Advised to manage condition as a type 1 diabetic, necessitating the use of insulin .  - Urine test revealed microalbumin, indicating proteinuria likely due to diabetes.  - Jardiance  was discontinued in January 2025 but discussed has independent benefit in chronic kidney disease outside of diabetes that likely would still be beneficial with microalbuminuria.   - A message will be sent to the endocrinologist to discuss indication for jardiance  to be restarted or not.     2. Anemia.  - Hemoglobin level improved from 12.5 to 13.6, but iron level remains slightly low.  - Possible gastrointestinal bleeding indicated; no recurrence since last test.  - Referral for colonoscopy due to family history of colon cancer.  - Iron supplement prescribed; advised to monitor for constipation and dark stools, and reduce frequency if constipation occurs.    3. Diabetic foot care.  - Basic foot exam conducted today; no sores or signs of fungal infection.  - Advised to be cautious when shaving feet due to diabetes.  - Referral to a podiatrist can be made if needed.    4. Health Maintenance.  - Advised to receive pneumonia vaccine due to history of diabetes and age over 59.  - Follow-up scheduled for December 2025.      Return for as scheduled in Dec  .    [id number] Billed for moderate complexity with management in 3+ chronic  conditions, change in medications, orders placed for further evaluation.       SUBJECTIVE/OBJECTIVE:  HPI    Harry Mitchell is a 53 y.o. male  that presents for follow-up.    History of Present Illness  The patient is a 53 year old male here for follow-up blood work. He was seen about a month ago and had repeat labs with elevated A1c and new anemia.    He has been diagnosed with type 1 diabetes and is currently not on metformin . He is in the process of obtaining an insulin  pump. He experiences high blood sugar levels throughout the day, particularly at midnight. He has previously taken Jardiance  without any side effects but discontinued it, mistakenly thinking it was only for his sugar levels. He believes he still has an active prescription for Jardiance . He has not been taking Jardiance  since 04/2023.    He has noticed a slight decrease in his iron levels. His hemoglobin level had dropped to 12.5 but has since improved to 13.6. He plans to submit a Cologuard test tomorrow. He has observed oily stools, which his girlfriend suggested might be related to pancreatic issues. He has a family history of colon cancer, specifically his maternal grandmother, and has never undergone a colonoscopy. He used to experience constipation when following a high-protein diet. He also notes  that dehydration tends to increase his constipation.    He reports no changes in his foot condition, attributing any issues to circulation problems. He has calluses on his feet, which he believes are caused by his boots. These calluses recur even after shaving them off.    SOCIAL HISTORY  He has a history of smoking.    FAMILY HISTORY  His maternal grandmother had colon cancer.          CHRONIC HX/PRIOR HX:     HTN- Currently taking HCTZ and Losartan  for BP control.  Denies any s/e of use.  BP appropriate. Due for labs     HLD- Currently taking 40 mg atorvastatin . No medication s/e. Overdue for labs.     Anxiety/depression- Currently taking 40  Prozac .  Patient reports he has been on same medication for a few years.  He has inadvertently stopped medication and had significant return in symptoms at that time. Currently well controlled.    DM2-  Uncontrolled. Currently taking Metformin  1000mg  BID, jardiance  25mg   and about lantus  - last adjusted 1/25 with lantus  35U daily and short acting insulin  - approx 6U 2-3x/day . Home sugars have fluctuated - notes some higher levels when he had steroid shots, some recent trouble with phone / libra connectivity.      Back pain - had injury at work causing lumbar back pain.  Has been seeing Dr Harry Mitchell, had LESI x 2 in April, some improvement but still residual pain.     Patient with history of substance abuse.  Has been clean since moving to [facility] to get his life back on track.  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    Immunization History   Administered Date(s) Administered    COVID-19, MODERNA BLUE border, Primary or Immunocompromised, (age 12y+), IM, 100 mcg/0.5mL 11/08/2019    Influenza Virus Vaccine 12/03/2019, 12/11/2020    Influenza, FLUARIX, FLULAVAL, FLUZONE (age 54 mo+) and AFLURIA, (age 65 y+), Quadv PF, 0.5mL 03/31/2022    PPD Test 07/12/2011    Pneumococcal, PPSV23, PNEUMOVAX 23, (age 2y+), SC/IM, 0.5mL 08/24/2010    TD 2LF, TDVAX, (age 7y+), IM, 0.5mL 05/04/2006    TDaP, ADACEL (age 70y-64y), MYRTICE (age 10y+), IM, 0.5mL 05/30/2017    Zoster Recombinant (Shingrix) 12/11/2020           Current Outpatient Medications   Medication Sig Dispense Refill    ferrous sulfate  (IRON 325) 325 (65 Fe) MG tablet Take 1 tablet by mouth daily (with breakfast) 90 tablet 1    Continuous Glucose Sensor (FREESTYLE LIBRE 3 PLUS SENSOR) MISC Change sensor every 15 days 6 each 1    insulin  lispro, 1 Unit Dial , (HUMALOG  KWIKPEN) 100 UNIT/ML SOPN Take 6-8 units with breakfast and dinner. MAX TDD: 16 units 5 Adjustable Dose Pre-filled Pen Syringe 3    sildenafil  (REVATIO ) 20 MG tablet Take 1  tablet by mouth daily as needed (ED; take 30 min prior to sexual activity) 10 tablet 3    losartan  (COZAAR ) 100 MG tablet Take 1 tablet by mouth daily 90 tablet 1    hydroCHLOROthiazide  (HYDRODIURIL ) 25 MG tablet Take 1 tablet by mouth daily 90 tablet 1    gabapentin  (NEURONTIN ) 300 MG capsule Take 1 capsule by mouth 2 times daily for 180 days. 60 capsule 5    FLUoxetine  (PROZAC ) 40 MG capsule Take 1 capsule by mouth daily 90 capsule 1    atorvastatin  (LIPITOR) 40 MG tablet Take 1 tablet by  mouth daily 90 tablet 1    Insulin  Pen Needle 32G X 6 MM MISC Use one needle per injection -has 4 injections / day 400 each 3    Insulin  Pen Needle (PEN NEEDLES) 32G X 6 MM MISC 1 each by Does not apply route 2 times daily 200 each 3    insulin  glargine (LANTUS  SOLOSTAR) 100 UNIT/ML injection pen Inject 45-50 Units into the skin daily 5 mL 1    ibuprofen  (ADVIL ;MOTRIN ) 800 MG tablet Take 1 tablet by mouth every 8 hours as needed for Pain 90 tablet 2    albuterol  sulfate HFA (VENTOLIN  HFA) 108 (90 Base) MCG/ACT inhaler Inhale 2 puffs into the lungs 4 times daily as needed for Wheezing 18 g 0     No current facility-administered medications for this visit.        Prior to Admission medications    Medication Sig Start Date End Date Taking? Authorizing Provider   ferrous sulfate  (IRON 325) 325 (65 Fe) MG tablet Take 1 tablet by mouth daily (with breakfast) 09/07/23  Yes [name], MD   Continuous Glucose Sensor (FREESTYLE LIBRE 3 PLUS SENSOR) MISC Change sensor every 15 days 08/10/23   Michael Longs, PA   insulin  lispro, 1 Unit Dial , (HUMALOG  KWIKPEN) 100 UNIT/ML SOPN Take 6-8 units with breakfast and dinner. MAX TDD: 16 units 08/10/23   Michael Longs, PA   sildenafil  (REVATIO ) 20 MG tablet Take 1 tablet by mouth daily as needed (ED; take 30 min prior to sexual activity) 08/10/23   [name], MD   losartan  (COZAAR ) 100 MG tablet Take 1 tablet by mouth daily 08/10/23   [name], MD   hydroCHLOROthiazide  (HYDRODIURIL ) 25 MG tablet Take  1 tablet by mouth daily 08/10/23 02/06/24  [name], MD   gabapentin  (NEURONTIN ) 300 MG capsule Take 1 capsule by mouth 2 times daily for 180 days. 08/10/23 02/06/24  [name], MD   FLUoxetine  (PROZAC ) 40 MG capsule Take 1 capsule by mouth daily 08/10/23   [name], MD   atorvastatin  (LIPITOR) 40 MG tablet Take 1 tablet by mouth daily 08/10/23 02/06/24  [name], MD   Insulin  Pen Needle 32G X 6 MM MISC Use one needle per injection -has 4 injections / day 08/10/23   [name], MD   Insulin  Pen Needle (PEN NEEDLES) 32G X 6 MM MISC 1 each by Does not apply route 2 times daily 08/10/23   Michael Longs, PA   insulin  glargine (LANTUS  SOLOSTAR) 100 UNIT/ML injection pen Inject 45-50 Units into the skin daily 08/10/23   Michael Longs, PA   ibuprofen  (ADVIL ;MOTRIN ) 800 MG tablet Take 1 tablet by mouth every 8 hours as needed for Pain 02/09/23   [name], MD   albuterol  sulfate HFA (VENTOLIN  HFA) 108 (90 Base) MCG/ACT inhaler Inhale 2 puffs into the lungs 4 times daily as needed for Wheezing 02/09/23   [name], MD       Past Medical History:   Diagnosis Date    Hyperlipidemia     Hypertension     Type 2 diabetes mellitus without complication [facility])         Past Surgical History:   Procedure Laterality Date    ANTERIOR CRUCIATE LIGAMENT REPAIR Right     FINGER AMPUTATION Left     Left Long finger    PAIN MANAGEMENT PROCEDURE N/A 06/09/2023    WC LESI L4-5 performed by [name] Homer  W, MD at [facility] PAIN MANAGEMENT     PAIN MANAGEMENT PROCEDURE N/A 06/23/2023    WC/LESI #2; L4-5 performed by [name] Ronalee ORN, MD at [facility] PAIN MANAGEMENT        REVIEW OF SYSTEMS:   ROS as noted in HPI, otherwise negative       BP 120/76   Pulse 72   Ht 1.727 m (5' 8)   Wt 90.3 kg (199 lb)   SpO2 98%   BMI 30.26 kg/m      PHYSICAL EXAM:  General appearance - alert, well appearing, and in no distress  Neck - normal ROM, no cervical LAD, no thyroid enlargement or nodularity  Mouth - mucous membranes moist, poor dentition  Chest -  clear to auscultation, no wheezes, normal work of breathing  Heart - normal rate and regular rhythm, no murmurs noted  Abd - soft, NTND, +BS x 4 quadrants   Neurological - alert, normal speech, no focal findings  Extremities - peripheral pulses normal, no edema    Diabetic foot exam:  DONE 09/07/23   Left Foot:   Visual Exam: callous-     Pulse DP: 2+ (normal)   Filament test: normal sensation   Vibratory Sensation: normal  Right Foot:   Visual Exam: callous-     Pulse DP: 2+ (normal)   Filament test: normal sensation   Vibratory Sensation: normal         Results for orders placed or performed in visit on 09/07/23   POC Glycated Hemoglobin (HBA1C) Test (16963)   Result Value Ref Range    Hemoglobin A1C, POC 10.2 %            Results for orders placed or performed in visit on 09/07/23 (from the past 2160 hours)   POC Glycated Hemoglobin (HBA1C) Test (16963)   Result Value Ref Range    Hemoglobin A1C, POC 10.2 %   Results for orders placed or performed in visit on 08/30/23 (from the past 2160 hours)   CBC with Auto Differential   Result Value Ref Range    WBC 10.1 3.8 - 10.6 x10e3/mcL    RBC 5.00 4.00 - 5.60 x10e6/mcL    Hemoglobin 13.6 13.0 - 17.3 g/dL    Hematocrit 58.6 61.9 - 52.0 %    MCV 82.6 (L) 84.0 - 100.0 fL    MCH 27.2 27.0 - 34.5 pg    MCHC 32.9 30.0 - 36.0 g/dL    RDW 86.5 89.9 - 82.9 %    Platelets 323 140 - 440 x10e3/mcL    MPV 10.4 7.0 - 12.2 fL    NRBC Automated 0.0 0.0 - 0.2 %    NRBC Absolute 0.000 0.000 - 0.012 x10e3/mcL    Neutrophils % 74.3 (H) 42.0 - 74.0 %    Lymphocytes 20.2 15.0 - 45.0 %    Monocytes % 4.9 4.0 - 12.0 %    Eosinophils % 0.0 0.0 - 7.0 %    Basophils % 0.4 0.0 - 2.0 %    Neutrophils Absolute 7.5 (H) 1.6 - 7.3 x10e3/mcL    Lymphocytes Absolute 2.0 1.0 - 3.2 x10e3/mcL    Monocytes Absolute 0.5 0.3 - 1.0 x10e3/mcL    Eosinophils Absolute 0.0 0.0 - 0.5 x10e3/mcL    Basophils Absolute 0.0 0.0 - 0.2 x10e3/mcL    Immature Granulocytes % 0.2 0.0 - 0.6 %    Immature Grans (Abs) 0.02 0.00 -  0.06 x10e3/mcL   Iron and TIBC   Result Value  Ref Range    Iron 57 (L) 59 - 158 mcg/dL    UIBC 692.9 887.9 - 652.9 mcg/dL    TIBC 635 749 - 549 mcg/dL    Iron % Saturation 16 (L) 20 - 40 %   Vitamin B12   Result Value Ref Range    Vitamin B-12 633 232 - 1245 pg/mL   Folate   Result Value Ref Range    Folate 11.00 4.80 - 24.20 ng/mL   Ferritin   Result Value Ref Range    Ferritin 56.3 30.0 - 400.0 ng/mL   Results for orders placed or performed in visit on 08/11/23 (from the past 2160 hours)   TSH reflex to FT4   Result Value Ref Range    TSH 1.740 0.358 - 3.740 mcIU/mL   Comprehensive Metabolic Panel   Result Value Ref Range    Sodium 139 135 - 145 mmol/L    Potassium 4.4 3.5 - 5.3 mmol/L    Chloride 102 98 - 107 mmol/L    CO2 26 22 - 29 mmol/L    Glucose 156 (H) 70 - 99 mg/dL    BUN 26 (H) 6 - 20 mg/dL    Creatinine 1.2 0.7 - 1.3 mg/dL    Anion Gap 11 2 - 17 mmol/L    Osmolaliy Calculated 285 270 - 287 mOsm/kg    Calcium  9.5 8.5 - 10.7 mg/dL    Total Protein 6.2 5.7 - 8.3 g/dL    Albumin 4.1 3.5 - 5.2 g/dL    Globulin 2.1 1.9 - 4.4 g/dL    Albumin/Globulin Ratio 2.00 1.00 - 2.70    Total Bilirubin 0.28 0.00 - 1.20 mg/dL    Alk Phosphatase 85 40 - 130 unit/L    AST 33 0 - 46 unit/L    ALT 31 0 - 42 unit/L    Est, Glom Filt Rate 72 >=60 mL/min/1.29m   CBC with Auto Differential   Result Value Ref Range    WBC 6.0 3.8 - 10.6 x10e3/mcL    RBC 4.53 4.00 - 5.60 x10e6/mcL    Hemoglobin 12.5 (L) 13.0 - 17.3 g/dL    Hematocrit 62.7 (L) 38.0 - 52.0 %    MCV 82.1 (L) 84.0 - 100.0 fL    MCH 27.6 27.0 - 34.5 pg    MCHC 33.6 30.0 - 36.0 g/dL    RDW 86.0 89.9 - 82.9 %    Platelets 255 140 - 440 x10e3/mcL    MPV 10.7 7.0 - 12.2 fL    NRBC Automated 0.0 0.0 - 0.2 %    NRBC Absolute 0.000 0.000 - 0.012 x10e3/mcL    Neutrophils % 60.2 42.0 - 74.0 %    Lymphocytes 34.6 15.0 - 45.0 %    Monocytes % 4.3 4.0 - 12.0 %    Eosinophils % 0.0 0.0 - 7.0 %    Basophils % 0.7 0.0 - 2.0 %    Neutrophils Absolute 3.6 1.6 - 7.3 x10e3/mcL     Lymphocytes Absolute 2.1 1.0 - 3.2 x10e3/mcL    Monocytes Absolute 0.3 0.3 - 1.0 x10e3/mcL    Eosinophils Absolute 0.0 0.0 - 0.5 x10e3/mcL    Basophils Absolute 0.0 0.0 - 0.2 x10e3/mcL    Immature Granulocytes % 0.2 0.0 - 0.6 %    Immature Grans (Abs) 0.01 0.00 - 0.06 x10e3/mcL   Hemoglobin A1C   Result Value Ref Range    Hemoglobin A1C 10.4 (H) 4.0 - 6.0 %    Estimated Avg Glucose  252     Estimated Avg Glucose 293    Lipid Panel   Result Value Ref Range    Cholesterol, Total 112 100 - 200 mg/dL    HDL 34 (L) >=59 mg/dL    Triglycerides 71 0 - 149 mg/dL    LDL Cholesterol 36.1 0.0 - 100.0 mg/dL    LDL/HDL Ratio 1.9     Chol/HDL Ratio 3.3 0.0 - 4.4    VLDL 14.2 5.0 - 40.0 mg/dL   Albumin/Creatinine Ratio, Urine   Result Value Ref Range    Albumin Urine 44.9 (H) 0.0 - 20.0 mg/dL    Creatinine, Ur 762.9 39.0 - 259.0 mg/dL    Albumin/Creatinine Ratio 189 (H) 0 - 30 mg/g Cr       Hemoglobin A1C   Date Value Ref Range Status   08/11/2023 10.4 (H) 4.0 - 6.0 % Final     Comment:     HEMOGLOBIN A1C INTERPRETATION:    The following arbitrary ranges may be used for interpretation of the results.  However, factors such as duration of diabetes, adherence to therapy, and  patient age should also be considered in assessing degree of blood glucose  control.    Hemoglobin A1C                 Avg. Blood Sugar  --------------------------------------------------------------  6%                           135 mg/dL  7%                           170 mg/dL  8%                           205 mg/dL  9%                           240 mg/dL  89%                          275 mg/dL    ======================================================    A1C                      Glucose Control  ----------------------------------------------------------------  < 6.0 %                   Normal  6.0 - 6.9 %               Abnormal  7.0 - 7.9 %               Sub-Optimal Control  > 8.0 %                   Inadequate Control           Lab Results   Component Value  Date    NA 139 08/11/2023    K 4.4 08/11/2023    CL 102 08/11/2023    CO2 26 08/11/2023    BUN 26 (H) 08/11/2023    CREATININE 1.2 08/11/2023    GLUCOSE 156 (H) 08/11/2023    CALCIUM  9.5 08/11/2023    BILITOT 0.28 08/11/2023    ALKPHOS 85 08/11/2023    AST 33 08/11/2023    ALT 31 08/11/2023    LABGLOM 72 08/11/2023  GLOB 2.1 08/11/2023     Lab Results   Component Value Date    CHOL 112 08/11/2023    TRIG 71 08/11/2023    HDL 34 (L) 08/11/2023    LDL 63.8 08/11/2023    VLDL 14.2 08/11/2023    CHOLHDLRATIO 3.3 08/11/2023     Lab Results   Component Value Date    PSA 2.020 11/01/2022         MRI Result (most recent):  MRI LUMBAR SPINE WO CONTRAST 04/21/2015    Narrative  CLINICAL DATA:  Lumbar pain with left leg radiculopathy in an L5  distribution. Hyperextension injury 09/2014.    EXAM:  MRI LUMBAR SPINE WITHOUT CONTRAST    TECHNIQUE:  Multiplanar, multisequence MR imaging of the lumbar spine was  performed. No intravenous contrast was administered.    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  Numbering preserved from that study, with the transitional segment  considered a nearly completely lumbarized S1.    Vertebral alignment is normal. Vertebral body heights are preserved.  A small L1 superior endplate Schmorl's node is noted. Moderate disc  space height loss and mild degenerative endplate edema are present  at L4-5. Disc desiccation is noted at L4-5 and L5-S1. There is also  moderate disc space height loss at L1-2. Conus medullaris is normal  in signal and terminates at the mid to upper L2 level. Paraspinal  soft tissues are unremarkable.    L1-2: Mild circumferential disc bulging results in mild left greater  than right lateral recess narrowing without spinal canal or neural  foraminal stenosis.    L2-3: Mild disc bulging results in mild left lateral recess  narrowing without spinal canal or neural foraminal  stenosis.    L3-4: Mild disc bulging and slight facet hypertrophy result in  minimal bilateral lateral recess narrowing without spinal canal or  neural foraminal stenosis.    L4-5: Moderate-sized left paracentral disc extrusion with caudal  migration to the L5 pedicle level and mild left facet hypertrophy  result in moderate left lateral recess stenosis, potentially  affecting the left L5 nerve root. There is minimal right lateral  recess stenosis and mild overall spinal stenosis. Disc bulging  asymmetric to the left and disc space height loss contribute to  moderate left neural foraminal stenosis.    L5-S1: Mild disc bulging, small right foraminal/ extraforaminal disc  osteophyte complex, and mild facet hypertrophy result in mild  bilateral lateral recess stenosis and moderate right and mild left  neural foraminal stenosis. No spinal stenosis.    S1-2:  Mild facet arthrosis without disc herniation or stenosis.    IMPRESSION:  1. Transitional lumbosacral anatomy as above.  2. 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  3. Mild spinal stenosis and moderate left neural foraminal stenosis  at L4-5.  4. Moderate right neural foraminal stenosis at L5-S1.      Electronically Signed  By: Dasie Hamburg M.D.  On: 04/21/2015 13:12            An electronic signature was used to authenticate this note.    --Vena CHRISTELLA Burnet, MD

## 2023-09-29 ENCOUNTER — Telehealth
Admit: 2023-09-29 | Discharge: 2023-09-29 | Payer: PRIVATE HEALTH INSURANCE | Attending: Physician Assistant | Primary: Family Medicine

## 2023-09-29 DIAGNOSIS — E1165 Type 2 diabetes mellitus with hyperglycemia: Principal | ICD-10-CM

## 2023-09-29 DIAGNOSIS — Z794 Long term (current) use of insulin: Secondary | ICD-10-CM

## 2023-09-29 NOTE — Progress Notes (Signed)
 09/29/2023    TELEHEALTH EVALUATION -- Audio/Visual (During COVID-19 public health emergency)    History of Present Illness  The patient is seen in a virtual visit follow-up today for diabetes. 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    He is scheduled to start [employer] insulin  pump training on Monday. Blood glucose levels have been fluctuating, with instances of both high and low readings. He has been experiencing issues with his [name] 3 device staying on his skin, which he believes are now resolved. He has been purchasing two additional devices monthly outside of his insurance coverage. The device was often falling off by the end of the day, but he has been using a Band-Aid type product on it, which seems to be effective for the past four days. He also received some samples from [name]. He attempted to use Tegaderm, which was initially effective but later failed. His sister provided him with some bandages and samples. He also has a wrap that can be used around the device, but he has not yet tried it.    He has been adjusting his insulin  dosage based on his blood sugar levels, taking 6 to 8 units Humalog  at mealtime and increasing the dose to 10 units when his blood sugar is high. He takes 45 -50 units Lantus  daily.   I have downloaded and reviewed patient's CGM. Continuous glucose monitoring is from date July 11 through September 29, 2023  Looking at the trend starting at midnight blood sugars are elevated  Postprandial level are elevated  Hypoglycemic episodes rare  Average glucose is 305 with 12% time in range and 1% hypoglycemia.  GMI 10.7%  Most recent A1c done at [name] 10.4%      Prior to Visit Medications    Medication Sig Taking? Authorizing Provider   ferrous sulfate  (IRON 325) 325 (65 Fe) MG tablet Take 1 tablet by mouth daily (with breakfast)  [name], MD   Continuous Glucose Sensor (FREESTYLE  LIBRE 3 PLUS SENSOR) MISC Change sensor every 15 days  [name], [name], [place]   insulin  lispro, 1 Unit Dial , (HUMALOG  KWIKPEN) 100 UNIT/ML SOPN Take 6-8 units with breakfast and dinner. MAX TDD: 16 units  [name], [place], [place]   sildenafil  (REVATIO ) 20 MG tablet Take 1 tablet by mouth daily as needed (ED; take 30 min prior to sexual activity)  [name], MD   losartan  (COZAAR ) 100 MG tablet Take 1 tablet by mouth daily  [name], MD   hydroCHLOROthiazide  (HYDRODIURIL ) 25 MG tablet Take 1 tablet by mouth daily  [name], MD   gabapentin  (NEURONTIN ) 300 MG capsule Take 1 capsule by mouth 2 times daily for 180 days.  [name], MD   FLUoxetine  (PROZAC ) 40 MG capsule Take 1 capsule by mouth daily  [name], MD   atorvastatin  (LIPITOR) 40 MG tablet Take 1 tablet by mouth daily  [name], MD   Insulin  Pen Needle 32G X 6 MM MISC Use one needle per injection -has 4 injections / day  [name], MD   Insulin  Pen Needle (PEN NEEDLES) 32G X 6 MM MISC 1 each by Does not apply route 2 times daily  [name], [place]   insulin  glargine (LANTUS  SOLOSTAR) 100 UNIT/ML injection pen Inject 45-50 Units into the skin daily  Drumright, [place], [place]   ibuprofen  (ADVIL ;MOTRIN ) 800 MG tablet  Take 1 tablet by mouth every 8 hours as needed for Pain  [name], MD   albuterol  sulfate HFA (VENTOLIN  HFA) 108 (90 Base) MCG/ACT inhaler Inhale 2 puffs into the lungs 4 times daily as needed for Wheezing  [name], MD     Social History     Tobacco Use    Smoking status: Former     Current packs/day: 0.00     Average packs/day: 1 pack/day for 14.3 years (14.3 ttl pk-yrs)     Types: Cigarettes     Start date: 03/09/2007     Quit date: 07/06/2021     Years since quitting: 2.2    Smokeless tobacco: Never   Substance Use Topics    Alcohol use: Not Currently    Drug use: Not Currently     Types: Methamphetamines (Crystal Meth)     Family History   Problem Relation Age of Onset    Diabetes Brother     Colon Cancer Maternal Grandmother      Diabetes Paternal Grandfather     Diabetes Paternal Uncle      ROS: As per HPI, all others negative  There were no vitals taken for this visit.   PHYSICAL EXAMINATION:  Vital Signs: (As obtained by patient/caregiver or practitioner observation)      09/29/2023     2:11 PM   Patient-Reported Vitals   Patient-Reported Weight 190   Patient-Reported Height 5'8   Patient-Reported Systolic 124 mmHg   Patient-Reported Diastolic 78 mmHg       Blood pressure-  Heart rate-    Respiratory rate-    Temperature-  Pulse oximetry-     Constitutional: [x]  Appears well-developed and well-nourished []  No apparent distress      []  Abnormal-   Mental status  [x]  Alert and awake  [x]  Oriented to person/place/time [x] Able to follow commands      Eyes:  EOM    [x]   Normal  []  Abnormal-  Sclera  [x]   Normal  []  Abnormal -         Discharge [x]   None visible  []  Abnormal -    HENT:   [x]  Normocephalic, atraumatic.  []  Abnormal   [x]  Mouth/Throat: Mucous membranes are moist.     External Ears [x]  Normal  []  Abnormal-     Neck: [x]  No visualized mass     Pulmonary/Chest: [x]  Respiratory effort normal.  [x]  No visualized signs of difficulty breathing or respiratory distress        []  Abnormal-      Musculoskeletal:   []  Normal gait with no signs of ataxia         [x]  Normal range of motion of neck        []  Abnormal-       Neurological:        [x]  No Facial Asymmetry (Cranial nerve 7 motor function) (limited exam to video visit)          [x]  No gaze palsy        []  Abnormal-         Skin:        [x]  No significant exanthematous lesions or discoloration noted on facial skin         []  Abnormal-            Psychiatric:       [x]  Normal Affect []  No Hallucinations        []  Abnormal-  Other pertinent observable physical exam findings-       Recent Results (from the past 4 weeks)   POC Glycated Hemoglobin (HBA1C) Test [phone])    Collection Time: 09/07/23  9:00 AM   Result Value Ref Range    Hemoglobin A1C, POC 10.2 %        Assessment &  Plan  1. Type 1 Diabetes Mellitus.  - Blood glucose levels are consistently elevated, with occasional near-hypoglycemic episodes.  - A1c level was recorded as 10.4 in 08/2023, indicating poor glycemic control.  - Scheduled to start using a beta bionics insulin  pump, which is expected to significantly improve blood sugar management. Advised to bolus before meals and will receive training on the proper use of the pump.  - Advised to use Skin Grip or Simpatch to secure the continuous glucose monitor (CGM) sensor, as he has experienced issues with it falling off due to sweating. Monitoring kidney function closely due to the presence of microscopic protein in urine. Addition of medications like Jardiance  or Farxiga was discussed but deemed inappropriate due to the increased risk of euglycemic diabetic ketoacidosis in type 1 diabetes.    Follow-up: The patient will follow up in 3 months for an in-office appointment.       Attestation      1. Type 2 diabetes mellitus with hyperglycemia, with long-term current use of insulin  (HCC)  2. Essential hypertension, benign      [facility], was evaluated through a synchronous (real-time) audio-video encounter. The patient (or guardian if applicable) is aware that this is a billable service, which includes applicable co-pays. This Virtual Visit was conducted with patient's (and/or legal guardian's) consent. Patient identification was verified, and a caregiver was present when appropriate.   The patient was located at Home: [address]  [place] [address]  Provider was located at Home (Appt Dept State): [place]  Confirm you are appropriately licensed, registered, or certified to deliver care in the state where the patient is located as indicated above. If you are not or unsure, please re-schedule the visit: Yes, I confirm.       Return in about 3 months (around 12/30/2023) for follow up, glucose review, in office.    Total time spent on this encounter: Not billed by  time    The patient (or guardian, if applicable) and other individuals in attendance with the patient were advised that Artificial Intelligence will be utilized during this visit to record, process the conversation to generate a clinical note, and support improvement of the AI technology. The patient (or guardian, if applicable) and other individuals in attendance at the appointment consented to the use of AI, including the recording.      --Harry Opal, PA on 09/29/2023 at 2:32 PM

## 2023-10-05 ENCOUNTER — Telehealth

## 2023-10-05 MED ORDER — INSULIN LISPRO 100 UNIT/ML IJ SOLN
100 | INTRAMUSCULAR | 1 refills | Status: AC
Start: 2023-10-05 — End: ?

## 2023-10-05 NOTE — Telephone Encounter (Signed)
 Pt needs insulin  for insulin  pump, started on [employer] pump this week

## 2023-11-03 NOTE — Telephone Encounter (Signed)
 Did review further -  as mentioned my Erin technically not indicated for LADA but appears could still be considered for CKD; the risk of DKA appears to be fairly low and not necessarily contraindication. But, with microalbuminuria being less than 300 will hold off and monitor; potential consideration for future

## 2023-11-17 ENCOUNTER — Encounter: Attending: Physician Assistant | Primary: Family Medicine

## 2023-12-08 ENCOUNTER — Ambulatory Visit
Admit: 2023-12-08 | Discharge: 2023-12-08 | Payer: Worker's Compensation | Attending: Orthopaedic Surgery | Primary: Family Medicine

## 2023-12-08 VITALS — Ht 68.0 in | Wt 199.0 lb

## 2023-12-08 DIAGNOSIS — M5126 Other intervertebral disc displacement, lumbar region: Principal | ICD-10-CM

## 2023-12-08 NOTE — Addendum Note (Signed)
 Addended by: INABINETT, LAURA S on: 12/08/2023 11:48 AM     Modules accepted: Orders

## 2023-12-08 NOTE — Progress Notes (Signed)
 12/08/2023     Zarian Colpitts    27-Apr-1970      53 y.o.    male   [address]  [place] [address]   Phone: [phone] (home)   PCP: [name], MD   Insurance: Payor: [employer] / Plan: [facility] / Product Type: *No Product type* /       Chief Complaint   Patient presents with    Follow-up     Back  WC DOI:  12/13/2022  MR Lumbar  12/22/2022  [facility] PT  L4-5  GEOFFERY Counts:  06/09/2023, 06/23/2023        History of Present Illness:  Patient seen at this time in follow-up.  He is a 53 year old male who was rated and released by me June 13 of this year.  He is followed for disc abnormalities lumbar spine at the levels of L3-4 and L4-5 levels.  Patient is with was written for future needs being physical therapy as well as injections.    Of note is that he has in the interim had a insulin  pump placed and now his blood sugars are more well regulated and he states that recently over the last 40 to 60 days, his A1c is calculated to be around 7.1.  This was much better than early June 4 months ago as it was 10.4.    Patient is having some mild recurrence of lower back pain as well as some intermittent right sided buttock and posterior thigh pain.  He did well with previous injections April 3 and April 17 of this year with Dr. Counts.      Vitals: Ht 1.727 m (5' 8)   Wt 90.3 kg (199 lb)   BMI 30.26 kg/m      Current Outpatient Medications   Medication Sig Dispense Refill    insulin  lispro (HUMALOG ,ADMELOG ) 100 UNIT/ML SOLN injection vial Max TDD 90 units for continuous SQ infusion with insulin  pump 90 mL 1    ferrous sulfate  (IRON 325) 325 (65 Fe) MG tablet Take 1 tablet by mouth daily (with breakfast) 90 tablet 1    Continuous Glucose Sensor (FREESTYLE LIBRE 3 PLUS SENSOR) MISC Change sensor every 15 days 6 each 1    insulin  lispro, 1 Unit Dial , (HUMALOG  KWIKPEN) 100 UNIT/ML SOPN Take 6-8 units with breakfast and dinner. MAX TDD: 16 units 5 Adjustable Dose  Pre-filled Pen Syringe 3    sildenafil  (REVATIO ) 20 MG tablet Take 1 tablet by mouth daily as needed (ED; take 30 min prior to sexual activity) 10 tablet 3    losartan  (COZAAR ) 100 MG tablet Take 1 tablet by mouth daily 90 tablet 1    hydroCHLOROthiazide  (HYDRODIURIL ) 25 MG tablet Take 1 tablet by mouth daily 90 tablet 1    gabapentin  (NEURONTIN ) 300 MG capsule Take 1 capsule by mouth 2 times daily for 180 days. 60 capsule 5    FLUoxetine  (PROZAC ) 40 MG capsule Take 1 capsule by mouth daily 90 capsule 1    atorvastatin  (LIPITOR) 40 MG tablet Take 1 tablet by mouth daily 90 tablet 1    Insulin  Pen Needle 32G X 6 MM MISC Use one needle per injection -has 4 injections / day 400 each 3    Insulin  Pen Needle (PEN NEEDLES) 32G X 6 MM MISC 1 each by Does not apply route 2 times daily 200 each 3    insulin  glargine (LANTUS  SOLOSTAR) 100  UNIT/ML injection pen Inject 45-50 Units into the skin daily 5 mL 1    ibuprofen  (ADVIL ;MOTRIN ) 800 MG tablet Take 1 tablet by mouth every 8 hours as needed for Pain 90 tablet 2    albuterol  sulfate HFA (VENTOLIN  HFA) 108 (90 Base) MCG/ACT inhaler Inhale 2 puffs into the lungs 4 times daily as needed for Wheezing 18 g 0     No current facility-administered medications for this visit.        Allergies   Allergen Reactions    Sulfa Antibiotics      REACTION: pt. can't remember reaction        Social History     Tobacco Use    Smoking status: Former     Current packs/day: 0.00     Average packs/day: 1 pack/day for 14.3 years (14.3 ttl pk-yrs)     Types: Cigarettes     Start date: 03/09/2007     Quit date: 07/06/2021     Years since quitting: 2.4    Smokeless tobacco: Never   Substance Use Topics    Alcohol use: Not Currently    Drug use: Not Currently     Types: Methamphetamines (Crystal Meth)          General Examination:   Exam today reveals relatively good mobility of the lumbar spine in flexion as well as extension.  Sensory exam is slightly altered in the right L5 dermatome distribution to  light touch and pinprick.    MRI scan reveals evidence of left sided foraminal disc protrusion L3-4 level as well as right sided foraminal disc protrusion L4-5 level.  L4-5 level seems to be his symptomatic side.      Diagnosis:     ICD-10-CM    1. 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 [name].    Patient will continue with his usual work activities and his future needs will continue to be necessary per the rate and release note August 19, 2023.  Patient will be seen in follow-up as needed.      Return if symptoms worsen or fail to improve.      Electronically signed by Lynwood MARLA Began, MD on 12/08/23 at 11:40 AM EDT

## 2023-12-13 ENCOUNTER — Ambulatory Visit
Admit: 2023-12-13 | Discharge: 2023-12-13 | Payer: PRIVATE HEALTH INSURANCE | Attending: Family Medicine | Primary: Family Medicine

## 2023-12-13 MED ORDER — CIPROFLOXACIN-DEXAMETHASONE 0.3-0.1 % OT SUSP
0.3-0.1 | Freq: Two times a day (BID) | OTIC | 0 refills | Status: AC
Start: 2023-12-13 — End: 2023-12-20

## 2023-12-13 NOTE — Progress Notes (Signed)
 "Harry Mitchell (DOB:  1970/03/21) is a 53 y.o. male, here for evaluation of the following chief complaint(s):  Other (Thinks he has a ear infection in right ear having some pain, and ear feels full/Has been using lidocaine)        ASSESSMENT/PLAN:  1. Right ear pain  Overview:  Started about a week ago. Possible drainage coming from ear. No other URI sx's. Never had before.   Mild ear fullness. He has been using topical lidocaine drops for ears.   -exam is c/w otitis externa  Orders:  -     ciprofloxacin -dexAMETHasone  (CIPRODEX ) 0.3-0.1 % otic suspension; Place 4 drops into the right ear 2 times daily for 7 days, Disp-7.5 mL, R-0Normal  2. Acute otitis externa of right ear, unspecified type  Overview:  -Recommend ciprodex  ear drops. Reviewed to avoid any swimming activities.   -pt will call if sx's do not resolve  Orders:  -     ciprofloxacin -dexAMETHasone  (CIPRODEX ) 0.3-0.1 % otic suspension; Place 4 drops into the right ear 2 times daily for 7 days, Disp-7.5 mL, R-0Normal      No results found for any visits on 12/13/23.    No follow-ups on file.    Vitals:    12/13/23 0917   BP: 122/70   BP Site: Right Upper Arm   Patient Position: Sitting   BP Cuff Size: Medium Adult   Pulse: 70   SpO2: 99%   Weight: 98 kg (216 lb)   Height: 1.727 m (5' 8)       Physical Exam:  Physical Exam  Constitutional:       Appearance: Normal appearance.   HENT:      Head: Normocephalic and atraumatic.      Left Ear: Tympanic membrane and ear canal normal.      Ears:      Comments: Right ear canal is swollen and full of purulent material. I am able to visualize TM and this appears normal.   Eyes:      Extraocular Movements: Extraocular movements intact.      Pupils: Pupils are equal, round, and reactive to light.   Cardiovascular:      Rate and Rhythm: Normal rate.   Pulmonary:      Effort: Pulmonary effort is normal.   Musculoskeletal:         General: No swelling.   Skin:     General: Skin is warm and dry.   Neurological:       Mental Status: He is alert.   Psychiatric:         Mood and Affect: Mood normal.         Current Outpatient Medications   Medication Sig Dispense Refill    ciprofloxacin -dexAMETHasone  (CIPRODEX ) 0.3-0.1 % otic suspension Place 4 drops into the right ear 2 times daily for 7 days 7.5 mL 0    insulin  lispro (HUMALOG ,ADMELOG ) 100 UNIT/ML SOLN injection vial Max TDD 90 units for continuous SQ infusion with insulin  pump 90 mL 1    ferrous sulfate  (IRON 325) 325 (65 Fe) MG tablet Take 1 tablet by mouth daily (with breakfast) 90 tablet 1    Continuous Glucose Sensor (FREESTYLE LIBRE 3 PLUS SENSOR) MISC Change sensor every 15 days 6 each 1    insulin  lispro, 1 Unit Dial , (HUMALOG  KWIKPEN) 100 UNIT/ML SOPN Take 6-8 units with breakfast and dinner. MAX TDD: 16 units 5 Adjustable Dose Pre-filled Pen Syringe 3    sildenafil  (REVATIO ) 20 MG tablet Take  1 tablet by mouth daily as needed (ED; take 30 min prior to sexual activity) 10 tablet 3    losartan  (COZAAR ) 100 MG tablet Take 1 tablet by mouth daily 90 tablet 1    hydroCHLOROthiazide  (HYDRODIURIL ) 25 MG tablet Take 1 tablet by mouth daily 90 tablet 1    gabapentin  (NEURONTIN ) 300 MG capsule Take 1 capsule by mouth 2 times daily for 180 days. 60 capsule 5    FLUoxetine  (PROZAC ) 40 MG capsule Take 1 capsule by mouth daily 90 capsule 1    atorvastatin  (LIPITOR) 40 MG tablet Take 1 tablet by mouth daily 90 tablet 1    Insulin  Pen Needle 32G X 6 MM MISC Use one needle per injection -has 4 injections / day 400 each 3    Insulin  Pen Needle (PEN NEEDLES) 32G X 6 MM MISC 1 each by Does not apply route 2 times daily 200 each 3    insulin  glargine (LANTUS  SOLOSTAR) 100 UNIT/ML injection pen Inject 45-50 Units into the skin daily 5 mL 1    ibuprofen  (ADVIL ;MOTRIN ) 800 MG tablet Take 1 tablet by mouth every 8 hours as needed for Pain 90 tablet 2    albuterol  sulfate HFA (VENTOLIN  HFA) 108 (90 Base) MCG/ACT inhaler Inhale 2 puffs into the lungs 4 times daily as needed for Wheezing 18 g 0      No current facility-administered medications for this visit.        PHQ-9 Total Score: 0 (12/13/2023  9:21 AM)  Thoughts that you would be better off dead, or of hurting yourself in some way: 0 (12/13/2023  9:21 AM)       Past Medical History:   Diagnosis Date    Hyperlipidemia     Hypertension     Type 2 diabetes mellitus without complication         Past Surgical History:   Procedure Laterality Date    ANTERIOR CRUCIATE LIGAMENT REPAIR Right     FINGER AMPUTATION Left     Left Long finger    PAIN MANAGEMENT PROCEDURE N/A 06/09/2023    WC LESI L4-5 performed by [name] Ronalee ORN, MD at [facility] PAIN MANAGEMENT     PAIN MANAGEMENT PROCEDURE N/A 06/23/2023    WC/LESI #2; L4-5 performed by [name] Ronalee ORN, MD at [facility] PAIN MANAGEMENT        Family History   Problem Relation Age of Onset    Diabetes Brother     Colon Cancer Maternal Grandmother     Diabetes Paternal Grandfather     Diabetes Paternal Uncle        History of Present Illness: See A/P for details.    Allergies:  Allergies   Allergen Reactions    Sulfa Antibiotics      REACTION: pt. can't remember reaction     Health Maintenance   Topic Date Due    Hepatitis B vaccine (1 of 3 - 19+ 3-dose series) Never done    Pneumococcal 50+ years Vaccine (2 of 2 - PCV) 08/24/2011    Colorectal Cancer Screen  Never done    Shingles vaccine (2 of 2) 02/05/2021    Flu vaccine (1) 10/07/2023    COVID-19 Vaccine (2 - 2025-26 season) 11/07/2023    A1C test (Diabetic or Prediabetic)  12/08/2023    Diabetic Alb to Cr ratio (uACR) test  08/10/2024    Lipids  08/10/2024    GFR test (Diabetes, CKD 3-4, OR last GFR 15-59)  08/10/2024  Diabetic foot exam  09/06/2024    Depression Monitoring  09/06/2024    Diabetic retinal exam  02/17/2025    DTaP/Tdap/Td vaccine (2 - Td or Tdap) 05/31/2027    Hepatitis C screen  Completed    HIV screen  Completed    Hepatitis A vaccine  Aged Out    Hib vaccine  Aged Out    Polio vaccine  Aged Out    Meningococcal (ACWY) vaccine  Aged Out     Meningococcal B vaccine  Aged Out    Depression Screen  Discontinued    Pneumococcal 0-49 years Vaccine  Discontinued    Diabetes screen  Discontinued       Please note that Dragon speech recognition was likely used in creating this note.  A reasonable proofreading attempt has been made, however misrecognition errors may occur.      Electronically signed by:  --Rudolph KATHEE Carl, MD   "

## 2023-12-23 ENCOUNTER — Ambulatory Visit: Admit: 2023-12-23 | Discharge: 2023-12-23 | Payer: PRIVATE HEALTH INSURANCE | Primary: Family Medicine

## 2023-12-23 ENCOUNTER — Encounter

## 2023-12-23 DIAGNOSIS — Z23 Encounter for immunization: Principal | ICD-10-CM

## 2023-12-23 MED ORDER — SILDENAFIL CITRATE 20 MG PO TABS
20 | ORAL_TABLET | Freq: Every day | ORAL | 5 refills | 13.50000 days | Status: DC | PRN
Start: 2023-12-23 — End: 2024-02-23

## 2023-12-23 MED ORDER — INSULIN LISPRO (1 UNIT DIAL) 100 UNIT/ML SC SOPN
100 | SUBCUTANEOUS | 3 refills | 30.00000 days | Status: AC
Start: 2023-12-23 — End: ?

## 2023-12-23 NOTE — Telephone Encounter (Signed)
"  Requested Prescriptions     Pending Prescriptions Disp Refills    sildenafil  (REVATIO ) 20 MG tablet 10 tablet 3     Sig: Take 1 tablet by mouth daily as needed (ED; take 30 min prior to sexual activity)    Medication loaded please es last visit 10/07 upcoming visit 12/18  "

## 2023-12-23 NOTE — Telephone Encounter (Signed)
 PA submitted.

## 2023-12-27 ENCOUNTER — Encounter: Payer: PRIVATE HEALTH INSURANCE | Attending: Family Medicine | Primary: Family Medicine

## 2023-12-27 ENCOUNTER — Encounter

## 2023-12-27 NOTE — Telephone Encounter (Signed)
"  [facility] is calling to check the status of refill request for [employer]    Please assist  "

## 2023-12-28 ENCOUNTER — Ambulatory Visit
Admit: 2023-12-28 | Discharge: 2023-12-28 | Payer: PRIVATE HEALTH INSURANCE | Attending: Family Medicine | Primary: Family Medicine

## 2023-12-28 MED ORDER — FREESTYLE LIBRE 3 PLUS SENSOR MISC
1 refills | Status: AC
Start: 2023-12-28 — End: ?

## 2023-12-28 MED ORDER — AMOXICILLIN-POT CLAVULANATE 875-125 MG PO TABS
875-125 | ORAL_TABLET | Freq: Two times a day (BID) | ORAL | 0 refills | Status: DC
Start: 2023-12-28 — End: 2023-12-28

## 2023-12-28 MED ORDER — AMOXICILLIN-POT CLAVULANATE 875-125 MG PO TABS
875-125 | ORAL_TABLET | Freq: Two times a day (BID) | ORAL | 0 refills | Status: AC
Start: 2023-12-28 — End: 2024-01-07

## 2023-12-28 NOTE — Progress Notes (Signed)
 "Harry Mitchell (DOB:  May 13, 1970) is a 53 y.o. male, here for evaluation of the following chief complaint(s):  Sinus Problem (Noticed a few days ago, Harry Mitchell is a lil swollen, not getting as much nasal drainage/Has only taken ibuprofen  )        ASSESSMENT/PLAN:  1. Acute non-recurrent maxillary sinusitis  Overview:  Symptoms started 3 days ago. Pressure and pain on right maxillary sinus. It is very tender. It is also swollen and erythematous. Pt denies any recent URI sx's. No fevers, chills, body aches. On exam pt is very TTP over his right maxillary sinus close to his nose. There is swelling and erythema. He has poor dentition. He is mostly edentulous on top except for 2 molars on right side. These are broken off and have significant gum recession.   -concern for odontogenic sinusitis. Will treat with augmentin  BID x 10 days. Advised to take with food and to complete the entire course.  -Patient does not have a dentist. I emphasized importance of seeing a dentist ASAP. Provided name of a local dentist-recommend call [facility] to see if they take his insurance.   -continue taking NSAIDs for pain control  Orders:  -     amoxicillin -clavulanate (AUGMENTIN ) 875-125 MG per tablet; Take 1 tablet by mouth 2 times daily for 10 days, Disp-20 tablet, R-0Normal      No results found for any visits on 12/28/23.    No follow-ups on file.    Vitals:    12/28/23 1038   BP: 108/71   BP Site: Left Upper Arm   Patient Position: Sitting   BP Cuff Size: Medium Adult   Pulse: 71   Temp: 97.8 F (36.6 C)   TempSrc: Temporal   SpO2: 98%   Weight: 98.6 kg (217 lb 6.4 oz)   Height: 1.727 m (5' 8)       Physical Exam:  Physical Exam  Constitutional:       Appearance: Normal appearance.   HENT:      Head: Normocephalic and atraumatic.      Mouth/Throat:      Comments: On exam pt is very TTP over his right maxillary sinus close to his nose. There is swelling and erythema. He has poor dentition. He is mostly edentulous on top  except for 2 molars on right side. These are broken off and have significant gum recession.   Eyes:      Extraocular Movements: Extraocular movements intact.      Pupils: Pupils are equal, round, and reactive to light.   Cardiovascular:      Rate and Rhythm: Normal rate.   Pulmonary:      Effort: Pulmonary effort is normal.   Musculoskeletal:         General: No swelling.   Skin:     General: Skin is warm and dry.   Neurological:      Mental Status: He is alert.   Psychiatric:         Mood and Affect: Mood normal.           Current Outpatient Medications   Medication Sig Dispense Refill    amoxicillin -clavulanate (AUGMENTIN ) 875-125 MG per tablet Take 1 tablet by mouth 2 times daily for 10 days 20 tablet 0    insulin  lispro, 1 Unit Dial , (HUMALOG  KWIKPEN) 100 UNIT/ML SOPN Take 6-8 units with breakfast and dinner. MAX TDD: 16 units 5 Adjustable Dose Pre-filled Pen Syringe 3    sildenafil  (REVATIO ) 20 MG  tablet Take 1 tablet by mouth daily as needed (ED; take 30 min prior to sexual activity) 10 tablet 5    insulin  lispro (HUMALOG ,ADMELOG ) 100 UNIT/ML SOLN injection vial Max TDD 90 units for continuous SQ infusion with insulin  pump 90 mL 1    ferrous sulfate  (IRON 325) 325 (65 Fe) MG tablet Take 1 tablet by mouth daily (with breakfast) 90 tablet 1    Continuous Glucose Sensor (FREESTYLE LIBRE 3 PLUS SENSOR) MISC Change sensor every 15 days 6 each 1    losartan  (COZAAR ) 100 MG tablet Take 1 tablet by mouth daily 90 tablet 1    hydroCHLOROthiazide  (HYDRODIURIL ) 25 MG tablet Take 1 tablet by mouth daily 90 tablet 1    gabapentin  (NEURONTIN ) 300 MG capsule Take 1 capsule by mouth 2 times daily for 180 days. 60 capsule 5    FLUoxetine  (PROZAC ) 40 MG capsule Take 1 capsule by mouth daily 90 capsule 1    atorvastatin  (LIPITOR) 40 MG tablet Take 1 tablet by mouth daily 90 tablet 1    Insulin  Pen Needle 32G X 6 MM MISC Use one needle per injection -has 4 injections / day 400 each 3    Insulin  Pen Needle (PEN NEEDLES) 32G X 6 MM  MISC 1 each by Does not apply route 2 times daily 200 each 3    insulin  glargine (LANTUS  SOLOSTAR) 100 UNIT/ML injection pen Inject 45-50 Units into the skin daily 5 mL 1    ibuprofen  (ADVIL ;MOTRIN ) 800 MG tablet Take 1 tablet by mouth every 8 hours as needed for Pain 90 tablet 2    albuterol  sulfate HFA (VENTOLIN  HFA) 108 (90 Base) MCG/ACT inhaler Inhale 2 puffs into the lungs 4 times daily as needed for Wheezing 18 g 0     No current facility-administered medications for this visit.        PHQ-9 Total Score: 0 (12/28/2023 10:40 AM)  Thoughts that you would be better off dead, or of hurting yourself in some way: 0 (12/28/2023 10:40 AM)       Past Medical History:   Diagnosis Date    Hyperlipidemia     Hypertension     Type 2 diabetes mellitus without complication         Past Surgical History:   Procedure Laterality Date    ANTERIOR CRUCIATE LIGAMENT REPAIR Right     FINGER AMPUTATION Left     Left Long finger    PAIN MANAGEMENT PROCEDURE N/A 06/09/2023    WC LESI L4-5 performed by [name] Ronalee ORN, MD at [facility] PAIN MANAGEMENT     PAIN MANAGEMENT PROCEDURE N/A 06/23/2023    WC/LESI #2; L4-5 performed by [name] Ronalee ORN, MD at [facility] PAIN MANAGEMENT        Family History   Problem Relation Age of Onset    Diabetes Brother     Colon Cancer Maternal Grandmother     Diabetes Paternal Grandfather     Diabetes Paternal Uncle        History of Present Illness: See A/P for details.    Allergies:  Allergies   Allergen Reactions    Sulfa Antibiotics      REACTION: pt. can't remember reaction     Health Maintenance   Topic Date Due    Hepatitis B vaccine (1 of 3 - 19+ 3-dose series) Never done    Pneumococcal 50+ years Vaccine (2 of 2 - PCV) 08/24/2011    Colorectal Cancer Screen  Never done  Shingles vaccine (2 of 2) 02/05/2021    A1C test (Diabetic or Prediabetic)  12/08/2023    Diabetic Alb to Cr ratio (uACR) test  08/10/2024    Lipids  08/10/2024    GFR test (Diabetes, CKD 3-4, OR last GFR 15-59)  08/10/2024    Diabetic  foot exam  09/06/2024    Depression Monitoring  12/12/2024    Diabetic retinal exam  02/17/2025    DTaP/Tdap/Td vaccine (2 - Td or Tdap) 05/31/2027    Flu vaccine  Completed    COVID-19 Vaccine  Completed    Hepatitis C screen  Completed    HIV screen  Completed    Hepatitis A vaccine  Aged Out    Hib vaccine  Aged Out    Polio vaccine  Aged Out    Meningococcal (ACWY) vaccine  Aged Out    Meningococcal B vaccine  Aged Out    Depression Screen  Discontinued    Pneumococcal 0-49 years Vaccine  Discontinued    Diabetes screen  Discontinued       Please note that Dragon speech recognition was likely used in creating this note.  A reasonable proofreading attempt has been made, however misrecognition errors may occur.      Electronically signed by:  --Rudolph KATHEE Carl, MD   "

## 2024-01-11 ENCOUNTER — Inpatient Hospital Stay: Payer: Worker's Compensation | Attending: Anesthesiology

## 2024-01-11 ENCOUNTER — Telehealth
Admit: 2024-01-11 | Discharge: 2024-01-11 | Payer: PRIVATE HEALTH INSURANCE | Attending: Physician Assistant | Primary: Family Medicine

## 2024-01-11 ENCOUNTER — Encounter: Payer: Worker's Compensation | Attending: Physician Assistant | Primary: Family Medicine

## 2024-01-11 MED ORDER — DIAZEPAM 5 MG PO TABS
5 | Freq: Once | ORAL | Status: DC | PRN
Start: 2024-01-11 — End: 2024-01-11

## 2024-01-11 MED ORDER — METHYLPREDNISOLONE ACETATE 80 MG/ML IJ SUSP
80 | INTRAMUSCULAR | Status: DC | PRN
Start: 2024-01-11 — End: 2024-01-11
  Administered 2024-01-11: 21:00:00 80 via EPIDURAL

## 2024-01-11 MED ORDER — MIDAZOLAM HCL 2 MG/2ML IJ SOLN
2 | INTRAMUSCULAR | Status: DC | PRN
Start: 2024-01-11 — End: 2024-01-11
  Administered 2024-01-11: 21:00:00 2 via INTRAVENOUS

## 2024-01-11 MED ORDER — BUPIVACAINE HCL (PF) 0.25 % IJ SOLN
0.25 | INTRAMUSCULAR | Status: AC
Start: 2024-01-11 — End: 2024-01-11

## 2024-01-11 MED ORDER — BUPIVACAINE HCL (PF) 0.25 % IJ SOLN
0.25 | INTRAMUSCULAR | Status: DC | PRN
Start: 2024-01-11 — End: 2024-01-11
  Administered 2024-01-11: 21:00:00 10 via INTRADERMAL

## 2024-01-11 MED ORDER — IOPAMIDOL 61 % IJ SOLN
61 | INTRAMUSCULAR | Status: DC | PRN
Start: 2024-01-11 — End: 2024-01-11
  Administered 2024-01-11: 21:00:00 3 via INTRATHECAL

## 2024-01-11 MED ORDER — MIDAZOLAM HCL 2 MG/2ML IJ SOLN
2 | INTRAMUSCULAR | Status: AC
Start: 2024-01-11 — End: 2024-01-11

## 2024-01-11 MED ORDER — METHYLPREDNISOLONE ACETATE 80 MG/ML IJ SUSP
80 | INTRAMUSCULAR | Status: AC
Start: 2024-01-11 — End: 2024-01-11

## 2024-01-11 MED FILL — METHYLPREDNISOLONE ACETATE 80 MG/ML IJ SUSP: 80 mg/mL | INTRAMUSCULAR | Qty: 1

## 2024-01-11 MED FILL — MIDAZOLAM HCL 2 MG/2ML IJ SOLN: 2 mg/mL | INTRAMUSCULAR | Qty: 2

## 2024-01-11 MED FILL — BUPIVACAINE HCL (PF) 0.25 % IJ SOLN: 0.25 % | INTRAMUSCULAR | Qty: 10

## 2024-01-11 NOTE — Procedures (Signed)
"  Date: 01/11/24     Diagnosis: Lumbar radiculopathy    Referral: Dr.  Soyla    Procedure: Lumbar epidural steroid injection    Level: L4-L5    Pain level pre procedure: 5 out of 10    Pain level post procedure: 5 out of 10    Moderate sedation:   .  Moderate intravenous conscious sedation was supervised by Dr. [name]  .  The patient was independently monitored by a registered nurse assigned to the procedure room  .  Monitoring included automated blood pressure EKG, capnography and continuous pulse telemetry  .  The detailed conscious record is permanently stored in the hospital information system  .  The following is on the conscious sedation record;      Start time: 1602      End time: 1613      Duration: 11 minutes      Meds given: Versed  2 mg      Procedure Note: The patient comes to the [facility] for a lumbar epidural steroid injection.  The patients major complaint is that of low back pain.  After a  discussion with the patient explaining the procedure, mechanism of action, hoped for benefit and risk involved including the risk of even permanent nerve damage or paralysis, informed consent was obtained.  The patient was taken to the block suite and placed in a prone position.  We then sterilely prepped and draped the area above the injection site.  Using 2 ccs of 1 % Xylocaine a skin wheal and the subcutaneous tissues were infiltrated for a good local anesthetic effect.  Then using an 18 gauge 3 1/2 inch Tuohy epidural needle I introduced it at the  level guided by biplanar fluoroscopy and loss of resistance technique.  Contrast was injected in the epidural space without vascular spread or intrathecal spread.  No blood, paresthesias or spinal fluid having been encountered 80 mg of Depo-Medrol  mixed in 2ml of .25% Marcaine  and 3 mls of normal saline was injected into the epidural space.  The patient is resting in the post block suite and understands to follow up as directed.     Ronalee LELON [name], MD      "

## 2024-01-11 NOTE — Discharge Instructions (Signed)
"  Pain Management Post Procedure Instructions Pain Management    The following post procedural instructions are for therapeutic injections such as: epidural steroid injections, facet injections, radiofrequency procedures and other pain management injections.  You may experience the following symptoms following your specific procedure:  Increased pain for a few days, which is probably due to the added pressure and irritation from the medication used in an already inflamed or irritated area.  Occasional bruising or muscle spasm related to the procedure.  If steroid medication was used during the injection, it may take one to seven days to begin working to relieve inflammation and swelling in your back or neck. The Lidocaine and/or Marcaine                  used as a numbing agent will usually wear off within 30 minutes to 6 hours.    Diet & Activity:  - Return to your regular diet.  - DO NOT drive a car or operate machinery for the rest of the day. (Unless you were told you did not receive sedation)  - You may apply a covered ice pack to the injection site, if needed-limit to 10 minutes on, followed by 10 minutes off. No heat pad/heat compress/hot pack to site for 3 days.  - Keep activity to minimum for the rest of the day.      Medicines:  - Continue home medications.  - If any of your regular medicines were stopped for this procedure, start taking them again tomorrow. (Blood thinners, motrin, aleve, aspirin )      Injection Site Care:  - Remove Band-Aid/Dressing in 24 hours and resume your normal hygiene routine tomorrow.  - Call your doctor if you experience excessive bleeding or drainage from injection site or if you have any signs/symptoms of infection such as pain and redness, swelling or odor at the site  Contact your physician immediately:  -Fever greater than 101 degrees F  -Increasing weakness or numbness in extremity ir change in bowel and/or bladder function.  -Severe persistent headache  -Contact your  physician if any problems occur or if questions arise after your departure from our facility      Call the Doctor Only if needed at [phone] IN THE CASE OF AN EMERGENCY- DIAL 911  "

## 2024-01-11 NOTE — H&P (Signed)
"  Date: 01/11/24     DX: Lumbar radiculopathy    PROCEDURE: L4-L5 ESI      Patient is here for a therapeutic pain procedure with fluoroscopy.    Vitals: See nursing notes    Mental status: Alert and oriented x3    Cardiovascular: Regular rate and rhythm    Lungs: Clear to auscultation bilaterally    Neuro: Nonfocal    Assessment:  Patient here for therapeutic pain procedure.     Plan:  Will plan to proceed with scheduled pain procedure        Ronalee Counts MD    "

## 2024-01-11 NOTE — Progress Notes (Signed)
 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 He recently started using the Beta Bionics iLet insulin  pump, which was downloaded and reviewed today. He is also using the Freestyle Libre 3+ CGM.    He reports satisfactory progress with the use of the Beta Bionics iLet insulin  pump and Freestyle Libre 3+ CGM. However, there was a brief interruption in sensor usage due to a 2-week period without a sensor. He communicated with the office regarding the need for a new prescription. He anticipates fewer sensor losses as the weather cools. He has been purchasing sensors out-of-pocket, which has impacted his insurance coverage. Perspiration has caused some sensors to detach, and his sister provided him with 3 to 4 sample sensors, which have been beneficial.    He expresses satisfaction with the pump and is gaining more understanding of its operation. When consuming a low-carbohydrate meal such as chicken Caesar salad, he does not need to announce it. He recalls an instance where he forgot to announce a meal, resulting in a delay of approximately 2 hours before he noticed a significant increase in his blood sugar levels.   Pump settings:     standard pattern- 24 hour total 62.9 units, 25.2 units basal     Basal rate: Unable to see on beta bionics pump  Carb Ratio 1: Unable to see  Insulin  Sensitivity 1: Unable to see  Target Glucose: 100  The patient is bolusing for usual often, attempting to give more insulin  when blood sugars are running high.  His typical breakfast is 8.8 units, usual lunch is 9.8 units and usual dinner is 10.9 units.  He is bolusing for more than usual meals at breakfast time 58%, more than usual 40% at lunch and more than usual 47% at dinner.  This is ultimately leading to multiple episodes of hypoglycemia after bolusing for  larger than normal meals.  He is often pausing his insulin  as well during episodes of hypoglycemia.  This is leading to confusion of the system.  I have downloaded and reviewed patient's CGM. Continuous glucose monitoring is from date October 22 through January 10, 2024  Looking at the trend starting at midnight blood sugar above 200, some hypoglycemia around 4 AM  Postprandial level are elevated particularly after lunch and dinner  Hypoglycemic episodes frequently after bolusing for larger than usual meals  Average glucose is 165 with 57.8% time in range and 7.3% hypoglycemia.  GMI 7.3%    He is currently on atorvastatin  40 mg daily for cholesterol management and losartan  100 mg for blood pressure control and kidney protection.      Prior to Visit Medications   Medication Sig Taking? Authorizing Provider   Continuous Glucose Sensor (FREESTYLE LIBRE 3 PLUS SENSOR) MISC Change sensor every 15 days Yes [place], [place], [place]   insulin  lispro, 1 Unit Dial , (HUMALOG  KWIKPEN) 100 UNIT/ML SOPN Take 6-8 units with breakfast and dinner. MAX TDD: 16 units Yes Michael Longs, PA   sildenafil  (REVATIO ) 20 MG tablet Take 1 tablet by mouth daily as needed (ED; take 30 min prior to sexual activity) Yes [name], MD   insulin  lispro (HUMALOG ,ADMELOG ) 100 UNIT/ML SOLN injection vial Max TDD 90 units for continuous SQ infusion with insulin  pump Yes Promise Bushong, PA   ferrous sulfate  (IRON 325) 325 (65 Fe) MG tablet Take  1 tablet by mouth daily (with breakfast) Yes [name], MD   losartan  (COZAAR ) 100 MG tablet Take 1 tablet by mouth daily Yes [name], MD   hydroCHLOROthiazide  (HYDRODIURIL ) 25 MG tablet Take 1 tablet by mouth daily Yes [name], MD   gabapentin  (NEURONTIN ) 300 MG capsule Take 1 capsule by mouth 2 times daily for 180 days. Yes [name], MD   FLUoxetine  (PROZAC ) 40 MG capsule Take 1 capsule by mouth daily Yes [name], MD   atorvastatin  (LIPITOR) 40 MG tablet Take 1 tablet by mouth daily Yes  [name], MD   Insulin  Pen Needle 32G X 6 MM MISC Use one needle per injection -has 4 injections / day Yes [name], MD   insulin  glargine (LANTUS  SOLOSTAR) 100 UNIT/ML injection pen Inject 45-50 Units into the skin daily Yes Veron Senner, PA   ibuprofen  (ADVIL ;MOTRIN ) 800 MG tablet Take 1 tablet by mouth every 8 hours as needed for Pain Yes [name], MD   albuterol  sulfate HFA (VENTOLIN  HFA) 108 (90 Base) MCG/ACT inhaler Inhale 2 puffs into the lungs 4 times daily as needed for Wheezing Yes [name], MD   Insulin  Pen Needle (PEN NEEDLES) 32G X 6 MM MISC 1 each by Does not apply route 2 times daily  [employer], [place], [place]     Social History     Tobacco Use    Smoking status: Former     Current packs/day: 0.00     Average packs/day: 1 pack/day for 14.3 years (14.3 ttl pk-yrs)     Types: Cigarettes     Start date: 03/09/2007     Quit date: 07/06/2021     Years since quitting: 2.5    Smokeless tobacco: Never   Substance Use Topics    Alcohol use: Not Currently    Drug use: Not Currently     Types: Methamphetamines (Crystal Meth)     Family History   Problem Relation Age of Onset    Diabetes Brother     Colon Cancer Maternal Grandmother     Diabetes Paternal Grandfather     Diabetes Paternal Uncle      ROS: As per HPI, all others negative  There were no vitals taken for this visit.   PHYSICAL EXAMINATION:  Vital Signs: (As obtained by patient/caregiver or practitioner observation)      09/29/2023     2:11 PM   Patient-Reported Vitals   Patient-Reported Weight 190   Patient-Reported Height 5'8   Patient-Reported Systolic 124 mmHg   Patient-Reported Diastolic 78 mmHg       Blood pressure-  Heart rate-    Respiratory rate-    Temperature-  Pulse oximetry-     Constitutional: [x]  Appears well-developed and well-nourished []  No apparent distress      []  Abnormal-   Mental status  [x]  Alert and awake  [x]  Oriented to person/place/time [x] Able to follow commands      Eyes:  EOM    [x]   Normal  []  Abnormal-  Sclera   [x]   Normal  []  Abnormal -         Discharge [x]   None visible  []  Abnormal -    HENT:   [x]  Normocephalic, atraumatic.  []  Abnormal   [x]  Mouth/Throat: Mucous membranes are moist.     External Ears [x]  Normal  []  Abnormal-     Neck: [x]  No visualized mass     Pulmonary/Chest: [x]   Respiratory effort normal.  [x]  No visualized signs of difficulty breathing or respiratory distress        []  Abnormal-      Musculoskeletal:   []  Normal gait with no signs of ataxia         [x]  Normal range of motion of neck        []  Abnormal-       Neurological:        [x]  No Facial Asymmetry (Cranial nerve 7 motor function) (limited exam to video visit)          [x]  No gaze palsy        []  Abnormal-         Skin:        [x]  No significant exanthematous lesions or discoloration noted on facial skin         []  Abnormal-            Psychiatric:       [x]  Normal Affect []  No Hallucinations        []  Abnormal-     Other pertinent observable physical exam findings-       No results found for this or any previous visit (from the past 4 weeks).     Assessment & Plan  1. Latent autoimmune diabetes in adults (LADA):  - Blood glucose levels have shown significant improvement since the initiation of the Beta Bionics iLet insulin  pump, with an estimated A1c value of 7.3% based on the past two weeks' data. This is a marked improvement from previous readings exceeding 10% prior to pump usage. However, there is still noticeable variability in blood glucose levels, and hypoglycemic episodes continue to occur.  - He was advised to consider using Skin Grip patches, available on [employer], to secure the sensor during the summer months. Alternatively, he could use Skin Tac, which can be purchased at a pharmacy, to enhance skin adhesion for the sensor. The application of an aerosol antiperspirant was also suggested to help manage perspiration. He was encouraged to contact [employer] if he encounters issues with sensor adhesion, as they may provide replacements. He  was also informed that our office often has samples of the FreeStyle Libre 3+ CGM available.  - He was instructed to bolus for his usual meals and allow the pump to make necessary corrections. He was cautioned against pausing or stopping the insulin , as this could disrupt the algorithm. He was reminded to use the rule of 15s to correct low blood glucose levels and avoid overcorrection, which could lead to high blood glucose levels. He was advised to announce his meals as usual and trust the pump's algorithm to make appropriate corrections. He was also advised to avoid pausing the pump unless necessary for charging.       Attestation      1. LADA (latent autoimmune diabetes in adults), managed as type 1 (HCC)      [facility], was evaluated through a synchronous (real-time) audio-video encounter. The patient (or guardian if applicable) is aware that this is a billable service, which includes applicable co-pays. This Virtual Visit was conducted with patient's (and/or legal guardian's) consent. Patient identification was verified, and a caregiver was present when appropriate.   The patient was located at Home: [address]  [place] [address]  Provider was located at Home (Appt Dept State): [place]  Confirm you are appropriately licensed, registered, or certified to deliver care in the state where the patient is located  as indicated above. If you are not or unsure, please re-schedule the visit: Yes, I confirm.       Return in about 3 months (around 04/12/2024) for follow up, glucose review.    Total time spent on this encounter: 35 mins    The patient (or guardian, if applicable) and other individuals in attendance with the patient were advised that Artificial Intelligence will be utilized during this visit to record, process the conversation to generate a clinical note, and support improvement of the AI technology. The patient (or guardian, if applicable) and other individuals in attendance at the  appointment consented to the use of AI, including the recording.      --Rocky Opal, PA on 01/11/2024 at 2:33 PM  "

## 2024-02-13 ENCOUNTER — Encounter
Admit: 2024-02-13 | Discharge: 2024-02-13 | Payer: Worker's Compensation | Attending: Student in an Organized Health Care Education/Training Program | Primary: Family Medicine

## 2024-02-13 DIAGNOSIS — L03213 Periorbital cellulitis: Principal | ICD-10-CM

## 2024-02-13 MED ORDER — AMOXICILLIN-POT CLAVULANATE 875-125 MG PO TABS
875-125 | ORAL_TABLET | Freq: Two times a day (BID) | ORAL | 0 refills | Status: AC
Start: 2024-02-13 — End: 2024-02-18

## 2024-02-13 NOTE — Progress Notes (Signed)
 "[facility]  Video/Telephone Visit  DATE OF VISIT: 02/13/2024  PATIENT NAME: Harry Mitchell  DOB: 02-09-1971    Total Time: minutes: 11-20 minutes    This is a audio-video visit.    [name] was evaluated through a synchronous (real-time) audio/video encounter. Patient identification was verified at the start of the visit. He (or guardian if applicable) is aware that this is a billable service, which includes applicable co-pays. This visit was conducted with the patient's (and/or legal guardian's) verbal consent. He has not had a related appointment within my department in the past 7 days or scheduled within the next 24 hours.   The patient was located at Home: [address]  [place] [address].  The provider was located at [facility] (Appt Dept): [address]  [address]  [place],  [place] [address].    Subjective     Chief Complaint: rash on face    History of Present Illness:  Harry Mitchell is a 53 y.o. male evaluated via telephone on 02/13/2024 for No chief complaint on file.  .     Patient has noted rash on face for past few days. Previously was a pimple that popped and developed into current symptoms. This is a recurrence from months back when he was treated for sinusitis related to odontogenic infection. Currently plans to see dentist today but wanted to get treatment as he feels it is spreading to his eye. No vision or EOM issues. No fever, chills, nausea, dysphagia, vomiting. No issues with augmentin  in the past. Past sinusitis did resolve completely before this. No recent hospital stays or surgeries. No trauma.    Review of Systems   All other systems reviewed and are negative.      Patient's medications, allergies, past medical, surgical, social and family histories were reviewed and updated as appropriate.    Current Outpatient Medications   Medication Sig Dispense Refill    amoxicillin -clavulanate (AUGMENTIN ) 875-125 MG per tablet Take 1 tablet  by mouth 2 times daily for 5 days 10 tablet 0    Continuous Glucose Sensor (FREESTYLE LIBRE 3 PLUS SENSOR) MISC Change sensor every 15 days 6 each 1    insulin  lispro, 1 Unit Dial , (HUMALOG  KWIKPEN) 100 UNIT/ML SOPN Take 6-8 units with breakfast and dinner. MAX TDD: 16 units 5 Adjustable Dose Pre-filled Pen Syringe 3    sildenafil  (REVATIO ) 20 MG tablet Take 1 tablet by mouth daily as needed (ED; take 30 min prior to sexual activity) 10 tablet 5    insulin  lispro (HUMALOG ,ADMELOG ) 100 UNIT/ML SOLN injection vial Max TDD 90 units for continuous SQ infusion with insulin  pump 90 mL 1    ferrous sulfate  (IRON 325) 325 (65 Fe) MG tablet Take 1 tablet by mouth daily (with breakfast) 90 tablet 1    losartan  (COZAAR ) 100 MG tablet Take 1 tablet by mouth daily 90 tablet 1    hydroCHLOROthiazide  (HYDRODIURIL ) 25 MG tablet Take 1 tablet by mouth daily 90 tablet 1    gabapentin  (NEURONTIN ) 300 MG capsule Take 1 capsule by mouth 2 times daily for 180 days. 60 capsule 5    FLUoxetine  (PROZAC ) 40 MG capsule Take 1 capsule by mouth daily 90 capsule 1    atorvastatin  (LIPITOR) 40 MG tablet Take 1 tablet by mouth daily 90 tablet 1    Insulin  Pen Needle 32G X 6 MM MISC Use one needle per injection -has 4 injections / day 400 each 3  Insulin  Pen Needle (PEN NEEDLES) 32G X 6 MM MISC 1 each by Does not apply route 2 times daily 200 each 3    insulin  glargine (LANTUS  SOLOSTAR) 100 UNIT/ML injection pen Inject 45-50 Units into the skin daily 5 mL 1    ibuprofen  (ADVIL ;MOTRIN ) 800 MG tablet Take 1 tablet by mouth every 8 hours as needed for Pain 90 tablet 2    albuterol  sulfate HFA (VENTOLIN  HFA) 108 (90 Base) MCG/ACT inhaler Inhale 2 puffs into the lungs 4 times daily as needed for Wheezing 18 g 0     No current facility-administered medications for this visit.         Objective         02/13/2024     4:26 PM   Patient-Reported Vitals   Patient-Reported Weight 215   Patient-Reported Height 5 ft 9in   Patient-Reported Systolic 120 mmHg    Patient-Reported Diastolic 78 mmHg       There were no vitals filed for this visit.   SpO2 Readings from Last 1 Encounters:   01/11/24 99%      Wt Readings from Last 3 Encounters:   01/11/24 98 kg (216 lb)   12/28/23 98.6 kg (217 lb 6.4 oz)   12/13/23 98 kg (216 lb)     Physical Exam  HENT:      Head: Normocephalic and atraumatic.   Eyes:      Extraocular Movements: Extraocular movements intact.      Conjunctiva/sclera: Conjunctivae normal.   Pulmonary:      Effort: Pulmonary effort is normal.   Musculoskeletal:      Cervical back: Normal range of motion.   Skin:     Comments: R sided redness with swelling along nasolacrimal bridge without eyelid swelling or drainage.   Neurological:      General: No focal deficit present.      Mental Status: He is alert.      Cranial Nerves: No cranial nerve deficit.      Motor: No weakness.   Psychiatric:         Mood and Affect: Mood normal.         Behavior: Behavior normal.         Thought Content: Thought content normal.         Judgment: Judgment normal.         Assessment & Plan     Harry Mitchell is a 53 y.o.male who presents to clinic with:     Assessment & Plan  Preseptal cellulitis   Likely related to recent acne but could be affected by ondontogenic disease  Hx of DM on insulin   No signs of sepsis or red flag symptoms  Rx augmentin   Advised continued follow up with dentist  Advised close follow up with PCP    Orders:    amoxicillin -clavulanate (AUGMENTIN ) 875-125 MG per tablet; Take 1 tablet by mouth 2 times daily for 5 days           Follow up: Return if symptoms worsen or fail to improve.   [name], MD    "

## 2024-02-15 ENCOUNTER — Telehealth

## 2024-02-15 NOTE — Telephone Encounter (Signed)
"  No labs in 02-16-24 txs  "

## 2024-02-16 ENCOUNTER — Other Ambulatory Visit: Admit: 2024-02-16 | Discharge: 2024-02-16 | Payer: Worker's Compensation | Primary: Family Medicine

## 2024-02-16 ENCOUNTER — Encounter: Attending: Family | Primary: Family Medicine

## 2024-02-16 DIAGNOSIS — D509 Iron deficiency anemia, unspecified: Principal | ICD-10-CM

## 2024-02-16 LAB — CBC WITH AUTO DIFFERENTIAL
Basophils %: 0.7 % (ref 0.0–2.0)
Basophils Absolute: 0 x10e3/mcL (ref 0.0–0.2)
Eosinophils %: 2.7 % (ref 0.0–7.0)
Eosinophils Absolute: 0.2 x10e3/mcL (ref 0.0–0.5)
Hematocrit: 41.9 % (ref 38.0–52.0)
Hemoglobin: 14.1 g/dL (ref 13.0–17.3)
Immature Grans (Abs): 0.02 x10e3/mcL (ref 0.00–0.06)
Immature Granulocytes %: 0.3 % (ref 0.0–0.6)
Lymphocytes Absolute: 2.1 x10e3/mcL (ref 1.0–3.2)
Lymphocytes: 28.2 % (ref 15.0–45.0)
MCH: 27.6 pg (ref 27.0–34.5)
MCHC: 33.7 g/dL (ref 30.0–36.0)
MCV: 82 fL — ABNORMAL LOW (ref 84.0–100.0)
MPV: 10.5 fL (ref 7.0–12.2)
Monocytes %: 5.2 % (ref 4.0–12.0)
Monocytes Absolute: 0.4 x10e3/mcL (ref 0.3–1.0)
NRBC Absolute: 0 x10e3/mcL (ref 0.00–0.01)
NRBC Automated: 0 % (ref 0.0–0.2)
Neutrophils %: 62.9 % (ref 42.0–74.0)
Neutrophils Absolute: 4.7 x10e3/mcL (ref 1.6–7.3)
Platelets: 289 x10e3/mcL (ref 140–440)
RBC: 5.11 x10e6/mcL (ref 4.00–5.60)
RDW: 13.1 % (ref 10.0–17.0)
WBC: 7.5 x10e3/mcL (ref 3.8–10.6)

## 2024-02-16 LAB — HEMOGLOBIN A1C
Estimated Avg Glucose: 163
Estimated Avg Glucose: 183
Hemoglobin A1C: 7.3 % — ABNORMAL HIGH (ref 4.0–6.0)

## 2024-02-16 LAB — PSA SCREENING: PSA, Screening: 2.85 ng/mL (ref 0.000–4.000)

## 2024-02-16 NOTE — Telephone Encounter (Signed)
 Labs ordered

## 2024-02-16 NOTE — Progress Notes (Signed)
"  Venipuncture-[id number]:  Verbal consent obtained; patient tolerated procedure well; site cleansed with aseptic technique; pressure applied.  Location:left arm   Technique:21 g n        "

## 2024-02-17 LAB — IRON AND TIBC
Iron % Saturation: 36 % (ref 20–40)
Iron: 129 ug/dL (ref 59–158)
TIBC: 360 ug/dL (ref 250–450)
UIBC: 231 ug/dL (ref 112.0–347.0)

## 2024-02-17 LAB — COMPREHENSIVE METABOLIC PANEL
ALT: 33 U/L (ref 0–42)
AST: 35 U/L (ref 0–46)
Albumin/Globulin Ratio: 1.8 (ref 1.00–2.70)
Albumin: 4.3 g/dL (ref 3.5–5.2)
Alk Phosphatase: 85 U/L (ref 40–130)
Anion Gap: 10 mmol/L (ref 2–17)
BUN: 14 mg/dL (ref 6–20)
CO2: 24 mmol/L (ref 22–29)
Calcium: 9.3 mg/dL (ref 8.5–10.7)
Chloride: 101 mmol/L (ref 98–107)
Creatinine: 0.9 mg/dL (ref 0.7–1.3)
Est, Glom Filt Rate: 101 mL/min/1.73m?? (ref >=60)
Globulin: 2.4 g/dL (ref 1.9–4.4)
Glucose: 120 mg/dL — ABNORMAL HIGH (ref 70–99)
Osmolaliy Calculated: 272 mosm/kg (ref 270–287)
Potassium: 4.8 mmol/L (ref 3.5–5.3)
Sodium: 135 mmol/L (ref 135–145)
Total Bilirubin: 0.37 mg/dL (ref 0.00–1.20)
Total Protein: 6.7 g/dL (ref 5.7–8.3)

## 2024-02-17 LAB — LIPID PANEL
Chol/HDL Ratio: 3.2 (ref 0.0–4.4)
Cholesterol, Total: 142 mg/dL (ref 100–200)
HDL: 44 mg/dL (ref >=40)
LDL Cholesterol: 84 mg/dL (ref 0.0–100.0)
LDL/HDL Ratio: 1.9
Triglycerides: 70 mg/dL (ref 0–149)
VLDL: 14 mg/dL (ref 5.0–40.0)

## 2024-02-17 LAB — TSH REFLEX TO FT4: TSH: 1.86 u[IU]/mL (ref 0.358–3.740)

## 2024-02-17 LAB — FERRITIN: Ferritin: 57.3 ng/mL (ref 30.0–400.0)

## 2024-02-23 ENCOUNTER — Ambulatory Visit
Admit: 2024-02-23 | Discharge: 2024-02-23 | Payer: Worker's Compensation | Attending: Family Medicine | Primary: Family Medicine

## 2024-02-23 ENCOUNTER — Encounter

## 2024-02-23 VITALS — BP 138/78 | HR 44 | Wt 217.0 lb

## 2024-02-23 DIAGNOSIS — Z0000 Encounter for general adult medical examination without abnormal findings: Principal | ICD-10-CM

## 2024-02-23 MED ORDER — HYDROCHLOROTHIAZIDE 25 MG PO TABS
25 | ORAL_TABLET | Freq: Every day | ORAL | 3 refills | 90.00000 days | Status: AC
Start: 2024-02-23 — End: ?

## 2024-02-23 MED ORDER — ATORVASTATIN CALCIUM 40 MG PO TABS
40 | ORAL_TABLET | Freq: Every day | ORAL | 1 refills | 90.00000 days | Status: DC
Start: 2024-02-23 — End: 2024-03-15

## 2024-02-23 MED ORDER — SILDENAFIL CITRATE 20 MG PO TABS
20 | ORAL_TABLET | Freq: Every day | ORAL | 5 refills | 13.50000 days | Status: AC | PRN
Start: 2024-02-23 — End: ?

## 2024-02-23 MED ORDER — IBUPROFEN 800 MG PO TABS
800 | ORAL_TABLET | Freq: Three times a day (TID) | ORAL | 2 refills | 15.00000 days | Status: DC | PRN
Start: 2024-02-23 — End: 2024-03-21

## 2024-02-23 MED ORDER — LOSARTAN POTASSIUM 100 MG PO TABS
100 | ORAL_TABLET | Freq: Every day | ORAL | 3 refills | 90.00000 days | Status: AC
Start: 2024-02-23 — End: ?

## 2024-02-23 MED ORDER — AMOXICILLIN-POT CLAVULANATE 875-125 MG PO TABS
875-125 | ORAL_TABLET | Freq: Two times a day (BID) | ORAL | 0 refills | 7.00000 days | Status: AC
Start: 2024-02-23 — End: 2024-02-28

## 2024-02-23 MED ORDER — GABAPENTIN 300 MG PO CAPS
300 | ORAL_CAPSULE | Freq: Two times a day (BID) | ORAL | 5 refills | 30.00000 days | Status: AC
Start: 2024-02-23 — End: 2024-08-21

## 2024-02-23 NOTE — Progress Notes (Signed)
 "  Harry Mitchell (DOB:  August 26, 1970) is a 53 y.o. male,Established patient, here for evaluation of the following chief complaint(s):  Annual Exam (Colonoscopy ? Would like a referral)      ASSESSMENT/PLAN:      ICD-10-CM    1. Annual physical exam  Z00.00       2. Mixed hyperlipidemia  E78.2 atorvastatin  (LIPITOR) 40 MG tablet      3. Lumbar pain with radiation down left leg  M54.50 ibuprofen  (ADVIL ;MOTRIN ) 800 MG tablet    M79.605 gabapentin  (NEURONTIN ) 300 MG capsule      4. Essential hypertension, benign  I10 hydroCHLOROthiazide  (HYDRODIURIL ) 25 MG tablet     losartan  (COZAAR ) 100 MG tablet      5. LADA (latent autoimmune diabetes in adults), managed as type 1 (HCC)  E13.9       6. Erectile dysfunction, unspecified erectile dysfunction type  N52.9 sildenafil  (REVATIO ) 20 MG tablet      7. Acute non-recurrent maxillary sinusitis  J01.00 amoxicillin -clavulanate (AUGMENTIN ) 875-125 MG per tablet      8. Colon cancer screening  Z12.11 Harry Rush, DO - Gastroenterology      9. Family history of colon cancer  Z80.0 Harry Rush, DO - Gastroenterology      10. Immunization due  Z23 Pneumococcal, PCV20, PREVNAR 20, (age 6w+), IM, PF      11. Irregular heart beat  I49.9 EKG 12 Lead      12. Abnormal ECG  R94.31 RSF[facility] - Harry Savant, MD, Cardiology, [facility]     CTA CARDIAC W C [place] [facility] W CONTRAST     Coronary CTA peripheral IV instructions: See comments below     metoprolol  (LOPRESSOR ) injection 5 mg     metoprolol  tartrate (LOPRESSOR ) 50 MG tablet     nitroGLYCERIN  (NITROSTAT ) SL tablet 0.4 mg     nitroGLYCERIN  (NITROSTAT ) SL tablet 0.8 mg     sodium chloride  flush 0.9 % injection 5-40 mL     sodium chloride  flush 0.9 % injection 5-40 mL     0.9 % sodium chloride  infusion      13. Excessive postexertional fatigue  R53.83 RSFPP [place] Harry Savant, MD, Cardiology, [facility]     CTA CARDIAC W C [place] M[place] W CONTRAST     Coronary CTA peripheral IV instructions: See comments below     metoprolol  (LOPRESSOR )  injection 5 mg     metoprolol  tartrate (LOPRESSOR ) 50 MG tablet     nitroGLYCERIN  (NITROSTAT ) SL tablet 0.4 mg     nitroGLYCERIN  (NITROSTAT ) SL tablet 0.8 mg     sodium chloride  flush 0.9 % injection 5-40 mL     sodium chloride  flush 0.9 % injection 5-40 mL     0.9 % sodium chloride  infusion        Assessment & Plan  1. Diabetes Mellitus:  - Glucose levels have shown significant improvement, with a current A1c of 7.3, down from 10.4.  - He is using an insulin  pump and Freestyle Libre 3, which he reports are functioning well.  - He will continue with his current diabetes management plan.    2. Recurrent Dental Infection:  - He experienced a recurrent sinus / dental infection and was recently given a 5-day course of antibiotics.  - Another 5-day course of antibiotics will be prescribed to manage the infection. He is advised to schedule a dental appointment to address the underlying issue to prevent further recurrences.    3. Low Heart Rate/ Excessive  postexertional fatigue -   - His heart rate was noted to be 44 bpm, which is lower than expected. He reports feeling exhausted, which could be related to his low heart rate or possibly seasonal depression - no abnormalities on labs that would contribute.   - An EKG was performed - no rhythm abnormalities identified though appears to have new left bundle branch block.    - Msg was sent to cardiology regarding symptoms and abnormal ECG who agreed further work-up would be warranted. After visit ordered CTA coronaries to further evaluate and TE sent   - Referral cardiology was placed, scheduled in March.     4. Health Maintenance:  - PSA levels have increased from 2 to 2.8, which warrants monitoring- will need to check in 3-6 mo but will aim to recheck at time of Harry Mitchell labs as long as within that time frame of 3-6 mo. Cholesterol levels, metabolic panel, and thyroid function tests are all within normal limits.  - He is due for a colonoscopy, which will be scheduled.  - He  will receive a pneumonia vaccine today. His shingles vaccination status will be verified, and if necessary, an additional dose will be administered.    Follow-up: The patient will follow up in 6 months.    CHANGES:   - No med changes other than additional augmentin  rx   - ECG done with new LBBBB - will check CTA , referral placed to cards   - Will plan to monitor PSA In 3-6 mo given above 2 and up to 2.8 from 2.0 last check - will need to order / decide at next visit       No follow-ups on file.    Patient here for physical for which preventive measures were discussed,  heatlhy lifestyle reviewed, appropriate referrals placed and vaccines discussed/administered if indicated.  Additional problem based code was used as also had management of either chronic condition with refill of medication / monitoring indicated with imaging or a new problem /symptom that required additional work-up beyond typical physical with either additional labs, imaging or referrals.         SUBJECTIVE/OBJECTIVE:  HPI    Harry Mitchell is a 53 y.o. male  that presents for follow-up/physical.     Screenings:   Colon cancer screening-  DUE < has never done  Lung cancer screening - 14 pack year hx , quit 2023 , not indicated   PSA - 2.8 - slightly higher than expected    Vaccines:    Shingrix - only one on file       Prevnar/pneumovax -    Tetanus - UTD, 3/22    COVID - UTD    Flu -  UTD     Specialists:      Endo - Harry Mitchell      Spine- Harry Mitchell      Ophthalmology - Harry Mitchell and now Harry Mitchell     History of Present Illness  The patient is a 53 year old male here for follow-up and physical.    He reports feeling well overall. Diabetes management includes using an insulin  pump since July, which has been effective. He has discontinued metformin  and Jardiance . Approximately six months ago, he consulted Harry Mitchell. [name] for sinus-related issues, which have since resolved. About two weeks ago, he had a virtual visit with Harry Mitchell. [name] for a recurrent  infection, likely related to a dental issue. A 5-day course of antibiotics was prescribed, and he plans  to address the dental issue in the future.    Recently, he consulted ophthalmologist Harry Mitchell. Jeanenne, who recommended a specialist consultation for potential surgery related to his diabetes. An appointment with a retina specialist is scheduled for tomorrow.    He acknowledges the need to improve adherence to iron supplementation due to persistently low MCV levels. He has never undergone a colonoscopy. There is a family history of colon cancer on his maternal side.    He has been experiencing fatigue, often falling asleep by 6:00 PM and waking up around 5:30 AM. He does not report any palpitations or decreased energy levels. He attributes his fatigue to recent feelings of depression, despite adhering to his medication regimen. He has been working overtime, which he believes may be contributing to his exhaustion.    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      Interval Hx:     Saw Harry Mitchell two days ago, was told worsening signs on eye exam related to retina for which he was referred to [facility] (Harry Mitchell) and has appt tomorrow, may need surgery.     Low HR -  has had decreased energy, notes sleeping more at night; feels more exhausted at end of ay but overall physical activity has been doing okay. Maybe feeling a little more depressed but was taking prozac .      CHRONIC HX/PRIOR HX:       DM2-  Significantly improved since switched to insulin  pump approx 7/25. Managed by endo / Mitchell Harry Opal. Has diabetic nephropathy and diabetic retinopathy.     HTN- Currently taking HCTZ and Losartan  for BP control.  Denies any s/e of use.  BP appropriate.     HLD- Currently taking 40 mg  atorvastatin . No medication s/e.    Anxiety/depression- Currently taking 40 Prozac .  Patient reports he has been on same medication for a few years.  He has inadvertently stopped medication and had significant return in symptoms at that time. Currently well controlled.    Back pain - had injury at work causing lumbar back pain.  Has been seeing Harry Mitchell Soyla, had LESI x 2 in April and repeat LESI in November.     Patient with history of substance abuse.  Has been clean since moving to [facility] to get his life back on track.  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    Immunization History   Administered Date(s) Administered    COVID-19, COMIRNATY [employer]), (age 12y+), IM, 30mcg/0.3mL 12/23/2023    COVID-19, Inactive, MODERNA BLUE border, Primary or Immunocompromised, (age 12y+) 11/08/2019    Influenza Virus Vaccine 12/03/2019, 12/11/2020    Influenza, FLUARIX, FLULAVAL, FLUZONE (age 82 mo+) and AFLURIA, (age 825 y+), Quadv PF, 0.5mL 03/31/2022    Influenza, FLUCELVAX, (age 82 mo+) IM, Trivalent PF, 0.5mL 12/23/2023    PPD Test 07/12/2011    Pneumococcal, PCV20, PREVNAR 20, (age 82w+), IM, 0.67mL 02/23/2024    Pneumococcal, PPSV23, PNEUMOVAX 23, (age 2y+), SC/IM, 0.5mL 08/24/2010    TD 2LF, TDVAX, (age 7y+), IM, 0.5mL 05/04/2006    TDaP, ADACEL (age 12y-64y), MYRTICE (age 10y+), IM, 0.5mL 05/30/2017    Zoster Recombinant (Shingrix) 12/11/2020  Tobacco Use      Smoking status: Former        Packs/day: 0.00        Years: 1 pack/day for 14.3 years (14.3 ttl pk-yrs)        Types: Cigarettes        Start date: 03/09/2007        Quit date: 07/06/2021        Years since quitting: 2.6      Smokeless tobacco: Never            02/23/2024     9:43 AM   PHQ-9    Little interest or pleasure in doing things 1   Feeling down, depressed, or hopeless 1   Trouble falling or staying asleep, or sleeping too much 0   Feeling tired or having little energy 1   Poor appetite or overeating 0   Feeling bad about yourself  - or that you are a failure or have let yourself or your family down 0   Trouble concentrating on things, such as reading the newspaper or watching television 0   Moving or speaking so slowly that other people could have noticed. Or the opposite - being so fidgety or restless that you have been moving around a lot more than usual 0   Thoughts that you would be better off dead, or of hurting yourself in some way 0   PHQ-2 Score 2   PHQ-9 Total Score 3       Tobacco Use      Smoking status: Former        Packs/day: 0.00        Years: 1 pack/day for 14.3 years (14.3 ttl pk-yrs)        Types: Cigarettes        Start date: 03/09/2007        Quit date: 07/06/2021        Years since quitting: 2.6      Smokeless tobacco: Never          Immunization History   Administered Date(s) Administered    COVID-19, COMIRNATY [employer]on[employer]), (age 12y+), IM, 48mcg/0.3mL 12/23/2023    COVID-19, Inactive, MODERNA BLUE border, Primary or Immunocompromised, (age 12y+) 11/08/2019    Influenza Virus Vaccine 12/03/2019, 12/11/2020    Influenza, FLUARIX, FLULAVAL, FLUZONE (age 50 mo+) and AFLURIA, (age 24 y+), Quadv PF, 0.5mL 03/31/2022    Influenza, FLUCELVAX, (age 50 mo+) IM, Trivalent PF, 0.5mL 12/23/2023    PPD Test 07/12/2011    Pneumococcal, PCV20, PREVNAR 20, (age 50w+), IM, 0.40mL 02/23/2024    Pneumococcal, PPSV23, PNEUMOVAX 23, (age 2y+), SC/IM, 0.65mL 08/24/2010    TD 2LF, TDVAX, (age 7y+), IM, 0.5mL 05/04/2006    TDaP, ADACEL (age 33y-64y), MYRTICE (age 10y+), IM, 0.46mL 05/30/2017    Zoster Recombinant (Shingrix) 12/11/2020           Current Outpatient Medications   Medication Sig Dispense Refill    atorvastatin  (LIPITOR) 40 MG tablet Take 1 tablet by mouth daily 90 tablet 1    ibuprofen  (ADVIL ;MOTRIN ) 800 MG tablet Take 1 tablet by mouth every 8 hours as needed for Pain 90 tablet 2    gabapentin  (NEURONTIN ) 300 MG capsule Take 1 capsule by mouth 2 times daily for 180 days. 60 capsule 5    hydroCHLOROthiazide  (HYDRODIURIL ) 25 MG tablet Take 1  tablet by mouth daily 90 tablet 3    losartan  (COZAAR ) 100 MG tablet Take 1 tablet by mouth daily 90 tablet 3  sildenafil  (REVATIO ) 20 MG tablet Take 1 tablet by mouth daily as needed (ED; take 30 min prior to sexual activity) 10 tablet 5    amoxicillin -clavulanate (AUGMENTIN ) 875-125 MG per tablet Take 1 tablet by mouth 2 times daily for 5 days 10 tablet 0    metoprolol  tartrate (LOPRESSOR ) 50 MG tablet Two hours before your CTA test check your heart rate, take by mouth 1 tablet if your heart rate is 55-70, 2 tablets if your heart rate is 71-80, 3 tablets if heart rate greater than 80, and no tablets if heart rate is less than 55. 3 tablet 0    Continuous Glucose Sensor (FREESTYLE LIBRE 3 PLUS SENSOR) MISC Change sensor every 15 days 6 each 1    insulin  lispro, 1 Unit Dial , (HUMALOG  KWIKPEN) 100 UNIT/ML SOPN Take 6-8 units with breakfast and dinner. MAX TDD: 16 units 5 Adjustable Dose Pre-filled Pen Syringe 3    insulin  lispro (HUMALOG ,ADMELOG ) 100 UNIT/ML SOLN injection vial Max TDD 90 units for continuous SQ infusion with insulin  pump 90 mL 1    ferrous sulfate  (IRON 325) 325 (65 Fe) MG tablet Take 1 tablet by mouth daily (with breakfast) 90 tablet 1    FLUoxetine  (PROZAC ) 40 MG capsule Take 1 capsule by mouth daily 90 capsule 1    insulin  glargine (LANTUS  SOLOSTAR) 100 UNIT/ML injection pen Inject 45-50 Units into the skin daily 5 mL 1    albuterol  sulfate HFA (VENTOLIN  HFA) 108 (90 Base) MCG/ACT inhaler Inhale 2 puffs into the lungs 4 times daily as needed for Wheezing 18 g 0    Insulin  Pen Needle 32G X 6 MM MISC Use one needle per injection -has 4 injections / day (Patient not taking: Reported on 02/23/2024) 400 each 3    Insulin  Pen Needle (PEN NEEDLES) 32G X 6 MM MISC 1 each by Does not apply route 2 times daily (Patient not taking: Reported on 02/23/2024) 200 each 3     No current facility-administered medications for this visit.        Prior to Admission medications   Medication Sig Start Date End Date  Taking? Authorizing Provider   atorvastatin  (LIPITOR) 40 MG tablet Take 1 tablet by mouth daily 02/23/24 08/21/24 Yes [name], MD   ibuprofen  (ADVIL ;MOTRIN ) 800 MG tablet Take 1 tablet by mouth every 8 hours as needed for Pain 02/23/24  Yes [name], MD   gabapentin  (NEURONTIN ) 300 MG capsule Take 1 capsule by mouth 2 times daily for 180 days. 02/23/24 08/21/24 Yes [name], MD   hydroCHLOROthiazide  (HYDRODIURIL ) 25 MG tablet Take 1 tablet by mouth daily 02/23/24  Yes [name], MD   losartan  (COZAAR ) 100 MG tablet Take 1 tablet by mouth daily 02/23/24  Yes [name], MD   sildenafil  (REVATIO ) 20 MG tablet Take 1 tablet by mouth daily as needed (ED; take 30 min prior to sexual activity) 02/23/24  Yes [name], MD   amoxicillin -clavulanate (AUGMENTIN ) 875-125 MG per tablet Take 1 tablet by mouth 2 times daily for 5 days 02/23/24 02/28/24 Yes [name], MD   metoprolol  tartrate (LOPRESSOR ) 50 MG tablet Two hours before your CTA test check your heart rate, take by mouth 1 tablet if your heart rate is 55-70, 2 tablets if your heart rate is 71-80, 3 tablets if heart rate greater than 80, and no tablets if heart rate is less than 55. 02/23/24  Yes [name], MD   Continuous  Glucose Sensor (FREESTYLE LIBRE 3 PLUS SENSOR) MISC Change sensor every 15 days 12/28/23  Yes Ruse, [place], [place]   insulin  lispro, 1 Unit Dial , (HUMALOG  KWIKPEN) 100 UNIT/ML SOPN Take 6-8 units with breakfast and dinner. MAX TDD: 16 units 12/23/23  Yes Ruse, [place], [place]   insulin  lispro (HUMALOG ,ADMELOG ) 100 UNIT/ML SOLN injection vial Max TDD 90 units for continuous SQ infusion with insulin  pump 10/05/23  Yes Ruse, [place], [place]   ferrous sulfate  (IRON 325) 325 (65 Fe) MG tablet Take 1 tablet by mouth daily (with breakfast) 09/07/23  Yes [name], MD   FLUoxetine  (PROZAC ) 40 MG capsule Take 1 capsule by mouth daily 08/10/23  Yes [name], MD   insulin  glargine (LANTUS  SOLOSTAR) 100 UNIT/ML injection pen Inject  45-50 Units into the skin daily 08/10/23  Yes Ruse, [place], Mitchell   albuterol  sulfate HFA (VENTOLIN  HFA) 108 (90 Base) MCG/ACT inhaler Inhale 2 puffs into the lungs 4 times daily as needed for Wheezing 02/09/23  Yes [name], MD   Insulin  Pen Needle 32G X 6 MM MISC Use one needle per injection -has 4 injections / day  Patient not taking: Reported on 02/23/2024 08/10/23   [name], MD   Insulin  Pen Needle (PEN NEEDLES) 32G X 6 MM MISC 1 each by Does not apply route 2 times daily  Patient not taking: Reported on 02/23/2024 08/10/23   Michael Longs, Mitchell       Past Medical History:   Diagnosis Date    Hyperlipidemia     Hypertension     Type 2 diabetes mellitus without complication         Past Surgical History:   Procedure Laterality Date    ANTERIOR CRUCIATE LIGAMENT REPAIR Right     FINGER AMPUTATION Left     Left Long finger    PAIN MANAGEMENT PROCEDURE N/A 06/09/2023    WC LESI L4-5 performed by Mitchell Ronalee ORN, MD at [facility] PAIN MANAGEMENT     PAIN MANAGEMENT PROCEDURE N/A 06/23/2023    WC/LESI #2; L4-5 performed by Mitchell Ronalee ORN, MD at [facility] PAIN MANAGEMENT     PAIN MANAGEMENT PROCEDURE N/A 01/11/2024    WC/LESI #3; L4-5 performed by Mitchell Ronalee ORN, MD at [facility] PAIN MANAGEMENT        REVIEW OF SYSTEMS:   ROS as noted in HPI, otherwise negative       BP 138/78   Pulse (!) 44   Wt 98.4 kg (217 lb)   SpO2 94%   BMI 32.99 kg/m      PHYSICAL EXAM:  General appearance - alert, well appearing, and in no distress  Neck - normal ROM, no cervical LAD, no thyroid enlargement or nodularity  HEENT - normal Tms bilaterally   Mouth - mucous membranes moist, poor dentition  Chest - clear to auscultation, no wheezes, normal work of breathing  Heart - normal rate and regular rhythm, occasional skipped beats appreciated on exam , no murmurs noted  Abd - soft, NTND, +BS x 4 quadrants   Neurological - alert, normal speech, no focal findings  Extremities - peripheral pulses normal, no edema     No results found for this visit on  02/23/24.           Results for orders placed or performed in visit on 02/16/24 (from the past 2160 hours)   CBC with Auto Differential   Result Value Ref Range    WBC 7.5 3.8 - 10.6 x10e3/mcL  RBC 5.11 4.00 - 5.60 x10e6/mcL    Hemoglobin 14.1 13.0 - 17.3 g/dL    Hematocrit 58.0 61.9 - 52.0 %    MCV 82.0 (L) 84.0 - 100.0 fL    MCH 27.6 27.0 - 34.5 pg    MCHC 33.7 30.0 - 36.0 g/dL    RDW 86.8 89.9 - 82.9 %    Platelets 289 140 - 440 x10e3/mcL    MPV 10.5 7.0 - 12.2 fL    NRBC Automated 0.0 0.0 - 0.2 %    NRBC Absolute 0.00 0.00 - 0.01 x10e3/mcL    Neutrophils % 62.9 42.0 - 74.0 %    Lymphocytes 28.2 15.0 - 45.0 %    Monocytes % 5.2 4.0 - 12.0 %    Eosinophils % 2.7 0.0 - 7.0 %    Basophils % 0.7 0.0 - 2.0 %    Neutrophils Absolute 4.7 1.6 - 7.3 x10e3/mcL    Lymphocytes Absolute 2.1 1.0 - 3.2 x10e3/mcL    Monocytes Absolute 0.4 0.3 - 1.0 x10e3/mcL    Eosinophils Absolute 0.2 0.0 - 0.5 x10e3/mcL    Basophils Absolute 0.0 0.0 - 0.2 x10e3/mcL    Immature Granulocytes % 0.3 0.0 - 0.6 %    Immature Grans (Abs) 0.02 0.00 - 0.06 x10e3/mcL   Lipid Panel   Result Value Ref Range    Cholesterol, Total 142 100 - 200 mg/dL    HDL 44 >=59 mg/dL    Triglycerides 70 0 - 149 mg/dL    LDL Cholesterol 15.9 0.0 - 100.0 mg/dL    LDL/HDL Ratio 1.9     Chol/HDL Ratio 3.2 0.0 - 4.4    VLDL 14.0 5.0 - 40.0 mg/dL   PSA Screening   Result Value Ref Range    PSA, Screening 2.850 0.000 - 4.000 ng/mL   Comprehensive Metabolic Panel   Result Value Ref Range    Sodium 135 135 - 145 mmol/L    Potassium 4.8 3.5 - 5.3 mmol/L    Chloride 101 98 - 107 mmol/L    CO2 24 22 - 29 mmol/L    Glucose 120 (H) 70 - 99 mg/dL    BUN 14 6 - 20 mg/dL    Creatinine 0.9 0.7 - 1.3 mg/dL    Anion Gap 10 2 - 17 mmol/L    Osmolaliy Calculated 272 270 - 287 mOsm/kg    Calcium  9.3 8.5 - 10.7 mg/dL    Total Protein 6.7 5.7 - 8.3 g/dL    Albumin 4.3 3.5 - 5.2 g/dL    Globulin 2.4 1.9 - 4.4 g/dL    Albumin/Globulin Ratio 1.80 1.00 - 2.70    Total Bilirubin 0.37 0.00 - 1.20  mg/dL    Alk Phosphatase 85 40 - 130 unit/L    AST 35 0 - 46 unit/L    ALT 33 0 - 42 unit/L    Est, Glom Filt Rate 101 >=60 mL/min/1.29m   TSH reflex to FT4   Result Value Ref Range    TSH 1.860 0.358 - 3.740 mcIU/mL   Hemoglobin A1C   Result Value Ref Range    Hemoglobin A1C 7.3 (H) 4.0 - 6.0 %    Estimated Avg Glucose 163     Estimated Avg Glucose 183    Iron and TIBC   Result Value Ref Range    Iron 129 59 - 158 mcg/dL    UIBC 768.9 887.9 - 652.9 mcg/dL    TIBC 639 749 - 549 mcg/dL  Iron % Saturation 36 20 - 40 %   Ferritin   Result Value Ref Range    Ferritin 57.3 30.0 - 400.0 ng/mL       Hemoglobin A1C   Date Value Ref Range Status   02/16/2024 7.3 (H) 4.0 - 6.0 % Final     Comment:     HEMOGLOBIN A1C INTERPRETATION:    The following arbitrary ranges may be used for interpretation of the results.  However, factors such as duration of diabetes, adherence to therapy, and  patient age should also be considered in assessing degree of blood glucose  control.    Hemoglobin A1C                 Avg. Blood Sugar  --------------------------------------------------------------  6%                           135 mg/dL  7%                           170 mg/dL  8%                           205 mg/dL  9%                           240 mg/dL  89%                          275 mg/dL    ======================================================    A1C                      Glucose Control  ----------------------------------------------------------------  < 6.0 %                   Normal  6.0 - 6.9 %               Abnormal  7.0 - 7.9 %               Sub-Optimal Control  > 8.0 %                   Inadequate Control           Lab Results   Component Value Date    NA 135 02/16/2024    K 4.8 02/16/2024    CL 101 02/16/2024    CO2 24 02/16/2024    BUN 14 02/16/2024    CREATININE 0.9 02/16/2024    GLUCOSE 120 (H) 02/16/2024    CALCIUM  9.3 02/16/2024    BILITOT 0.37 02/16/2024    ALKPHOS 85 02/16/2024    AST 35 02/16/2024    ALT 33 02/16/2024     LABGLOM 101 02/16/2024    GLOB 2.4 02/16/2024     Lab Results   Component Value Date    CHOL 142 02/16/2024    TRIG 70 02/16/2024    HDL 44 02/16/2024    LDL 84.0 02/16/2024    VLDL 14.0 02/16/2024    CHOLHDLRATIO 3.2 02/16/2024     Lab Results   Component Value Date    PSA 2.850 02/16/2024         MRI Result (most recent):  MRI LUMBAR SPINE WO CONTRAST 04/21/2015    Narrative  CLINICAL DATA:  Lumbar pain with left leg  radiculopathy in an L5  distribution. Hyperextension injury 09/2014.    EXAM:  MRI LUMBAR SPINE WITHOUT CONTRAST    TECHNIQUE:  Multiplanar, multisequence MR imaging of the lumbar spine was  performed. No intravenous contrast was administered.    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  Numbering preserved from that study, with the transitional segment  considered a nearly completely lumbarized S1.    Vertebral alignment is normal. Vertebral body heights are preserved.  A small L1 superior endplate Schmorl's node is noted. Moderate disc  space height loss and mild degenerative endplate edema are present  at L4-5. Disc desiccation is noted at L4-5 and L5-S1. There is also  moderate disc space height loss at L1-2. Conus medullaris is normal  in signal and terminates at the mid to upper L2 level. Paraspinal  soft tissues are unremarkable.    L1-2: Mild circumferential disc bulging results in mild left greater  than right lateral recess narrowing without spinal canal or neural  foraminal stenosis.    L2-3: Mild disc bulging results in mild left lateral recess  narrowing without spinal canal or neural foraminal stenosis.    L3-4: Mild disc bulging and slight facet hypertrophy result in  minimal bilateral lateral recess narrowing without spinal canal or  neural foraminal stenosis.    L4-5: Moderate-sized left paracentral disc extrusion with caudal  migration to the L5 pedicle level and mild left facet  hypertrophy  result in moderate left lateral recess stenosis, potentially  affecting the left L5 nerve root. There is minimal right lateral  recess stenosis and mild overall spinal stenosis. Disc bulging  asymmetric to the left and disc space height loss contribute to  moderate left neural foraminal stenosis.    L5-S1: Mild disc bulging, small right foraminal/ extraforaminal disc  osteophyte complex, and mild facet hypertrophy result in mild  bilateral lateral recess stenosis and moderate right and mild left  neural foraminal stenosis. No spinal stenosis.    S1-2:  Mild facet arthrosis without disc herniation or stenosis.    IMPRESSION:  1. Transitional lumbosacral anatomy as above.  2. 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  3. Mild spinal stenosis and moderate left neural foraminal stenosis  at L4-5.  4. Moderate right neural foraminal stenosis at L5-S1.      Electronically Signed  By: Dasie Hamburg M.D.  On: 04/21/2015 13:12            An electronic signature was used to authenticate this note.    --Vena CHRISTELLA Burnet, MD   "

## 2024-02-23 NOTE — Telephone Encounter (Signed)
"  Please advise pt that cardiologist got back to me regarding question sent after visit with ecg changes and his symptoms.  Read as left bundle branch block which was new from ECG done two yrs ago which can indicate possible prior heart attack or change in structure especially with his recent symptoms and given higher risk for heart disease with his diabetes / age / history of hypertension. He agreed with being evaluated with cardiology and I see an appt with Dr Eino in three months; also agreed that getting CTA that we briefly discussed during appt would be appropriate first evaluation in meantime. So, I ordered CTA - this is CT that uses dye to see if there is any plaque present in arteries and looks at blood flow in those arteries. If abnormal can change urgency of referral to cardiology and usually will get in within a few days. The test does need heart rate to be in correct range for appropriate interpretation, so a one time rx of metoprolol  was sent which he may need to use day of CT appt. Instructions are clear on bottle - will take 1-2 tablets of metoprolol  based on his HR 2 hours prior to scheduled test. There are also orders for when he is seen for them to administer additional medication if needed. Please let pt and pt sister know regarding new test order.   "

## 2024-02-24 LAB — HM DIABETES EYE EXAM: Diabetic Retinopathy: POSITIVE

## 2024-02-24 MED ORDER — METOPROLOL TARTRATE 50 MG PO TABS
50 | ORAL_TABLET | ORAL | 0 refills | 90.00000 days | Status: DC
Start: 2024-02-24 — End: 2024-03-21

## 2024-02-24 NOTE — Telephone Encounter (Signed)
"  Called spoke to patient and sister Maeola read them message from [name]. Sister will text joe with Central Scheduling number to call to set up CTA. No further questions.  "

## 2024-02-29 ENCOUNTER — Emergency Department: Admit: 2024-02-29 | Payer: Worker's Compensation | Primary: Family Medicine

## 2024-02-29 ENCOUNTER — Inpatient Hospital Stay
Admit: 2024-02-29 | Discharge: 2024-02-29 | Disposition: A | Discharge status: Home / Self Care | Payer: Worker's Compensation | Arrived: WI

## 2024-02-29 LAB — CBC WITH AUTO DIFFERENTIAL
Basophils %: 0.9 % (ref 0.0–2.0)
Basophils Absolute: 0 x10e3/mcL (ref 0.0–0.2)
Eosinophils %: 2.1 % (ref 0.0–7.0)
Eosinophils Absolute: 0.1 x10e3/mcL (ref 0.0–0.5)
Hematocrit: 38.5 % (ref 38.0–52.0)
Hemoglobin: 13.2 g/dL (ref 13.0–17.3)
Immature Grans (Abs): 0.01 x10e3/mcL (ref 0.00–0.06)
Immature Granulocytes %: 0.2 % (ref 0.0–0.6)
Lymphocytes Absolute: 1.9 x10e3/mcL (ref 1.0–3.2)
Lymphocytes: 32.8 % (ref 15.0–45.0)
MCH: 27.7 pg (ref 27.0–34.5)
MCHC: 34.3 g/dL (ref 30.0–36.0)
MCV: 80.7 fL — ABNORMAL LOW (ref 84.0–100.0)
MPV: 10 fL (ref 7.0–12.2)
Monocytes %: 4.8 % (ref 4.0–12.0)
Monocytes Absolute: 0.3 x10e3/mcL (ref 0.3–1.0)
NRBC Absolute: 0 x10e3/mcL (ref 0.00–0.01)
NRBC Automated: 0 % (ref 0.0–0.2)
Neutrophils %: 59.2 % (ref 42.0–74.0)
Neutrophils Absolute: 3.4 x10e3/mcL (ref 1.6–7.3)
Platelets: 204 x10e3/mcL (ref 140–440)
RBC: 4.77 x10e6/mcL (ref 4.00–5.60)
RDW: 12.7 % (ref 10.0–17.0)
WBC: 5.8 x10e3/mcL (ref 3.8–10.6)

## 2024-02-29 LAB — BASIC METABOLIC PANEL
Anion Gap: 6 mmol/L (ref 2–17)
BUN: 25 mg/dL — ABNORMAL HIGH (ref 6–20)
CO2: 26 mmol/L (ref 22–29)
Calcium: 8.7 mg/dL (ref 8.5–10.7)
Chloride: 99 mmol/L (ref 98–107)
Creatinine: 1.1 mg/dL (ref 0.7–1.3)
Est, Glom Filt Rate: 79 mL/min/1.73m?? (ref >=60)
Glucose: 323 mg/dL — ABNORMAL HIGH (ref 70–99)
Osmolaliy Calculated: 279 mosm/kg (ref 270–287)
Sodium: 131 mmol/L — ABNORMAL LOW (ref 135–145)

## 2024-02-29 LAB — MAGNESIUM: Magnesium: 1.8 mg/dL (ref 1.6–2.6)

## 2024-02-29 LAB — TROPONIN
Troponin, High Sensitivity: 16 ng/L (ref 0–22)
Troponin, High Sensitivity: 13 ng/L (ref 0–22)

## 2024-02-29 LAB — BRAIN NATRIURETIC PEPTIDE: NT Pro-BNP: 42 pg/mL (ref 0–125)

## 2024-02-29 LAB — TSH REFLEX TO FT4: TSH: 1.39 u[IU]/mL (ref 0.358–3.740)

## 2024-02-29 MED ORDER — ASPIRIN 81 MG PO CHEW
81 | Freq: Once | ORAL | Status: AC
Start: 2024-02-29 — End: 2024-02-29
  Administered 2024-02-29: 15:00:00 324 mg via ORAL

## 2024-02-29 MED ORDER — AMLODIPINE BESYLATE 5 MG PO TABS
5 | ORAL_TABLET | Freq: Every day | ORAL | 0 refills | 90.00000 days | Status: DC
Start: 2024-02-29 — End: 2024-04-11

## 2024-02-29 MED ORDER — AMLODIPINE BESYLATE 5 MG PO TABS
5 | Freq: Every day | ORAL | Status: DC
Start: 2024-02-29 — End: 2024-02-29
  Administered 2024-02-29: 18:00:00 5 mg via ORAL

## 2024-02-29 MED ORDER — NITROGLYCERIN 0.4 MG SL SUBL
0.4 | SUBLINGUAL | Status: DC | PRN
Start: 2024-02-29 — End: 2024-02-29
  Administered 2024-02-29: 15:00:00 0.4 mg via SUBLINGUAL

## 2024-02-29 MED FILL — NITROGLYCERIN 0.4 MG SL SUBL: 0.4 mg | SUBLINGUAL | Qty: 25 | Fill #0

## 2024-02-29 MED FILL — AMLODIPINE BESYLATE 5 MG PO TABS: 5 mg | ORAL | Qty: 1 | Fill #0

## 2024-02-29 MED FILL — ASPIRIN 81 MG PO CHEW: 81 mg | ORAL | Qty: 4 | Fill #0

## 2024-02-29 NOTE — ED Provider Notes (Signed)
 "[facility] EM[facility]GENCY DEPT  EMERGENCY DEPARTMENT ENCOUNTER      Pt Name: Harry Mitchell  MRN: [id number]  Birthdate January 21, 1971  Date/Time of evaluation: 02/29/2024  Provider evaluation time: 02/29/24 0935  Provider: Addie SHAUNNA Pleasant, MD    CHIEF COMPLAINT       Chief Complaint   Patient presents with    Arm Pain    Chest Pain     C/o Right arm/chest/neck pain along with a irregular heart rate          HISTORY OF PRESENT ILLNESS      Harry Mitchell is a 53 y.o. male who presents to the emergency department with chest pain pain rating down the right arm.    HPI  Patient is a 53 year old male with history of hypertension on losartan  as well as insulin -dependent diabetes that presents to the emergency department with intermittent episodes of chest pressure as well as pain radiating down the right neck and the right arm.  Patient reports symptoms over the past week or so.  Was seen by his primary care doctor last Thursday and was diagnosed with an abnormal EKG with further workup ordered and pending.    Patient without history of known coronary artery disease however does have a strong family history.  He reports that his brother died of heart disease in his 46s.  No prior formal cardiac evaluation for this patient.    Patient denies any significant shortness of breath, leg swelling or pain with deep breathing.  He does not use any tobacco products.    Patient is not on aspirin .  No other blood thinners reported.  No fevers or chills.  No cough cold or congestion.  No URI symptoms reported.  Nursing Notes were reviewed.    REVIEW OF SYSTEMS         Review of Systems   Constitutional:  Negative for fever and unexpected weight change.   HENT:  Negative for congestion.    Respiratory:  Positive for chest tightness. Negative for shortness of breath.    Cardiovascular:  Negative for chest pain, palpitations and leg swelling.   Gastrointestinal:  Negative for abdominal pain, diarrhea, nausea and vomiting.   Genitourinary:   Negative for flank pain.   Musculoskeletal:  Positive for neck pain. Negative for back pain.   Skin:  Negative for rash.   Neurological:  Negative for seizures, weakness and headaches.   Psychiatric/Behavioral:  Negative for confusion.        Except as noted above the remainder of the review of systems was reviewed and negative.       PAST MEDICAL HISTORY     Past Medical History:   Diagnosis Date    Hyperlipidemia     Hypertension     Type 2 diabetes mellitus without complication          SURGICAL HISTORY       Past Surgical History:   Procedure Laterality Date    ANTERIOR CRUCIATE LIGAMENT REPAIR Right     FINGER AMPUTATION Left     Left Long finger    PAIN MANAGEMENT PROCEDURE N/A 06/09/2023    WC LESI L4-5 performed by [name] Ronalee ORN, MD at [facility] PAIN MANAGEMENT     PAIN MANAGEMENT PROCEDURE N/A 06/23/2023    WC/LESI #2; L4-5 performed by [name] Ronalee ORN, MD at [facility] PAIN MANAGEMENT     PAIN MANAGEMENT PROCEDURE N/A 01/11/2024    WC/LESI #3; L4-5 performed by [name] Ronalee ORN, MD at  [facility] PAIN MANAGEMENT          CURRENT MEDICATIONS       Discharge Medication List as of 02/29/2024 12:25 PM        CONTINUE these medications which have NOT CHANGED    Details   atorvastatin  (LIPITOR) 40 MG tablet Take 1 tablet by mouth daily, Disp-90 tablet, R-1Normal      ibuprofen  (ADVIL ;MOTRIN ) 800 MG tablet Take 1 tablet by mouth every 8 hours as needed for Pain, Disp-90 tablet, R-2Normal      gabapentin  (NEURONTIN ) 300 MG capsule Take 1 capsule by mouth 2 times daily for 180 days., Disp-60 capsule, R-5Normal      hydroCHLOROthiazide  (HYDRODIURIL ) 25 MG tablet Take 1 tablet by mouth daily, Disp-90 tablet, R-3Normal      losartan  (COZAAR ) 100 MG tablet Take 1 tablet by mouth daily, Disp-90 tablet, R-3Normal      sildenafil  (REVATIO ) 20 MG tablet Take 1 tablet by mouth daily as needed (ED; take 30 min prior to sexual activity), Disp-10 tablet, R-5Normal      metoprolol  tartrate (LOPRESSOR ) 50 MG tablet Two hours before your CTA  test check your heart rate, take by mouth 1 tablet if your heart rate is 55-70, 2 tablets if your heart rate is 71-80, 3 tablets if heart rate greater than 80, and no tablets if heart rate is less than 55., Disp-3 tablet, R-0No rmal      Continuous Glucose Sensor (FREESTYLE LIBRE 3 PLUS SENSOR) MISC Change sensor every 15 days, Disp-6 each, R-1Normal      insulin  lispro, 1 Unit Dial , (HUMALOG  KWIKPEN) 100 UNIT/ML SOPN Take 6-8 units with breakfast and dinner. MAX TDD: 16 units, Disp-5 Adjustable Dose Pre-filled Pen Syringe, R-3Normal      insulin  lispro (HUMALOG ,ADMELOG ) 100 UNIT/ML SOLN injection vial Max TDD 90 units for continuous SQ infusion with insulin  pump, Disp-90 mL, R-1Normal      ferrous sulfate  (IRON 325) 325 (65 Fe) MG tablet Take 1 tablet by mouth daily (with breakfast), Disp-90 tablet, R-1Normal      FLUoxetine  (PROZAC ) 40 MG capsule Take 1 capsule by mouth daily, Disp-90 capsule, R-1Normal      !! Insulin  Pen Needle 32G X 6 MM MISC Disp-400 each, R-3, NormalUse one needle per injection -has 4 injections / day      !! Insulin  Pen Needle (PEN NEEDLES) 32G X 6 MM MISC 1 each by Does not apply route 2 times daily, Disp-200 each, R-3Normal      insulin  glargine (LANTUS  SOLOSTAR) 100 UNIT/ML injection pen Inject 45-50 Units into the skin daily, Disp-5 mL, R-109/30/2024 12:17:16 PMNormal      albuterol  sulfate HFA (VENTOLIN  HFA) 108 (90 Base) MCG/ACT inhaler Inhale 2 puffs into the lungs 4 times daily as needed for Wheezing, Disp-18 g, R-0Normal       !! - Potential duplicate medications found. Please discuss with provider.          ALLERGIES     Sulfa antibiotics    FAMILY HISTORY       Family History   Problem Relation Age of Onset    Diabetes Brother     Colon Cancer Maternal Grandmother     Diabetes Paternal Grandfather     Diabetes Paternal Uncle           SOCIAL HISTORY       Social History     Socioeconomic History    Marital status: Single   Tobacco Use    Smoking status: Former  Current  packs/day: 0.00     Average packs/day: 1 pack/day for 14.3 years (14.3 ttl pk-yrs)     Types: Cigarettes     Start date: 03/09/2007     Quit date: 07/06/2021     Years since quitting: 2.6    Smokeless tobacco: Never   Substance and Sexual Activity    Alcohol use: Not Currently    Drug use: Not Currently     Types: Methamphetamines (Crystal Meth)    Sexual activity: Yes     Partners: Female       SCREENINGS         Glasgow Coma Scale  Eye Opening: Spontaneous  Best Verbal Response: Oriented  Best Motor Response: Obeys commands  Glasgow Coma Scale Score: 15                     CIWA Assessment  BP: (!) 145/84  Pulse: 65                 PHYSICAL EXAM         ED Triage Vitals [02/29/24 0934]   BP Girls Systolic BP Percentile Girls Diastolic BP Percentile Boys Systolic BP Percentile Boys Diastolic BP Percentile Temp Temp Source Pulse   (!) 161/110 -- -- -- -- 98.1 F (36.7 C) Oral 63      Respirations SpO2 Height Weight - Scale       16 100 % 1.727 m (5' 8) 98.4 kg (217 lb)           Physical Exam  Vitals and nursing note reviewed.   Constitutional:       General: He is not in acute distress.     Appearance: He is not ill-appearing or toxic-appearing.   HENT:      Head: Atraumatic.   Neck:      Comments: Some tenderness to the right paracervical musculature extending to the right trapezius.  Mild discomfort with range of motion of the right shoulder.  Cardiovascular:      Rate and Rhythm: Normal rate and regular rhythm.      Pulses: Normal pulses.   Pulmonary:      Effort: Pulmonary effort is normal. No respiratory distress.      Breath sounds: Normal breath sounds.   Abdominal:      General: Abdomen is flat.      Palpations: Abdomen is soft.      Tenderness: There is no abdominal tenderness. There is no guarding or rebound.   Musculoskeletal:         General: Normal range of motion.      Cervical back: Tenderness present.   Skin:     General: Skin is warm and dry.   Neurological:      General: No focal deficit present.       Mental Status: He is alert.   Psychiatric:         Mood and Affect: Mood normal.         DIAGNOSTIC RESULTS     EKG:     Sinus rhythm.  Rate of 63.  Left bundle branch block.  Negative for Sgarbossa criteria.  No significant change from 02/23/2024.  However new left bundle and compared to October 02, 2021.    RADIOLOGY:   Plain radiographic images are visualized and preliminarily interpreted by myself (Emergency Physician)  with the below findings:        Non-plain film images such as CT, Ultrasound and MRI are  read by the radiologist.  Interpretation per the Radiologist below, if available at the time of this note:    XR CHEST PORTABLE   Final Result      No acute process identified.         HS:Y              LABS:  Labs Reviewed   CBC WITH AUTO DIFFERENTIAL - Abnormal; Notable for the following components:       Result Value    MCV 80.7 (*)     All other components within normal limits   BASIC METABOLIC PANEL - Abnormal; Notable for the following components:    Sodium 131 (*)     Potassium Hemolyzed (*)     Glucose 323 (*)     BUN 25 (*)     All other components within normal limits   BRAIN NATRIURETIC PEPTIDE   MAGNESIUM   TROPONIN   TROPONIN   TSH REFLEX TO FT4   ADD ON LAB TEST    Narrative:     Specify Req. Test (1 Test/Order)->bmp       All other labs were within normal range or not returned as of this dictation.    EMERGENCY DEPARTMENT COURSE and DIFFERENTIAL DIAGNOSIS/MDM:   Vitals:    Vitals:    02/29/24 1100 02/29/24 1115 02/29/24 1230 02/29/24 1300   BP: (!) 156/80 (!) 144/102 137/81 (!) 145/84   Pulse: 67 69 67 65   Resp: 14 15 15 15    Temp:       TempSrc:       SpO2: 94% 96% 97% 96%   Weight:       Height:             Medical Decision Making    Patient with history as above presented with intermittent pain rating down the right side of his neck to his arm as well as intermittent chest discomfort. History obtained from patient.    Patient was nontoxic, stable. Exam as above.     EKG reviewed.   Labs  reviewed.   Independently reviewed imaging.   Reviewed external records.     The patient presents with intermittent right-sided neck and arm discomfort as well as intermittent chest discomfort that has been going on for several weeks. Given the results of the CXR, this is not likely caused by a pneumothorax. Given the pt's lack of infectious symptoms, no cough or fever, and the clear chest x-ray, this is likely not caused by pneumonia. The pt has no signs of aortic dissection on CXR and does not describe pain consistent with this disease process. The EKG obtained does not show any ST elevation or depression, therefore STEMI or pericarditis are low on the differential.  EKG showing a left bundle branch block unchanged from prior in his primary care doctor's office.  PE was considered, however given the pt is not tachycardic, hypoxic or tachypneic, no signs or symptoms of DVT on exam, and without significant risk factors for clotting diathesis I do not feel the pt warrants any further workup as this is not likely the cause of symptoms today.    Consideration was given for admission, discussed with Dr. Nivia with [facility] cardiology who has reviewed patient's labs and imaging and current presentation.  He is not recommending admission at this time but would have him follow-up closely for his coronary CT imaging with strict return precautions.      Discussed need to follow up  diagnostics, including incidental findings. Discharged with instructions to obtain outpatient follow up of patient's symptoms and findings, with strict return precautions if patient develops new or worsening symptoms.        Problems Addressed:  Chest discomfort: acute illness or injury    Amount and/or Complexity of Data Reviewed  External Data Reviewed: labs, ECG and notes.  Labs: ordered. Decision-making details documented in ED Course.  Radiology: ordered and independent interpretation performed. Decision-making details documented in ED  Course.  ECG/medicine tests: ordered and independent interpretation performed. Decision-making details documented in ED Course.    Risk  OTC drugs.  Prescription drug management.           REASSESSMENT     ED Course as of 02/29/24 1404   Wed Feb 29, 2024   1045 Negative troponin x 2.  Currently elevated blood pressures.  Discussed with Dr. Nivia with [facility] cardiology who has reviewed patient's EKG, labs.  I have reviewed and discussed patient's current presentation.  He does not feel the patient needs to be admitted at this point in time and recommends coronary CT imaging on the 29th.  He would like me to change over his HCTZ to amlodipine  5 mg daily. [DG]      ED Course User Index  [DG] Vana Addie SQUIBB, MD             CONSULTS:  None    PROCEDURES:  Unless otherwise noted below, none     Procedures        FINAL IMPRESSION      1. Chest discomfort          DISPOSITION/PLAN   DISPOSITION Decision To Discharge 02/29/2024 12:25:43 PM      PATIENT REFERRED TO:  [name], MD  [address]  [address]  [place] [address]  [phone]    In 2 days      Runquist, Noretta Mori, MD  [address]  [address] [place] [address] [place] [address]  [phone]    Schedule an appointment as soon as possible for a visit in 2 days        DISCHARGE MEDICATIONS:  Discharge Medication List as of 02/29/2024 12:25 PM        START taking these medications    Details   amLODIPine  (NORVASC ) 5 MG tablet Take 1 tablet by mouth daily, Disp-30 tablet, R-0Normal             (Please note that portions of this note were completed with a voice recognition program.  Efforts were made to edit the dictations but occasionally words are mis-transcribed.)    Addie SQUIBB Vana, MD (electronically signed)  Attending Emergency Physician            Vana Addie SQUIBB, MD  02/29/24 1404    "

## 2024-02-29 NOTE — Discharge Instructions (Signed)
"  Cardiology is recommending that you stop taking your hydrochlorothiazide  and start taking amlodipine  5 mg daily.  "

## 2024-03-01 LAB — EKG 12-LEAD
P Axis: 61 degrees
P-R Interval: 160 ms
Q-T Interval: 456 ms
QRS Duration: 148 ms
QTc Calculation (Bazett): 464 ms
R Axis: -71 degrees
T Axis: 72 degrees
Ventricular Rate: 63 {beats}/min

## 2024-03-05 ENCOUNTER — Encounter

## 2024-03-05 ENCOUNTER — Inpatient Hospital Stay: Admit: 2024-03-05 | Payer: PRIVATE HEALTH INSURANCE | Attending: Family Medicine | Primary: Family Medicine

## 2024-03-05 ENCOUNTER — Inpatient Hospital Stay: Admit: 2024-03-05 | Payer: Worker's Compensation | Primary: Family Medicine

## 2024-03-05 VITALS — BP 133/74 | HR 64 | Resp 19

## 2024-03-05 DIAGNOSIS — I2510 Atherosclerotic heart disease of native coronary artery without angina pectoris: Secondary | ICD-10-CM

## 2024-03-05 DIAGNOSIS — R9431 Abnormal electrocardiogram [ECG] [EKG]: Principal | ICD-10-CM

## 2024-03-05 MED ORDER — NORMAL SALINE FLUSH 0.9 % IV SOLN
0.9 | INTRAVENOUS | Status: DC | PRN
Start: 2024-03-05 — End: 2024-03-09

## 2024-03-05 MED ORDER — NITROGLYCERIN 0.4 MG SL SUBL
0.4 | SUBLINGUAL | Status: AC
Start: 2024-03-05 — End: 2024-03-05

## 2024-03-05 MED ORDER — NITROGLYCERIN 0.4 MG SL SUBL
0.4 | Freq: Once | SUBLINGUAL | Status: AC | PRN
Start: 2024-03-05 — End: 2024-03-05

## 2024-03-05 MED ORDER — NITROGLYCERIN 0.4 MG SL SUBL
0.4 | Freq: Once | SUBLINGUAL | Status: DC | PRN
Start: 2024-03-05 — End: 2024-03-09

## 2024-03-05 MED ORDER — NORMAL SALINE FLUSH 0.9 % IV SOLN
0.9 | Freq: Two times a day (BID) | INTRAVENOUS | Status: DC
Start: 2024-03-05 — End: 2024-03-09

## 2024-03-05 MED ORDER — METOPROLOL TARTRATE 5 MG/5ML IV SOLN
5 | INTRAVENOUS | Status: AC
Start: 2024-03-05 — End: 2024-03-05

## 2024-03-05 MED ORDER — NITROGLYCERIN 0.4 MG SL SUBL
0.4 | Freq: Once | SUBLINGUAL | Status: AC | PRN
Start: 2024-03-05 — End: 2024-03-05
  Administered 2024-03-05: 14:00:00 0.8 mg via SUBLINGUAL

## 2024-03-05 MED ORDER — IOPAMIDOL 76 % IV SOLN
76 | Freq: Once | INTRAVENOUS | Status: AC | PRN
Start: 2024-03-05 — End: 2024-03-05
  Administered 2024-03-05: 14:00:00 100 mL via INTRAVENOUS

## 2024-03-05 MED ORDER — METOPROLOL TARTRATE 5 MG/5ML IV SOLN
5 | INTRAVENOUS | Status: DC | PRN
Start: 2024-03-05 — End: 2024-03-09

## 2024-03-05 MED ORDER — SODIUM CHLORIDE 0.9 % IV SOLN
0.9 | INTRAVENOUS | Status: DC | PRN
Start: 2024-03-05 — End: 2024-03-09

## 2024-03-05 MED FILL — METOPROLOL TARTRATE 5 MG/5ML IV SOLN: 5 mg/mL | INTRAVENOUS | Qty: 20 | Fill #0

## 2024-03-05 MED FILL — NITROGLYCERIN 0.4 MG SL SUBL: 0.4 mg | SUBLINGUAL | Qty: 25 | Fill #0

## 2024-03-05 NOTE — Telephone Encounter (Signed)
"  Sherri R.N. sibling  notified us  that patient reports 2 episodes CP before Christmas went to ED. She would like you to review the results of  CTA and  Heartflow as Radiology  recommended a  cardiac catherization.Thank you  "

## 2024-03-05 NOTE — Result Encounter Note (Signed)
"  CTA with area of densely calcified plaque - approx 50% stenosis but flow indicate possibly significant stenosis for which cath should be considered. Especially with recent symptoms need urgent eval with cardiology - urgent referral placed to cardiology- have seen people get appts next day though not sure how holiday will effect - if repeat episode of chest pain should call 911 / go back to ER in meantime . Also should start baby aspirin  in meantime   "

## 2024-03-13 ENCOUNTER — Ambulatory Visit
Admit: 2024-03-13 | Discharge: 2024-03-13 | Payer: PRIVATE HEALTH INSURANCE | Attending: Interventional Cardiology | Primary: Family Medicine

## 2024-03-13 VITALS — BP 170/90 | HR 70 | Wt 223.0 lb

## 2024-03-13 DIAGNOSIS — R072 Precordial pain: Principal | ICD-10-CM

## 2024-03-13 NOTE — Progress Notes (Signed)
 Date:  March 13, 2024  Patient name: Harry Mitchell  Date of Birth: 1970/12/23    CARDIOLOGY CLINIC OFFICE NOTE      HISTORY OF PRESENT ILLNESS          Uzziel Russey is a 54 y.o. male who presents to cardiology clinic initial evaluation visit.  History of Present Illness  The patient is a male who presents for evaluation of an irregular heartbeat.    He was referred urgently due to the detection of an irregular heartbeat during a physical examination, which revealed a left bundle branch block on his EKG. He has been experiencing symptoms suggestive of cardiac issues, including a decrease in pulse rate during physical activities such as landscaping, walking, or running. This was confirmed by a pulse oximeter reading of 38 at the hospital. He sought emergency care on 02/29/2024 due to episodes of right shoulder pain, neck pain, and chest pain with exertion at work. His symptoms began around 02/09/2024, initially presenting as acid reflux, but have since progressed. He recalls an episode of radiating pain from his neck to his arm while using a backpack blower, which led to his hospitalization. He also reports fatigue and shortness of breath when ascending stairs, a new symptom since December 2025. He has noticed mild leg swelling but does not experience any chest discomfort or shortness of breath at night.    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 He is currently on amlodipine  and atorvastatin  for high blood pressure and cholesterol management.    SOCIAL HISTORY  Occupations: Administrator, Nurse  Exercise: Engages in physical activities such as walking, using a blower, and running.  Recreational Drugs: Used methamphetamine but has not used anything since 07/06/2021.         PAST MEDICAL, SOCIAL AND FAMILY  HISTORY          Past Medical History:   has a past medical history of Hyperlipidemia, Hypertension, and Type 2 diabetes mellitus without complication.    Past Surgical History:   has a past surgical history that includes Anterior cruciate ligament repair (Right); Finger amputation (Left); Pain management procedure (N/A, 06/09/2023); Pain management procedure (N/A, 06/23/2023); and Pain management procedure (N/A, 01/11/2024).     Social History:   reports that he quit smoking about 2 years ago. His smoking use included cigarettes. He started smoking about 17 years ago. He has a 14.3 pack-year smoking history. He has never used smokeless tobacco. He reports that he does not currently use alcohol. He reports that he does not currently use drugs after having used the following drugs: Methamphetamines (Crystal Meth).     Family History: family history includes Colon Cancer in his maternal grandmother; Diabetes in his brother, paternal grandfather, and paternal uncle.    Problem List:  Patient Active Problem List   Diagnosis    Essential hypertension, benign    History of drug abuse in remission (HCC)    Hyperlipemia    Anxiety    Depression    Acute hepatitis A    Erectile dysfunction    Hypertension    Diabetes mellitus (HCC)    Hypoglycemia due to insulin     Insomnia    Lumbar pain with radiation down left leg    Abscess    Obesity, unspecified    Osteomyelitis (HCC)    Osteomyelitis of finger of left hand (HCC)    Pneumonia of both lungs due to infectious organism    Pulp  abscess of finger of left hand    Septic arthritis (HCC)    Trigger thumb of left hand    Lumbar disc herniation    LADA (latent autoimmune diabetes in adults), managed as type 1 (HCC)    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)    Otitis externa    Acute non-recurrent maxillary sinusitis    Lumbar radiculopathy          MEDICATIONS AND ALLERGIES           Current Outpatient Medications:     amLODIPine  (NORVASC ) 5 MG tablet,  Take 1 tablet by mouth daily, Disp: 30 tablet, Rfl: 0    atorvastatin  (LIPITOR) 40 MG tablet, Take 1 tablet by mouth daily, Disp: 90 tablet, Rfl: 1    ibuprofen  (ADVIL ;MOTRIN ) 800 MG tablet, Take 1 tablet by mouth every 8 hours as needed for Pain, Disp: 90 tablet, Rfl: 2    gabapentin  (NEURONTIN ) 300 MG capsule, Take 1 capsule by mouth 2 times daily for 180 days., Disp: 60 capsule, Rfl: 5    hydroCHLOROthiazide  (HYDRODIURIL ) 25 MG tablet, Take 1 tablet by mouth daily, Disp: 90 tablet, Rfl: 3    losartan  (COZAAR ) 100 MG tablet, Take 1 tablet by mouth daily, Disp: 90 tablet, Rfl: 3    sildenafil  (REVATIO ) 20 MG tablet, Take 1 tablet by mouth daily as needed (ED; take 30 min prior to sexual activity), Disp: 10 tablet, Rfl: 5    insulin  lispro (HUMALOG ,ADMELOG ) 100 UNIT/ML SOLN injection vial, Max TDD 90 units for continuous SQ infusion with insulin  pump, Disp: 90 mL, Rfl: 1    ferrous sulfate  (IRON 325) 325 (65 Fe) MG tablet, Take 1 tablet by mouth daily (with breakfast), Disp: 90 tablet, Rfl: 1    FLUoxetine  (PROZAC ) 40 MG capsule, Take 1 capsule by mouth daily, Disp: 90 capsule, Rfl: 1    insulin  glargine (LANTUS  SOLOSTAR) 100 UNIT/ML injection pen, Inject 45-50 Units into the skin daily, Disp: 5 mL, Rfl: 1    albuterol  sulfate HFA (VENTOLIN  HFA) 108 (90 Base) MCG/ACT inhaler, Inhale 2 puffs into the lungs 4 times daily as needed for Wheezing, Disp: 18 g, Rfl: 0    metoprolol  tartrate (LOPRESSOR ) 50 MG tablet, Two hours before your CTA test check your heart rate, take by mouth 1 tablet if your heart rate is 55-70, 2 tablets if your heart rate is 71-80, 3 tablets if heart rate greater than 80, and no tablets if heart rate is less than 55., Disp: 3 tablet, Rfl: 0    Continuous Glucose Sensor (FREESTYLE LIBRE 3 PLUS SENSOR) MISC, Change sensor every 15 days, Disp: 6 each, Rfl: 1    insulin  lispro, 1 Unit Dial , (HUMALOG  KWIKPEN) 100 UNIT/ML SOPN, Take 6-8 units with breakfast and dinner. MAX TDD: 16 units (Patient not  taking: Reported on 03/13/2024), Disp: 5 Adjustable Dose Pre-filled Pen Syringe, Rfl: 3    Insulin  Pen Needle 32G X 6 MM MISC, Use one needle per injection -has 4 injections / day (Patient not taking: Reported on 03/13/2024), Disp: 400 each, Rfl: 3    Insulin  Pen Needle (PEN NEEDLES) 32G X 6 MM MISC, 1 each by Does not apply route 2 times daily (Patient not taking: Reported on 02/23/2024), Disp: 200 each, Rfl: 3    Allergies:  Sulfa antibiotics      REVIEW OF SYSTEMS         Review of Systems   Constitutional:  Negative for chills, fatigue, fever and unexpected weight  change.   HENT:  Negative for hearing loss, nosebleeds, sore throat, tinnitus and voice change.    Eyes:  Positive for visual disturbance.   Respiratory:  Positive for chest tightness and shortness of breath. Negative for apnea, cough and wheezing.    Cardiovascular:  Positive for chest pain and palpitations. Negative for leg swelling (claudication).   Gastrointestinal:  Positive for constipation and diarrhea. Negative for anal bleeding and blood in stool.   Endocrine: Positive for polydipsia.   Genitourinary:  Negative for difficulty urinating, dysuria, hematuria and urgency.   Musculoskeletal:  Negative for myalgias.   Skin:  Negative for rash (itchiness) and wound.   Neurological:  Positive for dizziness, weakness, light-headedness and numbness (tingling). Negative for syncope.   Hematological:  Does not bruise/bleed easily.   Psychiatric/Behavioral:  Negative for sleep disturbance.          PHYSICAL EXAM          BP (!) 170/90 (Patient Position: Sitting)   Pulse 70   Wt 101.2 kg (223 lb)   SpO2 97%   BMI 33.91 kg/m      Gen:  Alert and oriented 3 in no apparent distress  HEENT:  Normocephalic and atraumatic; clear oropharynx  Cardiovascular: Regular rate and rhythm, normal S1/2, no murmurs, no JVD  Lungs: Clear to auscultation bilaterally  Abdomen: Nontender nondistended  Extremities: No clubbing cyanosis or edema  Neuro:  No focal deficits;  ambulated into the office today without issue  Psych:  Appropriate mood and affect  Derm:  No new rashes or lesions      CARDIAC DIAGNOSTICS                 Encounter Date: 02/29/24   EKG 12 Lead (Chest Pain)   Result Value    Ventricular Rate 63    P-R Interval 160    QRS Duration 148    Q-T Interval 456    QTc Calculation (Bazett) 464    P Axis 61    R Axis -71    T Axis 72    Diagnosis      SINUS RHYTHM  ABNORMAL LEFT AXIS DEVIATION [QRS AXIS < -30]  LEFT BUNDLE BRANCH BLOCK [120+ MS QRS DURATION, 80+ MS Q/S IN V1/V2, 85+ MS R  IN I/AVL/V5/V6]  ABNORMAL ECG    Confirmed by Masindet-MD, Sarbabi ([id number]) on 03/01/2024 5:17:49 PM             Labs:    Lab Results   Component Value Date    WBC 5.8 02/29/2024    HGB 13.2 02/29/2024    HCT 38.5 02/29/2024    PLT 204 02/29/2024    CHOL 142 02/16/2024    TRIG 70 02/16/2024    HDL 44 02/16/2024    ALT 33 02/16/2024    AST 35 02/16/2024    NA 131 (L) 02/29/2024    K Hemolyzed (A) 02/29/2024    CL 99 02/29/2024    CREATININE 1.1 02/29/2024    BUN 25 (H) 02/29/2024    CO2 26 02/29/2024    TSH 1.390 02/29/2024    PSA 2.850 02/16/2024    LABA1C 7.3 (H) 02/16/2024      Lab Results   Component Value Date    CHOL 142 02/16/2024    TRIG 70 02/16/2024    HDL 44 02/16/2024    LDL 84.0 02/16/2024    VLDL 14.0 02/16/2024    CHOLHDLRATIO 3.2 02/16/2024  EKG was reviewed in the office today  Sinus rhythm with LBBB    ASSESSMENT           1. Precordial pain  -     Echo (TTE) complete (PRN contrast/bubble/strain/3D); Future  -     CBC; Future  -     Basic Metabolic Panel; Future  -     Case Request Cardiac Cath Lab; Future  2. Shortness of breath  -     Echo (TTE) complete (PRN contrast/bubble/strain/3D); Future  -     CBC; Future  -     Basic Metabolic Panel; Future  -     Case Request Cardiac Cath Lab; Future  3. Coronary artery disease involving native coronary artery of native heart with angina pectoris  -     Echo (TTE) complete (PRN contrast/bubble/strain/3D); Future  -     CBC;  Future  -     Basic Metabolic Panel; Future  -     Case Request Cardiac Cath Lab; Future  4. LBBB (left bundle branch block)  -     Echo (TTE) complete (PRN contrast/bubble/strain/3D); Future  -     CBC; Future  -     Basic Metabolic Panel; Future  -     Case Request Cardiac Cath Lab; Future  5. Type 2 diabetes mellitus without complication, without long-term current use of insulin  (HCC)  -     Echo (TTE) complete (PRN contrast/bubble/strain/3D); Future  -     CBC; Future  -     Basic Metabolic Panel; Future  -     Case Request Cardiac Cath Lab; Future  6. Pure hypercholesterolemia  -     Echo (TTE) complete (PRN contrast/bubble/strain/3D); Future  -     CBC; Future  -     Basic Metabolic Panel; Future  -     Case Request Cardiac Cath Lab; Future  7. Chest pain, unspecified type  -     Echo (TTE) complete (PRN contrast/bubble/strain/3D); Future       PLAN          1. Precordial pain  The patient is a very pleasant 54 year old gentleman with history of diabetes.  He recently was found to have a new left bundle branch block underwent an ER evaluation due to chest discomfort and exertional intolerance and the workup was relatively unremarkable other than the left bundle branch block he was discharged home and underwent a coronary CTA which did reveal moderate coronary disease both calcified and noncalcified plaques.  There did not appear to be severe stenosis albeit his symptoms continue to be concerning particularly the new onset of effort intolerance as well as the new EKG changes.  We discussed further options in terms of evaluation at this point time I think it be reasonable to proceed with diagnostic catheterization to see if there is a lesion that potentially can be intervened upon to help with his symptoms.  Also of concern however is a left bundle branch block which could result in a cardiomyopathy.  He has no overt findings of volume overload but will need to proceed with echocardiography to assess his LV  performance.    I discussed the situation and plan at length with the patient and his sister at the bedside and we will plan for echocardiography followed by diagnostic cardiac catheterization.    Refrain from strenuous work until heart catheterization is completed.    2. Shortness of breath  3. Coronary artery disease involving native coronary artery of native heart with angina pectoris      4. LBBB (left bundle branch block)      5. Type 2 diabetes mellitus without complication, without long-term current use of insulin  (HCC)      6. Pure hypercholesterolemia  Given the extensive coronary atherosclerosis noted on the coronary CTA we will go ahead and be much more aggressive in regards to his lipid management increase the atorvastatin  80 mg a day and start him on ezetimibe  10 mg a day with a goal of an LDL of 50.  Ultimately we may need to consider injectable medications as well.    7. Chest pain, unspecified type      The patient (or guardian, if applicable) and other individuals in attendance with the patient were advised that Artificial Intelligence will be utilized during this visit to record, process the conversation to generate a clinical note, and support improvement of the AI technology. The patient (or guardian, if applicable) and other individuals in attendance at the appointment consented to the use of AI, including the recording.                   Patient had no questions regarding treatment plan, goals, risks or benefits and agrees to contact me or my clinic in the interim should any questions or problems arise.     Thank you for allowing me to participate in the care of your patient.      Reyes RAMAN. Rojean, MD, FACC

## 2024-03-13 NOTE — H&P (View-Only) (Signed)
 Date:  March 13, 2024  Patient name: Harry Mitchell  Date of Birth: 1970-10-25    CARDIOLOGY CLINIC OFFICE NOTE      HISTORY OF PRESENT ILLNESS          Daundre Biel is a 54 y.o. male who presents to cardiology clinic initial evaluation visit.  History of Present Illness  The patient is a male who presents for evaluation of an irregular heartbeat.    He was referred urgently due to the detection of an irregular heartbeat during a physical examination, which revealed a left bundle branch block on his EKG. He has been experiencing symptoms suggestive of cardiac issues, including a decrease in pulse rate during physical activities such as landscaping, walking, or running. This was confirmed by a pulse oximeter reading of 38 at the hospital. He sought emergency care on 02/29/2024 due to episodes of right shoulder pain, neck pain, and chest pain with exertion at work. His symptoms began around 02/09/2024, initially presenting as acid reflux, but have since progressed. He recalls an episode of radiating pain from his neck to his arm while using a backpack blower, which led to his hospitalization. He also reports fatigue and shortness of breath when ascending stairs, a new symptom since December 2025. He has noticed mild leg swelling but does not experience any chest discomfort or shortness of breath at night.    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 He is currently on amlodipine  and atorvastatin  for high blood pressure and cholesterol management.    SOCIAL HISTORY  Occupations: Administrator, Nurse  Exercise: Engages in physical activities such as walking, using a blower, and running.  Recreational Drugs: Used methamphetamine but has not used anything since 07/06/2021.         PAST MEDICAL, SOCIAL AND FAMILY  HISTORY          Past Medical History:   has a past medical history of Hyperlipidemia, Hypertension, and Type 2 diabetes mellitus without complication.    Past Surgical History:   has a past surgical history that includes Anterior cruciate ligament repair (Right); Finger amputation (Left); Pain management procedure (N/A, 06/09/2023); Pain management procedure (N/A, 06/23/2023); and Pain management procedure (N/A, 01/11/2024).     Social History:   reports that he quit smoking about 2 years ago. His smoking use included cigarettes. He started smoking about 17 years ago. He has a 14.3 pack-year smoking history. He has never used smokeless tobacco. He reports that he does not currently use alcohol. He reports that he does not currently use drugs after having used the following drugs: Methamphetamines (Crystal Meth).     Family History: family history includes Colon Cancer in his maternal grandmother; Diabetes in his brother, paternal grandfather, and paternal uncle.    Problem List:  Patient Active Problem List   Diagnosis    Essential hypertension, benign    History of drug abuse in remission (HCC)    Hyperlipemia    Anxiety    Depression    Acute hepatitis A    Erectile dysfunction    Hypertension    Diabetes mellitus (HCC)    Hypoglycemia due to insulin     Insomnia    Lumbar pain with radiation down left leg    Abscess    Obesity, unspecified    Osteomyelitis (HCC)    Osteomyelitis of finger of left hand (HCC)    Pneumonia of both lungs due to infectious organism    Pulp  abscess of finger of left hand    Septic arthritis (HCC)    Trigger thumb of left hand    Lumbar disc herniation    LADA (latent autoimmune diabetes in adults), managed as type 1 (HCC)    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)    Otitis externa    Acute non-recurrent maxillary sinusitis    Lumbar radiculopathy          MEDICATIONS AND ALLERGIES           Current Outpatient Medications:     amLODIPine  (NORVASC ) 5 MG tablet,  Take 1 tablet by mouth daily, Disp: 30 tablet, Rfl: 0    atorvastatin  (LIPITOR) 40 MG tablet, Take 1 tablet by mouth daily, Disp: 90 tablet, Rfl: 1    ibuprofen  (ADVIL ;MOTRIN ) 800 MG tablet, Take 1 tablet by mouth every 8 hours as needed for Pain, Disp: 90 tablet, Rfl: 2    gabapentin  (NEURONTIN ) 300 MG capsule, Take 1 capsule by mouth 2 times daily for 180 days., Disp: 60 capsule, Rfl: 5    hydroCHLOROthiazide  (HYDRODIURIL ) 25 MG tablet, Take 1 tablet by mouth daily, Disp: 90 tablet, Rfl: 3    losartan  (COZAAR ) 100 MG tablet, Take 1 tablet by mouth daily, Disp: 90 tablet, Rfl: 3    sildenafil  (REVATIO ) 20 MG tablet, Take 1 tablet by mouth daily as needed (ED; take 30 min prior to sexual activity), Disp: 10 tablet, Rfl: 5    insulin  lispro (HUMALOG ,ADMELOG ) 100 UNIT/ML SOLN injection vial, Max TDD 90 units for continuous SQ infusion with insulin  pump, Disp: 90 mL, Rfl: 1    ferrous sulfate  (IRON 325) 325 (65 Fe) MG tablet, Take 1 tablet by mouth daily (with breakfast), Disp: 90 tablet, Rfl: 1    FLUoxetine  (PROZAC ) 40 MG capsule, Take 1 capsule by mouth daily, Disp: 90 capsule, Rfl: 1    insulin  glargine (LANTUS  SOLOSTAR) 100 UNIT/ML injection pen, Inject 45-50 Units into the skin daily, Disp: 5 mL, Rfl: 1    albuterol  sulfate HFA (VENTOLIN  HFA) 108 (90 Base) MCG/ACT inhaler, Inhale 2 puffs into the lungs 4 times daily as needed for Wheezing, Disp: 18 g, Rfl: 0    metoprolol  tartrate (LOPRESSOR ) 50 MG tablet, Two hours before your CTA test check your heart rate, take by mouth 1 tablet if your heart rate is 55-70, 2 tablets if your heart rate is 71-80, 3 tablets if heart rate greater than 80, and no tablets if heart rate is less than 55., Disp: 3 tablet, Rfl: 0    Continuous Glucose Sensor (FREESTYLE LIBRE 3 PLUS SENSOR) MISC, Change sensor every 15 days, Disp: 6 each, Rfl: 1    insulin  lispro, 1 Unit Dial , (HUMALOG  KWIKPEN) 100 UNIT/ML SOPN, Take 6-8 units with breakfast and dinner. MAX TDD: 16 units (Patient not  taking: Reported on 03/13/2024), Disp: 5 Adjustable Dose Pre-filled Pen Syringe, Rfl: 3    Insulin  Pen Needle 32G X 6 MM MISC, Use one needle per injection -has 4 injections / day (Patient not taking: Reported on 03/13/2024), Disp: 400 each, Rfl: 3    Insulin  Pen Needle (PEN NEEDLES) 32G X 6 MM MISC, 1 each by Does not apply route 2 times daily (Patient not taking: Reported on 02/23/2024), Disp: 200 each, Rfl: 3    Allergies:  Sulfa antibiotics      REVIEW OF SYSTEMS         Review of Systems   Constitutional:  Negative for chills, fatigue, fever and unexpected weight  change.   HENT:  Negative for hearing loss, nosebleeds, sore throat, tinnitus and voice change.    Eyes:  Positive for visual disturbance.   Respiratory:  Positive for chest tightness and shortness of breath. Negative for apnea, cough and wheezing.    Cardiovascular:  Positive for chest pain and palpitations. Negative for leg swelling (claudication).   Gastrointestinal:  Positive for constipation and diarrhea. Negative for anal bleeding and blood in stool.   Endocrine: Positive for polydipsia.   Genitourinary:  Negative for difficulty urinating, dysuria, hematuria and urgency.   Musculoskeletal:  Negative for myalgias.   Skin:  Negative for rash (itchiness) and wound.   Neurological:  Positive for dizziness, weakness, light-headedness and numbness (tingling). Negative for syncope.   Hematological:  Does not bruise/bleed easily.   Psychiatric/Behavioral:  Negative for sleep disturbance.          PHYSICAL EXAM          BP (!) 170/90 (Patient Position: Sitting)   Pulse 70   Wt 101.2 kg (223 lb)   SpO2 97%   BMI 33.91 kg/m      Gen:  Alert and oriented 3 in no apparent distress  HEENT:  Normocephalic and atraumatic; clear oropharynx  Cardiovascular: Regular rate and rhythm, normal S1/2, no murmurs, no JVD  Lungs: Clear to auscultation bilaterally  Abdomen: Nontender nondistended  Extremities: No clubbing cyanosis or edema  Neuro:  No focal deficits;  ambulated into the office today without issue  Psych:  Appropriate mood and affect  Derm:  No new rashes or lesions      CARDIAC DIAGNOSTICS                 Encounter Date: 02/29/24   EKG 12 Lead (Chest Pain)   Result Value    Ventricular Rate 63    P-R Interval 160    QRS Duration 148    Q-T Interval 456    QTc Calculation (Bazett) 464    P Axis 61    R Axis -71    T Axis 72    Diagnosis      SINUS RHYTHM  ABNORMAL LEFT AXIS DEVIATION [QRS AXIS < -30]  LEFT BUNDLE BRANCH BLOCK [120+ MS QRS DURATION, 80+ MS Q/S IN V1/V2, 85+ MS R  IN I/AVL/V5/V6]  ABNORMAL ECG    Confirmed by Masindet-MD, Sarbabi ([id number]) on 03/01/2024 5:17:49 PM             Labs:    Lab Results   Component Value Date    WBC 5.8 02/29/2024    HGB 13.2 02/29/2024    HCT 38.5 02/29/2024    PLT 204 02/29/2024    CHOL 142 02/16/2024    TRIG 70 02/16/2024    HDL 44 02/16/2024    ALT 33 02/16/2024    AST 35 02/16/2024    NA 131 (L) 02/29/2024    K Hemolyzed (A) 02/29/2024    CL 99 02/29/2024    CREATININE 1.1 02/29/2024    BUN 25 (H) 02/29/2024    CO2 26 02/29/2024    TSH 1.390 02/29/2024    PSA 2.850 02/16/2024    LABA1C 7.3 (H) 02/16/2024      Lab Results   Component Value Date    CHOL 142 02/16/2024    TRIG 70 02/16/2024    HDL 44 02/16/2024    LDL 84.0 02/16/2024    VLDL 14.0 02/16/2024    CHOLHDLRATIO 3.2 02/16/2024  EKG was reviewed in the office today  Sinus rhythm with LBBB    ASSESSMENT           1. Precordial pain  -     Echo (TTE) complete (PRN contrast/bubble/strain/3D); Future  -     CBC; Future  -     Basic Metabolic Panel; Future  -     Case Request Cardiac Cath Lab; Future  2. Shortness of breath  -     Echo (TTE) complete (PRN contrast/bubble/strain/3D); Future  -     CBC; Future  -     Basic Metabolic Panel; Future  -     Case Request Cardiac Cath Lab; Future  3. Coronary artery disease involving native coronary artery of native heart with angina pectoris  -     Echo (TTE) complete (PRN contrast/bubble/strain/3D); Future  -     CBC;  Future  -     Basic Metabolic Panel; Future  -     Case Request Cardiac Cath Lab; Future  4. LBBB (left bundle branch block)  -     Echo (TTE) complete (PRN contrast/bubble/strain/3D); Future  -     CBC; Future  -     Basic Metabolic Panel; Future  -     Case Request Cardiac Cath Lab; Future  5. Type 2 diabetes mellitus without complication, without long-term current use of insulin  (HCC)  -     Echo (TTE) complete (PRN contrast/bubble/strain/3D); Future  -     CBC; Future  -     Basic Metabolic Panel; Future  -     Case Request Cardiac Cath Lab; Future  6. Pure hypercholesterolemia  -     Echo (TTE) complete (PRN contrast/bubble/strain/3D); Future  -     CBC; Future  -     Basic Metabolic Panel; Future  -     Case Request Cardiac Cath Lab; Future  7. Chest pain, unspecified type  -     Echo (TTE) complete (PRN contrast/bubble/strain/3D); Future       PLAN          1. Precordial pain  The patient is a very pleasant 54 year old gentleman with history of diabetes.  He recently was found to have a new left bundle branch block underwent an ER evaluation due to chest discomfort and exertional intolerance and the workup was relatively unremarkable other than the left bundle branch block he was discharged home and underwent a coronary CTA which did reveal moderate coronary disease both calcified and noncalcified plaques.  There did not appear to be severe stenosis albeit his symptoms continue to be concerning particularly the new onset of effort intolerance as well as the new EKG changes.  We discussed further options in terms of evaluation at this point time I think it be reasonable to proceed with diagnostic catheterization to see if there is a lesion that potentially can be intervened upon to help with his symptoms.  Also of concern however is a left bundle branch block which could result in a cardiomyopathy.  He has no overt findings of volume overload but will need to proceed with echocardiography to assess his LV  performance.    I discussed the situation and plan at length with the patient and his sister at the bedside and we will plan for echocardiography followed by diagnostic cardiac catheterization.    Refrain from strenuous work until heart catheterization is completed.    2. Shortness of breath  3. Coronary artery disease involving native coronary artery of native heart with angina pectoris      4. LBBB (left bundle branch block)      5. Type 2 diabetes mellitus without complication, without long-term current use of insulin  (HCC)      6. Pure hypercholesterolemia  Given the extensive coronary atherosclerosis noted on the coronary CTA we will go ahead and be much more aggressive in regards to his lipid management increase the atorvastatin  80 mg a day and start him on ezetimibe  10 mg a day with a goal of an LDL of 50.  Ultimately we may need to consider injectable medications as well.    7. Chest pain, unspecified type      The patient (or guardian, if applicable) and other individuals in attendance with the patient were advised that Artificial Intelligence will be utilized during this visit to record, process the conversation to generate a clinical note, and support improvement of the AI technology. The patient (or guardian, if applicable) and other individuals in attendance at the appointment consented to the use of AI, including the recording.                   Patient had no questions regarding treatment plan, goals, risks or benefits and agrees to contact me or my clinic in the interim should any questions or problems arise.     Thank you for allowing me to participate in the care of your patient.      Reyes RAMAN. Rojean, MD, FACC

## 2024-03-13 NOTE — Patient Instructions (Addendum)
 Suggest staying active with daily activity.   Continue current medications.  The medication list included in this document is our record of what you are currently taking, including any changes that were made at today's visit.  If you find any differences when compared to your medications at home, or have any questions that were not answered at your visit, please contact the office.   Increase Atorvastatin  to 80mg  daily  Start Ezetimibe  10mg  daily      Schedule echocardiogram.   Schedule left heart cath    Please do not return to work until after the heart cath has been completed

## 2024-03-14 ENCOUNTER — Inpatient Hospital Stay: Admit: 2024-03-14 | Payer: PRIVATE HEALTH INSURANCE | Attending: Interventional Cardiology | Primary: Family Medicine

## 2024-03-14 ENCOUNTER — Encounter

## 2024-03-14 VITALS — BP 133/74 | Ht 68.0 in | Wt 223.0 lb

## 2024-03-14 DIAGNOSIS — R072 Precordial pain: Principal | ICD-10-CM

## 2024-03-14 LAB — ECHO (TTE) COMPLETE (PRN CONTRAST/BUBBLE/STRAIN/3D)
Ao Root Index: 1.5 cm/m2
Aortic Root: 3.2 cm
Ascending Aorta Index: 1.36 cm/m2
Ascending Aorta: 2.9 cm
Body Surface Area: 2.2 m2
E/E' Lateral: 8.74
E/E' Ratio (Averaged): 9.65
E/E' Septal: 10.56
EF Physician: 56 %
Est. RA Pressure: 3 mmHg
IVC Expiration: 2 cm
IVSd: 1.3 cm — AB (ref 0.6–1.0)
LA Area 4C: 18.6 cm2
LA Diameter: 4.4 cm
LA Major Axis: 5.2 cm
LA Size Index: 2.06 cm/m2
LA Volume Index MOD A4C: 25 mL/m2 (ref 16–34)
LA Volume MOD A4C: 54 mL (ref 18–58)
LA/AO Root Ratio: 1.38
LV E' Lateral Velocity: 8.81 cm/s
LV E' Septal Velocity: 7.29 cm/s
LV EDV A4C: 134 mL
LV EDV Index A4C: 63 mL/m2
LV ESV A4C: 59 mL
LV ESV Index A4C: 28 mL/m2
LV Ejection Fraction A4C: 56 %
LV Mass 2D Index: 130.1 g/m2 — AB (ref 49–115)
LV Mass 2D: 278.4 g — AB (ref 88–224)
LV RWT Ratio: 0.5
LVIDd Index: 2.43 cm/m2
LVIDd: 5.2 cm (ref 4.2–5.9)
LVOT Area: 3.5 cm2
LVOT Diameter: 2.1 cm
LVOT Mean Gradient: 2 mmHg
LVOT Peak Gradient: 4 mmHg
LVOT Peak Velocity: 1 m/s
LVOT SV: 68 mL
LVOT Stroke Volume Index: 31.7 mL/m2
LVOT VTI: 19.6 cm
LVPWd: 1.3 cm — AB (ref 0.6–1.0)
MV A Velocity: 0.89 m/s
MV E Velocity: 0.77 m/s
MV E Wave Deceleration Time: 204 ms
MV E/A: 0.87
RV Free Wall Peak S': 15 cm/s
RVIDd: 3 cm
TAPSE: 2.6 cm (ref 1.7–?)

## 2024-03-14 MED ORDER — EZETIMIBE 10 MG PO TABS
10 | ORAL_TABLET | Freq: Every day | ORAL | 3 refills | 90.00000 days | Status: AC
Start: 2024-03-14 — End: ?

## 2024-03-14 NOTE — Telephone Encounter (Signed)
 Pt has refills at the pharmacy they will fill it.    Called the pt and let him know.

## 2024-03-14 NOTE — Telephone Encounter (Signed)
 Zetia  10mg  daily ordered and sent to pt's preferred pharmacy per OV 03/13/24.

## 2024-03-15 ENCOUNTER — Encounter

## 2024-03-15 ENCOUNTER — Other Ambulatory Visit: Admit: 2024-03-15 | Discharge: 2024-03-15 | Payer: PRIVATE HEALTH INSURANCE | Primary: Family Medicine

## 2024-03-15 ENCOUNTER — Telehealth

## 2024-03-15 MED ORDER — ATORVASTATIN CALCIUM 80 MG PO TABS
80 | ORAL_TABLET | Freq: Every day | ORAL | 3 refills | 90.00000 days | Status: AC
Start: 2024-03-15 — End: 2025-03-10

## 2024-03-15 NOTE — Telephone Encounter (Signed)
 LVM for patient that echo looked good.  Psychologist, [employer]

## 2024-03-15 NOTE — Telephone Encounter (Signed)
 Medication verified per last visit on 03/13/24.  Patient does not have follow-up appt scheduled.   Refill sent to pharmacy.

## 2024-03-15 NOTE — Telephone Encounter (Signed)
 Spoke to the patient and the heart cath has been scheduled for 03/20/24 with JSR.

## 2024-03-15 NOTE — Progress Notes (Signed)
"  Venipuncture-[id number]:  Verbal consent obtained; patient tolerated procedure well; site cleansed with aseptic technique; pressure applied.  Location:[facility]   Technique:21 G N        "

## 2024-03-16 LAB — BASIC METABOLIC PANEL
Anion Gap: 6 mmol/L (ref 2–17)
BUN: 24 mg/dL — ABNORMAL HIGH (ref 6–20)
CO2: 32 mmol/L — ABNORMAL HIGH (ref 22–29)
Calcium: 9 mg/dL (ref 8.5–10.7)
Chloride: 99 mmol/L (ref 98–107)
Creatinine: 1 mg/dL (ref 0.7–1.3)
Est, Glom Filt Rate: 89 mL/min/1.73m?? (ref >=60)
Glucose: 147 mg/dL — ABNORMAL HIGH (ref 70–99)
Osmolaliy Calculated: 280 mosm/kg (ref 270–287)
Potassium: 4.3 mmol/L (ref 3.5–5.3)
Sodium: 137 mmol/L (ref 135–145)

## 2024-03-16 LAB — CBC
Hematocrit: 40.3 % (ref 38.0–52.0)
Hemoglobin: 13.6 g/dL (ref 13.0–17.3)
MCH: 27.9 pg (ref 27.0–34.5)
MCHC: 33.7 g/dL (ref 30.0–36.0)
MCV: 82.8 fL — ABNORMAL LOW (ref 84.0–100.0)
MPV: 10.2 fL (ref 7.0–12.2)
NRBC Absolute: 0 x10e3/mcL (ref 0.00–0.01)
NRBC Automated: 0 % (ref 0.0–0.2)
Platelets: 263 x10e3/mcL (ref 140–440)
RBC: 4.87 x10e6/mcL (ref 4.00–5.60)
RDW: 13.4 % (ref 10.0–17.0)
WBC: 7.1 x10e3/mcL (ref 3.8–10.6)

## 2024-03-20 NOTE — Telephone Encounter (Signed)
Addressed in results tab.

## 2024-03-21 ENCOUNTER — Inpatient Hospital Stay: Payer: PRIVATE HEALTH INSURANCE | Attending: Interventional Cardiology

## 2024-03-21 LAB — CARDIAC PROCEDURE: Body Surface Area: 2.19 m2

## 2024-03-21 LAB — POCT GLUCOSE: POC Glucose: 272 mg/dL — ABNORMAL HIGH (ref 70.0–120.0)

## 2024-03-21 MED ORDER — VERAPAMIL HCL 2.5 MG/ML IV SOLN
2.5 | INTRAVENOUS | Status: AC
Start: 2024-03-21 — End: 2024-03-21

## 2024-03-21 MED ORDER — FENTANYL CITRATE (PF) 100 MCG/2ML IJ SOLN
100 | INTRAMUSCULAR | Status: AC
Start: 2024-03-21 — End: 2024-03-21

## 2024-03-21 MED ORDER — FENTANYL CITRATE (PF) 100 MCG/2ML IJ SOLN
100 | INTRAMUSCULAR | Status: DC | PRN
Start: 2024-03-21 — End: 2024-03-21
  Administered 2024-03-21 (×2): 25 via INTRAVENOUS

## 2024-03-21 MED ORDER — NORMAL SALINE FLUSH 0.9 % IV SOLN
0.9 | Freq: Once | INTRAVENOUS | Status: DC
Start: 2024-03-21 — End: 2024-03-21

## 2024-03-21 MED ORDER — SODIUM CHLORIDE 0.9 % IV SOLN
0.9 | Freq: Once | INTRAVENOUS | Status: DC | PRN
Start: 2024-03-21 — End: 2024-03-21

## 2024-03-21 MED ORDER — MIDAZOLAM HCL 5 MG/5ML IJ SOLN
5 | INTRAMUSCULAR | Status: DC | PRN
Start: 2024-03-21 — End: 2024-03-21
  Administered 2024-03-21 (×2): 1 via INTRAVENOUS

## 2024-03-21 MED ORDER — IOPAMIDOL 76 % IV SOLN
76 | INTRAVENOUS | Status: DC | PRN
Start: 2024-03-21 — End: 2024-03-21
  Administered 2024-03-21: 20:00:00 50 via INTRACARDIAC

## 2024-03-21 MED ORDER — SODIUM CHLORIDE 0.9 % IV SOLN
0.9 | INTRAVENOUS | Status: DC
Start: 2024-03-21 — End: 2024-03-21

## 2024-03-21 MED ORDER — MIDAZOLAM HCL 5 MG/5ML IJ SOLN
5 | INTRAMUSCULAR | Status: AC
Start: 2024-03-21 — End: 2024-03-21

## 2024-03-21 MED ORDER — VERAPAMIL HCL 2.5 MG/ML IV SOLN
2.5 | INTRAVENOUS | Status: DC | PRN
Start: 2024-03-21 — End: 2024-03-21
  Administered 2024-03-21: 20:00:00 3 via INTRA_ARTERIAL

## 2024-03-21 MED ORDER — HEPARIN SODIUM (PORCINE) 1000 UNIT/ML IJ SOLN
1000 | INTRAMUSCULAR | Status: AC
Start: 2024-03-21 — End: 2024-03-21

## 2024-03-21 MED ORDER — HEPARIN SODIUM (PORCINE) 1000 UNIT/ML IJ SOLN
1000 | INTRAMUSCULAR | Status: DC | PRN
Start: 2024-03-21 — End: 2024-03-21
  Administered 2024-03-21: 20:00:00 5000 via INTRAVENOUS

## 2024-03-21 MED ORDER — ACETAMINOPHEN 325 MG PO TABS
325 | ORAL | Status: DC | PRN
Start: 2024-03-21 — End: 2024-03-21

## 2024-03-21 MED ORDER — LIDOCAINE HCL 1 % IJ SOLN
1 | INTRAMUSCULAR | Status: DC | PRN
Start: 2024-03-21 — End: 2024-03-21
  Administered 2024-03-21: 20:00:00 5 via INTRADERMAL

## 2024-03-21 MED ORDER — LIDOCAINE HCL 1 % IJ SOLN
1 | INTRAMUSCULAR | Status: AC
Start: 2024-03-21 — End: 2024-03-21

## 2024-03-21 MED ORDER — SODIUM CHLORIDE 0.9 % IV BOLUS
0.9 | Freq: Once | INTRAVENOUS | Status: AC
Start: 2024-03-21 — End: 2024-03-21
  Administered 2024-03-21: 18:00:00 3 mL/kg/h via INTRAVENOUS

## 2024-03-21 MED FILL — FENTANYL CITRATE (PF) 100 MCG/2ML IJ SOLN: 100 MCG/2ML | INTRAMUSCULAR | Qty: 2

## 2024-03-21 MED FILL — MIDAZOLAM HCL 5 MG/5ML IJ SOLN: 5 mg/mL | INTRAMUSCULAR | Qty: 5

## 2024-03-21 MED FILL — HEPARIN SODIUM (PORCINE) 1000 UNIT/ML IJ SOLN: 1000 [IU]/mL | INTRAMUSCULAR | Qty: 10

## 2024-03-21 MED FILL — XYLOCAINE 1 % IJ SOLN: 1 % | INTRAMUSCULAR | Qty: 20

## 2024-03-21 MED FILL — VERAPAMIL HCL 2.5 MG/ML IV SOLN: 2.5 mg/mL | INTRAVENOUS | Qty: 2

## 2024-03-21 NOTE — Discharge Summary (Signed)
 Discharge Summary    Date: 03/21/2024  Patient Name: Harry Mitchell    Date of Birth: December 06, 1970     Age: 54 y.o.    Admit Date: 03/21/2024  Discharge Date: 03/21/2024  Discharge Condition: Good    Admission Diagnosis  Coronary artery disease [I25.10]      Discharge Diagnosis  Principal Problem:    Coronary artery disease  Resolved Problems:    * No resolved hospital problems. [facility] Stay  Narrative of Hospital Course:  Patient was mated for diagnostic cardiac catheterization.  He was brought to the Cath Lab where he was found to have diffuse mild coronary artery disease.  He was found to have a prominent first diagonal vessel that had approximately 50% stenosis.  This stenosis was reviewed by coronary CTA and found to have a borderline FFR.  It was therefore felt that would continue medical therapy.  There were no complication with the procedure is good to be discharged home later today.    Consultants:  None    Surgeries/procedures Performed:      Treatments:            Discharge Plan/Disposition:  Home    Hospital/Incidental Findings Requiring Follow Up:    Patient Instructions:    Diet:    Activity:  For number of days (if applicable):      Other Instructions:    Provider Follow-Up:   No follow-ups on file.     Significant Diagnostic Studies:    Recent Labs:  Admission on 03/21/2024  POC Glucose                                   Date: 03/21/2024  Value: 272.0 (H)   Ref range: 70.0 - 120.0 mg/*  Status: Final                Comment: [facility] - Cath Lab  ------------    Radiology last 7 days:  No results found.     @DCPENDLAB @    Discharge Medications    Current Discharge Medication List        Current Discharge Medication List        Current Discharge Medication List    CONTINUE these medications which have NOT CHANGED    aspirin  81 MG chewable tablet  Take 1 tablet by mouth daily    atorvastatin  (LIPITOR) 80 MG tablet  Take 1 tablet by mouth daily  Qty: 90 tablet Refills: 3  Associated Diagnoses:Mixed  hyperlipidemia    ezetimibe  (ZETIA ) 10 MG tablet  Take 1 tablet by mouth daily  Qty: 90 tablet Refills: 3    amLODIPine  (NORVASC ) 5 MG tablet  Take 1 tablet by mouth daily  Qty: 30 tablet Refills: 0    gabapentin  (NEURONTIN ) 300 MG capsule  Take 1 capsule by mouth 2 times daily for 180 days.  Qty: 60 capsule Refills: 5  Associated Diagnoses:Lumbar pain with radiation down left leg    hydroCHLOROthiazide  (HYDRODIURIL ) 25 MG tablet  Take 1 tablet by mouth daily  Qty: 90 tablet Refills: 3  Associated Diagnoses:Essential hypertension, benign    losartan  (COZAAR ) 100 MG tablet  Take 1 tablet by mouth daily  Qty: 90 tablet Refills: 3  Associated Diagnoses:Essential hypertension, benign    sildenafil  (REVATIO ) 20 MG tablet  Take 1 tablet by mouth daily as needed (ED; take 30 min prior to sexual activity)  Qty: 10 tablet Refills: 5  Associated Diagnoses:Erectile dysfunction, unspecified erectile dysfunction type    Continuous Glucose Sensor (FREESTYLE LIBRE 3 PLUS SENSOR) MISC  Change sensor every 15 days  Qty: 6 each Refills: 1  Associated Diagnoses:Type 2 diabetes mellitus with hyperglycemia, with long-term current use of insulin  (HCC)    insulin  lispro, 1 Unit Dial , (HUMALOG  KWIKPEN) 100 UNIT/ML SOPN  Take 6-8 units with breakfast and dinner. MAX TDD: 16 units  Qty: 5 Adjustable Dose Pre-filled Pen Syringe Refills: 3  Associated Diagnoses:Type 2 diabetes mellitus with hyperglycemia, with long-term current use of insulin  (HCC)    insulin  lispro (HUMALOG ,ADMELOG ) 100 UNIT/ML SOLN injection vial  Max TDD 90 units for continuous SQ infusion with insulin  pump  Qty: 90 mL Refills: 1  Associated Diagnoses:LADA (latent autoimmune diabetes in adults), managed as type 1 (HCC)    ferrous sulfate  (IRON 325) 325 (65 Fe) MG tablet  Take 1 tablet by mouth daily (with breakfast)  Qty: 90 tablet Refills: 1  Associated Diagnoses:Iron deficiency anemia, unspecified iron deficiency anemia type    FLUoxetine  (PROZAC ) 40 MG capsule  Take 1  capsule by mouth daily  Qty: 90 capsule Refills: 1  Associated Diagnoses:Anxiety    !! Insulin  Pen Needle 32G X 6 MM MISC  Use one needle per injection -has 4 injections / day  Qty: 400 each Refills: 3  Associated Diagnoses:Type 2 diabetes mellitus with hyperglycemia, with long-term current use of insulin  (HCC)    !! Insulin  Pen Needle (PEN NEEDLES) 32G X 6 MM MISC  1 each by Does not apply route 2 times daily  Qty: 200 each Refills: 3  Associated Diagnoses:Type 2 diabetes mellitus with hyperglycemia, with long-term current use of insulin  (HCC)    insulin  glargine (LANTUS  SOLOSTAR) 100 UNIT/ML injection pen  Inject 45-50 Units into the skin daily  Qty: 5 mL Refills: 1  Comments: 12/06/2022 12:17:16 PM  Associated Diagnoses:Type 2 diabetes mellitus with complication (HCC)    albuterol  sulfate HFA (VENTOLIN  HFA) 108 (90 Base) MCG/ACT inhaler  Inhale 2 puffs into the lungs 4 times daily as needed for Wheezing  Qty: 18 g Refills: 0  Associated Diagnoses:Acute cough    !! - Potential duplicate medications found. Please discuss with provider.          Current Discharge Medication List    STOP taking these medications    ibuprofen  (ADVIL ;MOTRIN ) 800 MG tablet  Comments:  Reason for Stopping:    metoprolol  tartrate (LOPRESSOR ) 50 MG tablet  Comments:  Reason for Stopping:          Time Spent on Discharge:  minutes were spent in patient examination, evaluation, counseling as well as medication reconciliation, prescriptions for required medications, discharge plan, and follow up.    Electronically signed by Reyes Jackquline Bang, MD on 03/21/24 at 3:26 PM EST

## 2024-03-21 NOTE — Interval H&P Note (Signed)
 Update History & Physical    The patient's History and Physical of March 13, 2024 was reviewed with the patient and I examined the patient. There was no change. The surgical site was confirmed by the patient and me.     Plan: The risks, benefits, expected outcome, and alternative to the recommended procedure have been discussed with the patient. Patient understands and wants to proceed with the procedure.     I have discussed cardiac catheterization and potential PCI with the patient and family.  The indications for the procedure were discussed in depth.  The risks of the procedure were also explained.  The risks were explained to include but not be limited to myocardial infarction, CVA, bleeding complications, allergic reaction contrast media, kidney injury.  We have also discussed the potential for intervention and its risks of death, myocardial infarction and emergency open heart surgery.  The patient understands all these risks and has been given ample time to answer any further questions.  The patient would like to proceed with the planned procedure.     Electronically signed by Harry Jackquline Bang, MD on 03/21/2024 at 12:59 PM

## 2024-03-21 NOTE — Pre Sedation (Signed)
"  Sedation Plan  ASA: class 3 - patient with severe systemic disease     Mallampati class: III - soft palate, base of uvula visible.    Sedation plan: local anesthesia and moderate (conscious sedation)    Risks, benefits, and alternatives discussed with patient.        Immediate reassessment prior to sedation:  Patient's status reviewed and vital signs assessed; acceptable to perform procedure and proceed to administer sedation as planned.    "

## 2024-03-21 NOTE — Discharge Instructions (Signed)
"  [name]   Cardiac Cath Lab Discharge Instructions  Radial Catheterization Precautions    RADIAL CATHETERIZATION AND INTERVENTIONAL DISCHARGE INSTRUCTIONS      MEDICATIONS:  Take only medications ordered by your provider.     ACTIVITIES:  While the wound is healing; bleeding or swelling can occur as a result of stress or strain.  Carefully follow these guidelines to prevent any adverse event:    DO NOT DRIVE for 24 hours after the procedure or as instructed by your provider.  You may shower 24 hours after your procedure. No soaking the wrist for 3 days (i.e., bath tub, swimming, washing dishes).  You may return to work in (4) days.  It is essential that you DO NOT SMOKE.  Do not bend your wrist for 24 hours.  Do not lift more than 3-5 pounds with affected wrist for 1 week.     SITE CARE:  Use a clean Band-Aid for 48 hours.  Keep site clean and dry.  Avoid lotions, ointments or powders at the wound site for I week.  Check the procedural site for any signs of infection (redness, drainage, swelling).  You may notice a small, hard lump or small bruise at the puncture site. This is normal and will clear in about 2 weeks.  If the lump increases in size; apply firm, direct pressure over the site. Notify your physician.  If there is a small amount of bleeding at the site; apply firm, direct continuous pressure over the site with your thumb against the puncture site and your finger against the back of the wrist for 10 minutes. Your nurse will review this with you. Notify your physician.  If bleeding does not stop after 10 minutes or if there is a large amount of bleeding, call for an ambulance immediately. Continue to hold pressure until help arrives.     WHEN TO CALL YOUR DOCTOR:  Angina - if your angina symptoms return; use your nitroglycerin as instructed by your provider. If you do not have nitroglycerin or if your symptoms do not completely resolve after 10 -15 minutes, call an ambulance. Do not  drive yourself to the hospital.  Warmth, redness, drainage and/or pain at the procedural site or temperature over 101F.  Persistent tenderness or pain at procedural site.  Swelling, coolness, numbness and /or tingling of the fingers, hand, wrist or arm.  Lightheadedness, dizziness, fainting or rapid heart beating.  If you have any other questions or concerns, or you are experiencing a problem.     FOLLOW-UP CARE:  Please make a follow up appointment with Dr. Rojean of [facility] after discharge. Prepare for appointment in 4-6 weeks after leaving for good tonight   "

## 2024-03-22 NOTE — Telephone Encounter (Signed)
"  Called pt to inform of appt   "

## 2024-03-27 ENCOUNTER — Encounter: Attending: Orthopaedic Surgery | Primary: Family Medicine

## 2024-03-27 NOTE — Telephone Encounter (Signed)
-----   Message from [facility] D, Strang sent at 03/22/2024  1:50 PM EST -----    ----- Message -----  From: Rojean Reyes Rattler, MD  Sent: 03/21/2024   3:28 PM EST  To: Janiece GORMAN Ford, MA    Did a heart cath on this patient please make sure his follow-up with me in about a month or so.  Thanks  JR

## 2024-03-29 ENCOUNTER — Encounter: Primary: Family Medicine

## 2024-03-30 NOTE — Telephone Encounter (Signed)
 Patient was advised of appt

## 2024-04-11 ENCOUNTER — Ambulatory Visit
Admit: 2024-04-11 | Discharge: 2024-04-11 | Payer: PRIVATE HEALTH INSURANCE | Attending: Interventional Cardiology | Primary: Family Medicine

## 2024-04-11 ENCOUNTER — Encounter: Payer: PRIVATE HEALTH INSURANCE | Attending: Physician Assistant | Primary: Family Medicine

## 2024-04-11 DIAGNOSIS — I10 Essential (primary) hypertension: Principal | ICD-10-CM

## 2024-04-11 MED ORDER — AMLODIPINE BESYLATE 5 MG PO TABS
5 | ORAL_TABLET | Freq: Every day | ORAL | 3 refills | Status: AC
Start: 2024-04-11 — End: 2025-04-11

## 2024-04-11 NOTE — Telephone Encounter (Signed)
 Left the patient a message to contact the office to reschedule appointment. Please reschedule the patient with Rocky Opal next available.

## 2024-04-11 NOTE — Progress Notes (Signed)
 Date:  April 11, 2024  Patient name: Harry Mitchell  Date of Birth: Feb 10, 1971    CARDIOLOGY CLINIC OFFICE NOTE      HISTORY OF PRESENT ILLNESS          Dishon Kehoe is a 54 y.o. male who presents to cardiology clinic follow up visit.    This is first visit with him since he had a heart cath.  He is overall feeling well without dyspnea or chest discomfort.    PAST MEDICAL, SOCIAL AND FAMILY HISTORY          Past Medical History:   has a past medical history of Hyperlipidemia, Hypertension, and Type 2 diabetes mellitus without complication.    Past Surgical History:   has a past surgical history that includes Anterior cruciate ligament repair (Right); Finger amputation (Left); Pain management procedure (N/A, 06/09/2023); Pain management procedure (N/A, 06/23/2023); Pain management procedure (N/A, 01/11/2024); Cardiac catheterization (03/21/2024); and Cardiac procedure (N/A, 03/21/2024).     Social History:   reports that he quit smoking about 2 years ago. His smoking use included cigarettes. He started smoking about 17 years ago. He has a 14.3 pack-year smoking history. He has never used smokeless tobacco. He reports that he does not currently use alcohol. He reports that he does not currently use drugs after having used the following drugs: Methamphetamines (Crystal Meth).     Family History: family history includes Colon Cancer in his maternal grandmother; Diabetes in his brother, paternal grandfather, and paternal uncle.    Problem List:  Patient Active Problem List   Diagnosis    Essential hypertension, benign    History of drug abuse in remission (HCC)    Hyperlipemia    Anxiety    Depression    Acute hepatitis A    Erectile dysfunction    Hypertension    Diabetes mellitus (HCC)    Hypoglycemia due to insulin     Insomnia    Lumbar pain with  radiation down left leg    Abscess    Obesity, unspecified    Osteomyelitis (HCC)    Osteomyelitis of finger of left hand (HCC)    Pneumonia of both lungs due to infectious organism    Pulp abscess of finger of left hand    Septic arthritis (HCC)    Trigger thumb of left hand    Lumbar disc herniation    LADA (latent autoimmune diabetes in adults), managed as type 1 (HCC)    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)    Otitis externa    Acute non-recurrent maxillary sinusitis    Lumbar radiculopathy    Coronary artery disease          MEDICATIONS AND ALLERGIES           Current Outpatient Medications:     aspirin  81 MG chewable tablet, Take 1 tablet by mouth daily, Disp: , Rfl:     atorvastatin  (LIPITOR) 80 MG tablet, Take 1 tablet by mouth daily, Disp: 90 tablet, Rfl: 3    ezetimibe  (ZETIA ) 10 MG tablet, Take 1 tablet by mouth daily, Disp: 90 tablet, Rfl: 3    gabapentin  (NEURONTIN ) 300 MG capsule, Take 1 capsule by mouth 2 times daily for 180 days., Disp: 60 capsule, Rfl: 5    hydroCHLOROthiazide  (HYDRODIURIL ) 25 MG tablet, Take 1 tablet by mouth daily, Disp: 90 tablet, Rfl: 3    losartan  (COZAAR ) 100 MG tablet, Take 1 tablet by mouth daily, Disp: 90 tablet, Rfl: 3  sildenafil  (REVATIO ) 20 MG tablet, Take 1 tablet by mouth daily as needed (ED; take 30 min prior to sexual activity), Disp: 10 tablet, Rfl: 5    insulin  lispro, 1 Unit Dial , (HUMALOG  KWIKPEN) 100 UNIT/ML SOPN, Take 6-8 units with breakfast and dinner. MAX TDD: 16 units, Disp: 5 Adjustable Dose Pre-filled Pen Syringe, Rfl: 3    insulin  lispro (HUMALOG ,ADMELOG ) 100 UNIT/ML SOLN injection vial, Max TDD 90 units for continuous SQ infusion with insulin  pump, Disp: 90 mL, Rfl: 1    ferrous sulfate  (IRON 325) 325 (65 Fe) MG tablet, Take 1 tablet by mouth daily (with breakfast), Disp: 90 tablet, Rfl: 1    FLUoxetine  (PROZAC ) 40 MG capsule, Take 1 capsule by mouth daily, Disp: 90 capsule, Rfl: 1    insulin  glargine (LANTUS  SOLOSTAR)  100 UNIT/ML injection pen, Inject 45-50 Units into the skin daily, Disp: 5 mL, Rfl: 1    albuterol  sulfate HFA (VENTOLIN  HFA) 108 (90 Base) MCG/ACT inhaler, Inhale 2 puffs into the lungs 4 times daily as needed for Wheezing, Disp: 18 g, Rfl: 0    amLODIPine  (NORVASC ) 5 MG tablet, Take 1 tablet by mouth daily (Patient not taking: Reported on 04/11/2024), Disp: 30 tablet, Rfl: 0    Continuous Glucose Sensor (FREESTYLE LIBRE 3 PLUS SENSOR) MISC, Change sensor every 15 days, Disp: 6 each, Rfl: 1    Insulin  Pen Needle 32G X 6 MM MISC, Use one needle per injection -has 4 injections / day, Disp: 400 each, Rfl: 3    Insulin  Pen Needle (PEN NEEDLES) 32G X 6 MM MISC, 1 each by Does not apply route 2 times daily, Disp: 200 each, Rfl: 3    Allergies:  Sulfa antibiotics      REVIEW OF SYSTEMS         Review of Systems   Constitutional:  Negative for chills, fatigue, fever and unexpected weight change.   HENT:  Negative for hearing loss, nosebleeds, sore throat, tinnitus and voice change.    Eyes:  Negative for visual disturbance.   Respiratory:  Negative for apnea, cough, chest tightness, shortness of breath and wheezing.    Cardiovascular:  Negative for chest pain, palpitations and leg swelling.   Gastrointestinal:  Negative for anal bleeding, blood in stool, constipation and diarrhea.   Endocrine: Negative for polydipsia.   Genitourinary:  Negative for difficulty urinating, dysuria, hematuria and urgency.   Musculoskeletal:  Negative for myalgias.   Skin:  Negative for rash and wound.   Neurological:  Negative for dizziness, syncope, weakness, light-headedness and numbness.   Hematological:  Does not bruise/bleed easily.   Psychiatric/Behavioral:  Negative for sleep disturbance.          PHYSICAL EXAM          BP (!) 165/100 (Patient Position: Sitting)   Pulse 72   Wt 105.2 kg (232 lb)   SpO2 99%   BMI 35.28 kg/m      Gen:  Alert and oriented 3 in no apparent distress  HEENT:  Normocephalic and atraumatic; clear  oropharynx  Cardiovascular: Regular rate and rhythm, normal S1/2, no murmurs, no JVD  Lungs: Clear to auscultation bilaterally  Abdomen: Nontender nondistended  Extremities: No clubbing cyanosis or edema  Neuro:  No focal deficits; ambulated into the office today without issue  Psych:  Appropriate mood and affect  Derm:  No new rashes or lesions      CARDIAC DIAGNOSTICS          03/14/24  ECHO (TTE) COMPLETE (PRN CONTRAST/BUBBLE/STRAIN/3D) 03/14/2024  6:22 PM (Final)    Interpretation Summary    Left Ventricle: Normal left ventricular systolic function with a visually estimated EF of 55 - 60%. Left ventricle size is normal. Mildly increased wall thickness. Findings consistent with mild concentric hypertrophy. Normal wall motion. Grade I diastolic dysfunction with normal LAP.    Right Ventricle: Right ventricle size is normal. Normal systolic function.    Left Atrium: Left atrium is mildly dilated.    Image quality is adequate.    Signed by: Reyes Jackquline Bang, MD on 03/14/2024  6:22 PM         03/21/24    CARDIAC PROCEDURE 03/21/2024  3:33 PM (Final)    Conclusion    Diffuse mild coronary artery disease as outlined above.    Moderate stenosis of a prominent first diagonal vessel    Mildly elevated left ventricular end-diastolic pressure    Signed by: Reyes Jackquline Bang, MD on 03/21/2024  3:33 PM       Encounter Date: 02/29/24   EKG 12 Lead (Chest Pain)   Result Value    Ventricular Rate 63    P-R Interval 160    QRS Duration 148    Q-T Interval 456    QTc Calculation (Bazett) 464    P Axis 61    R Axis -71    T Axis 72    Diagnosis      SINUS RHYTHM  ABNORMAL LEFT AXIS DEVIATION [QRS AXIS < -30]  LEFT BUNDLE BRANCH BLOCK [120+ MS QRS DURATION, 80+ MS Q/S IN V1/V2, 85+ MS R  IN I/AVL/V5/V6]  ABNORMAL ECG    Confirmed by Masindet-MD, Sarbabi ([id number]) on 03/01/2024 5:17:49 PM             Labs:    Lab Results   Component Value Date    WBC 7.1 03/15/2024    HGB 13.6 03/15/2024    HCT 40.3 03/15/2024    PLT 263  03/15/2024    CHOL 142 02/16/2024    TRIG 70 02/16/2024    HDL 44 02/16/2024    ALT 33 02/16/2024    AST 35 02/16/2024    NA 137 03/15/2024    K 4.3 03/15/2024    CL 99 03/15/2024    CREATININE 1.0 03/15/2024    BUN 24 (H) 03/15/2024    CO2 32 (H) 03/15/2024    TSH 1.390 02/29/2024    PSA 2.850 02/16/2024    LABA1C 7.3 (H) 02/16/2024      Lab Results   Component Value Date    CHOL 142 02/16/2024    TRIG 70 02/16/2024    HDL 44 02/16/2024    LDL 84.0 02/16/2024    VLDL 14.0 02/16/2024    CHOLHDLRATIO 3.2 02/16/2024                  ASSESSMENT           1. Primary hypertension  The following orders have not been finalized:  -     amLODIPine  (NORVASC ) 5 MG tablet  2. Coronary artery disease involving native coronary artery of native heart without angina pectoris  3. Pure hypercholesterolemia       PLAN          1. Coronary artery disease involving native coronary artery of native heart without angina pectoris  Recent heart cath with moderate coronary disease.  He is on high dose atorvastatin .  He is asymptomatic    2.  Primary hypertension  BP is poorly controlled, but he ran out of Amlodipine .  I have resent the prescription.    3. Pure hypercholesterolemia  On high dose atorvastatin .        5. Type 2 diabetes mellitus without complication, without long-term current use of insulin  [facility])           Patient had no questions regarding treatment plan, goals, risks or benefits and agrees to contact me or my clinic in the interim should any questions or problems arise.     Thank you for allowing me to participate in the care of your patient.      Reyes RAMAN. Rojean, MD, FACC

## 2024-04-11 NOTE — Patient Instructions (Addendum)
"  Suggest staying active with daily activity.   Continue current medications.  The medication list included in this document is our record of what you are currently taking, including any changes that were made at today's visit.  If you find any differences when compared to your medications at home, or have any questions that were not answered at your visit, please contact the office.     "

## 2024-04-12 ENCOUNTER — Telehealth
Admit: 2024-04-12 | Discharge: 2024-04-12 | Payer: PRIVATE HEALTH INSURANCE | Attending: Physician Assistant | Primary: Family Medicine

## 2024-04-12 DIAGNOSIS — E1121 Type 2 diabetes mellitus with diabetic nephropathy: Principal | ICD-10-CM

## 2024-04-12 NOTE — Progress Notes (Signed)
 04/12/2024    TELEHEALTH EVALUATION -- Audio/Visual (During COVID-19 public health emergency)    History of Present Illness  The patient is a 53 year old male who presents for a virtual visit follow-up for type 1 diabetes. He is on a [employer] iLet insulin  pump, which was downloaded and reviewed today.    He reports satisfactory management of his condition with the insulin  pump, noting a decrease in hypoglycemic episodes compared to previous experiences. Efforts have been made to avoid manual corrections, allowing the pump to regulate insulin  levels. Blood sugar levels tend to decrease around mealtimes, indicating the pump's effectiveness. Dietary modifications, such as eliminating creamer from coffee, have been made to better manage blood sugar levels, although there have been occasional indulgences in desserts over the past month. Instances of the pump's battery depleting have necessitated extended periods without its use. To mitigate this, a practice of preparing a new vial and restarting the process when anticipating the need for an overnight supply of insulin  was implemented within the past week.    iLet download:  TDD: 95.3 units, Total daily basal: 40.8 units  Usual BF: 12.1  Usual L: 13.4  Usual D: 13.2  Meal announcements/day: 2.6  I have downloaded and reviewed patient's Libre 3+ CGM. Continuous glucose monitoring is from date 1/23-04/12/24  Looking at the trend starting at midnight within goal range, with variability high and low  Postprandial level are well controlled  Hypoglycemic episodes frequent  Average glucose is 179 with 54.9 % time in range and 3 % hypoglycemia.  GMI 7.6%        A few weeks ago, he underwent a heart catheterization due to chest pain. The procedure revealed some blockage in a diagonal artery, but no stent was required. He acknowledges the importance of maintaining optimal blood sugar levels to prevent further complications.    Diet: Eliminated creamer from coffee; occasional  indulgence in desserts  Coffee/Tea/Caffeine-containing Drinks: Consumes coffee    PAST SURGICAL HISTORY:  Heart catheterization (03/2024)      Prior to Visit Medications   Medication Sig Taking? Authorizing Provider   amLODIPine  (NORVASC ) 5 MG tablet Take 1 tablet by mouth daily Yes Rojean, Reyes Rattler, MD   aspirin  81 MG chewable tablet Take 1 tablet by mouth daily Yes [provider]   atorvastatin  (LIPITOR) 80 MG tablet Take 1 tablet by mouth daily Yes Rojean, Reyes Rattler, MD   ezetimibe  (ZETIA ) 10 MG tablet Take 1 tablet by mouth daily Yes Rojean, Reyes Rattler, MD   gabapentin  (NEURONTIN ) 300 MG capsule Take 1 capsule by mouth 2 times daily for 180 days. Yes [name], MD   hydroCHLOROthiazide  (HYDRODIURIL ) 25 MG tablet Take 1 tablet by mouth daily Yes [name], MD   losartan  (COZAAR ) 100 MG tablet Take 1 tablet by mouth daily Yes [name], MD   sildenafil  (REVATIO ) 20 MG tablet Take 1 tablet by mouth daily as needed (ED; take 30 min prior to sexual activity) Yes [name], MD   Continuous Glucose Sensor (FREESTYLE LIBRE 3 PLUS SENSOR) MISC Change sensor every 15 days Yes Michael Longs, [place]   insulin  lispro, 1 Unit Dial , (HUMALOG  KWIKPEN) 100 UNIT/ML SOPN Take 6-8 units with breakfast and dinner. MAX TDD: 16 units Yes Isabeau Mccalla, [place], [place]   insulin  lispro (HUMALOG ,ADMELOG ) 100 UNIT/ML SOLN injection vial Max TDD 90 units for continuous SQ infusion with insulin  pump Yes Jene Huq, PA   ferrous sulfate  (IRON 325) 325 (65 Fe) MG tablet  Take 1 tablet by mouth daily (with breakfast) Yes [name], MD   FLUoxetine  (PROZAC ) 40 MG capsule Take 1 capsule by mouth daily Yes [name], MD   Insulin  Pen Needle 32G X 6 MM MISC Use one needle per injection -has 4 injections / day Yes [name], MD   insulin  glargine (LANTUS  SOLOSTAR) 100 UNIT/ML injection pen Inject 45-50 Units into the skin daily Yes Neziah Vogelgesang, PA   albuterol  sulfate HFA (VENTOLIN  HFA) 108 (90 Base) MCG/ACT  inhaler Inhale 2 puffs into the lungs 4 times daily as needed for Wheezing Yes [name], MD   Insulin  Pen Needle (PEN NEEDLES) 32G X 6 MM MISC 1 each by Does not apply route 2 times daily  Uzma Hellmer, [place], [place]     Social History     Tobacco Use    Smoking status: Former     Current packs/day: 0.00     Average packs/day: 1 pack/day for 14.3 years (14.3 ttl pk-yrs)     Types: Cigarettes     Start date: 03/09/2007     Quit date: 07/06/2021     Years since quitting: 2.7    Smokeless tobacco: Never   Substance Use Topics    Alcohol use: Not Currently    Drug use: Not Currently     Types: Methamphetamines (Crystal Meth)     Family History   Problem Relation Age of Onset    Diabetes Brother     Colon Cancer Maternal Grandmother     Diabetes Paternal Grandfather     Diabetes Paternal Uncle      ROS: As per HPI, all others negative  There were no vitals taken for this visit.   PHYSICAL EXAMINATION:  Vital Signs: (As obtained by patient/caregiver or practitioner observation)      02/13/2024     4:26 PM   Patient-Reported Vitals   Patient-Reported Weight 215   Patient-Reported Height 5 ft 9in   Patient-Reported Systolic 120 mmHg   Patient-Reported Diastolic 78 mmHg       Blood pressure-  Heart rate-    Respiratory rate-    Temperature-  Pulse oximetry-     Constitutional: [x]  Appears well-developed and well-nourished []  No apparent distress      []  Abnormal-   Mental status  [x]  Alert and awake  [x]  Oriented to person/place/time [x] Able to follow commands      Eyes:  EOM    [x]   Normal  []  Abnormal-  Sclera  [x]   Normal  []  Abnormal -         Discharge [x]   None visible  []  Abnormal -    HENT:   [x]  Normocephalic, atraumatic.  []  Abnormal   [x]  Mouth/Throat: Mucous membranes are moist.     External Ears [x]  Normal  []  Abnormal-     Neck: [x]  No visualized mass     Pulmonary/Chest: [x]  Respiratory effort normal.  [x]  No visualized signs of difficulty breathing or respiratory distress        []  Abnormal-      Musculoskeletal:   []   Normal gait with no signs of ataxia         [x]  Normal range of motion of neck        []  Abnormal-       Neurological:        [x]  No Facial Asymmetry (Cranial nerve 7 motor function) (limited exam to video visit)          [  x] No gaze palsy        []  Abnormal-         Skin:        [x]  No significant exanthematous lesions or discoloration noted on facial skin         []  Abnormal-            Psychiatric:       [x]  Normal Affect []  No Hallucinations        []  Abnormal-     Other pertinent observable physical exam findings-       Recent Results (from the past 4 weeks)   POCT Glucose    Collection Time: 03/21/24  1:58 PM   Result Value Ref Range    POC Glucose 272.0 (H) 70.0 - 120.0 mg/dL   Cardiac procedure    Collection Time: 03/21/24  3:33 PM   Result Value Ref Range    Body Surface Area 2.19 m2        Assessment & Plan  1. Type 1 Diabetes Mellitus:  - The patient reports a reduction in hypoglycemia incidents and is adjusting to the insulin  pump.  - Time in range is currently at 55%, with more high blood sugars than low. Predicted A1c is 7.6.  - Advised to maintain consistent bolusing with meals, avoid pausing the insulin  pump unnecessarily, and adhere to a low-carbohydrate diet.  - Encouraged to continue using the pump correctly and avoid manipulating the algorithm.    2. Cholesterol management:  - The patient is on high-dose Lipitor and Zetia  for cholesterol management.    3. Blood pressure management:  - The patient's blood pressure is managed with losartan  and amlodipine .    Follow-up: The patient will follow up in 3 months.       Attestation      1. 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 The patient (or guardian if applicable) is aware that this is a billable service, which includes applicable co-pays. This Virtual Visit was conducted with patient's (and/or legal guardian's) consent. Patient  identification was verified, and a caregiver was present when appropriate.   The patient was located at Home: [address]  [place] [address]  Provider was located at Home (Appt Dept State): [place]  Confirm you are appropriately licensed, registered, or certified to deliver care in the state where the patient is located as indicated above. If you are not or unsure, please re-schedule the visit: Yes, I confirm.       Return in about 3 months (around 07/10/2024) for follow up, glucose review.    Total time spent on this encounter: Not billed by time    The patient (or guardian, if applicable) and other individuals in attendance with the patient were advised that Artificial Intelligence will be utilized during this visit to record, process the conversation to generate a clinical note, and support improvement of the AI technology. The patient (or guardian, if applicable) and other individuals in attendance at the appointment consented to the use of AI, including the recording.      --Rocky Opal, PA on 04/12/2024 at 4:11 PM

## 2024-04-26 ENCOUNTER — Encounter: Attending: Family Medicine | Primary: Family Medicine
